# Patient Record
Sex: Male | Born: 1960 | Race: White | Hispanic: No | Smoking: Never smoker
Health system: Southern US, Community
[De-identification: ages and names within clinical notes are randomized; demographics above are authoritative.]

## PROBLEM LIST (undated history)

## (undated) DIAGNOSIS — R51 Headache: Secondary | ICD-10-CM

## (undated) DIAGNOSIS — K760 Fatty (change of) liver, not elsewhere classified: Secondary | ICD-10-CM

## (undated) DIAGNOSIS — H409 Unspecified glaucoma: Secondary | ICD-10-CM

## (undated) DIAGNOSIS — K219 Gastro-esophageal reflux disease without esophagitis: Secondary | ICD-10-CM

## (undated) DIAGNOSIS — R739 Hyperglycemia, unspecified: Secondary | ICD-10-CM

## (undated) DIAGNOSIS — M1990 Unspecified osteoarthritis, unspecified site: Secondary | ICD-10-CM

## (undated) DIAGNOSIS — F40240 Claustrophobia: Secondary | ICD-10-CM

## (undated) DIAGNOSIS — E785 Hyperlipidemia, unspecified: Secondary | ICD-10-CM

## (undated) DIAGNOSIS — H269 Unspecified cataract: Secondary | ICD-10-CM

## (undated) DIAGNOSIS — I10 Essential (primary) hypertension: Secondary | ICD-10-CM

## (undated) DIAGNOSIS — H543 Unqualified visual loss, both eyes: Secondary | ICD-10-CM

## (undated) DIAGNOSIS — R7303 Prediabetes: Secondary | ICD-10-CM

## (undated) DIAGNOSIS — C801 Malignant (primary) neoplasm, unspecified: Secondary | ICD-10-CM

## (undated) DIAGNOSIS — G4730 Sleep apnea, unspecified: Secondary | ICD-10-CM

## (undated) HISTORY — PX: FINGER SURGERY: SHX640

## (undated) HISTORY — DX: Sleep apnea, unspecified: G47.30

## (undated) HISTORY — DX: Hyperlipidemia, unspecified: E78.5

## (undated) HISTORY — DX: Fatty (change of) liver, not elsewhere classified: K76.0

## (undated) HISTORY — DX: Essential (primary) hypertension: I10

## (undated) HISTORY — DX: Unspecified cataract: H26.9

## (undated) HISTORY — DX: Hyperglycemia, unspecified: R73.9

## (undated) HISTORY — PX: EYE SURGERY: SHX253

---

## 2011-03-06 ENCOUNTER — Other Ambulatory Visit: Payer: Self-pay | Admitting: Ophthalmology

## 2011-03-06 ENCOUNTER — Encounter: Payer: Self-pay | Admitting: Ophthalmology

## 2011-03-08 ENCOUNTER — Other Ambulatory Visit: Payer: Self-pay

## 2011-03-08 ENCOUNTER — Encounter (HOSPITAL_COMMUNITY)
Admission: RE | Admit: 2011-03-08 | Discharge: 2011-03-08 | Disposition: A | Discharge status: Home / Self Care | Payer: BC Managed Care – PPO | Source: Ambulatory Visit | Attending: Ophthalmology | Admitting: Ophthalmology

## 2011-03-08 ENCOUNTER — Encounter (HOSPITAL_COMMUNITY): Payer: Self-pay | Admitting: Pharmacy Technician

## 2011-03-08 ENCOUNTER — Encounter (HOSPITAL_COMMUNITY): Payer: Self-pay

## 2011-03-08 HISTORY — DX: Unspecified glaucoma: H40.9

## 2011-03-08 HISTORY — DX: Headache: R51

## 2011-03-08 LAB — BASIC METABOLIC PANEL
Calcium: 9.5 mg/dL (ref 8.4–10.5)
Creatinine, Ser: 0.92 mg/dL (ref 0.50–1.35)
GFR calc Af Amer: >90 mL/min (ref >90)

## 2011-03-08 LAB — CBC
Hemoglobin: 14.9 g/dL (ref 13.0–17.0)
MCHC: 34.4 g/dL (ref 30.0–36.0)
RDW: 13.4 % (ref 11.5–15.5)
WBC: 8.6 10*3/uL (ref 4.0–10.5)

## 2011-03-08 LAB — SURGICAL PCR SCREEN
MRSA, PCR: NEGATIVE
Staphylococcus aureus: NEGATIVE

## 2011-03-08 MED ORDER — GATIFLOXACIN 0.5 % OP SOLN
1.0000 [drp] | OPHTHALMIC | Status: AC
  Administered 2011-03-09 (×3): 1 [drp] via OPHTHALMIC

## 2011-03-08 MED ORDER — TETRACAINE HCL 0.5 % OP SOLN
1.0000 [drp] | OPHTHALMIC | Status: AC
  Administered 2011-03-09 (×3): 1 [drp] via OPHTHALMIC
  Filled 2011-03-08: qty 2

## 2011-03-08 NOTE — Pre-Procedure Instructions (Signed)
[address]  03/08/2011   Your procedure is scheduled on:  03/09/11  Report to [facility] at 1;00 pm  Call this number if you have problems the morning of surgery: [phone]   Remember:   Do not eat food:After Midnight.  May have clear liquids: up to 4 Hours before arrival.  Clear liquids include soda, tea, black coffee, apple or grape juice, broth.  Take these medicines the morning of surgery with A SIP OF WATER: all eye drops   Do not wear jewelry, make-up or nail polish.  Do not wear lotions, powders, or perfumes. You may wear deodorant.  Do not shave 48 hours prior to surgery.  Do not bring valuables to the hospital.  Contacts, dentures or bridgework may not be worn into surgery.  Leave suitcase in the car. After surgery it may be brought to your room.  For patients admitted to the hospital, checkout time is 11:00 AM the day of discharge.   Patients discharged the day of surgery will not be allowed to drive home.  Name and phone number of your driver: family  Special Instructions: CHG Shower Use Special Wash: 1/2 bottle night before surgery and 1/2 bottle morning of surgery.   Please read over the following fact sheets that you were given: Pain Booklet, MRSA Information and Surgical Site Infection Prevention

## 2011-03-09 ENCOUNTER — Encounter (HOSPITAL_COMMUNITY): Payer: Self-pay | Admitting: *Deleted

## 2011-03-09 ENCOUNTER — Encounter (HOSPITAL_COMMUNITY): Payer: Self-pay | Admitting: Certified Registered"

## 2011-03-09 ENCOUNTER — Ambulatory Visit (HOSPITAL_COMMUNITY)
Admission: RE | Admit: 2011-03-09 | Discharge: 2011-03-09 | Disposition: A | Discharge status: Home / Self Care | Payer: BC Managed Care – PPO | Source: Ambulatory Visit | Attending: Ophthalmology | Admitting: Ophthalmology

## 2011-03-09 ENCOUNTER — Encounter (HOSPITAL_COMMUNITY)
Admission: RE | Disposition: A | Discharge status: Home / Self Care | Payer: Self-pay | Source: Ambulatory Visit | Attending: Ophthalmology

## 2011-03-09 ENCOUNTER — Ambulatory Visit (HOSPITAL_COMMUNITY): Payer: BC Managed Care – PPO | Admitting: Certified Registered"

## 2011-03-09 ENCOUNTER — Ambulatory Visit (HOSPITAL_COMMUNITY): Payer: BC Managed Care – PPO

## 2011-03-09 DIAGNOSIS — R51 Headache: Secondary | ICD-10-CM | POA: Insufficient documentation

## 2011-03-09 DIAGNOSIS — Z01810 Encounter for preprocedural cardiovascular examination: Secondary | ICD-10-CM | POA: Insufficient documentation

## 2011-03-09 DIAGNOSIS — I10 Essential (primary) hypertension: Secondary | ICD-10-CM | POA: Insufficient documentation

## 2011-03-09 DIAGNOSIS — H4010X0 Unspecified open-angle glaucoma, stage unspecified: Secondary | ICD-10-CM | POA: Insufficient documentation

## 2011-03-09 DIAGNOSIS — H4011X0 Primary open-angle glaucoma, stage unspecified: Secondary | ICD-10-CM

## 2011-03-09 DIAGNOSIS — Z01812 Encounter for preprocedural laboratory examination: Secondary | ICD-10-CM | POA: Insufficient documentation

## 2011-03-09 HISTORY — PX: MINI SHUNT INSERTION: SHX5337

## 2011-03-09 SURGERY — INSERTION OF MINI SHUNT
Anesthesia: Monitor Anesthesia Care | Site: Eye | Laterality: Left | Wound class: Clean

## 2011-03-09 SURGERY — INSERTION OF MINI SHUNT
Anesthesia: Monitor Anesthesia Care | Laterality: Left

## 2011-03-09 MED ORDER — HYALURONIDASE HUMAN 150 UNIT/ML IJ SOLN
INTRAMUSCULAR | Status: DC | PRN
  Administered 2011-03-09: 150 [IU] via SUBCUTANEOUS

## 2011-03-09 MED ORDER — FENTANYL CITRATE 0.05 MG/ML IJ SOLN
INTRAMUSCULAR | Status: DC | PRN
  Administered 2011-03-09 (×5): 25 ug via INTRAVENOUS
  Administered 2011-03-09: 100 ug via INTRAVENOUS
  Administered 2011-03-09: 25 ug via INTRAVENOUS

## 2011-03-09 MED ORDER — MITOMYCIN-C INJECTION USE IN OR ONLY (0.4 MG/ML)
INTRAVENOUS | Status: DC | PRN
  Administered 2011-03-09: .4 mg via OPHTHALMIC

## 2011-03-09 MED ORDER — PROVISC 10 MG/ML IO SOLN
INTRAOCULAR | Status: DC | PRN
  Administered 2011-03-09: 8.5 mg via INTRAOCULAR

## 2011-03-09 MED ORDER — PROPOFOL 10 MG/ML IV EMUL
INTRAVENOUS | Status: DC | PRN
  Administered 2011-03-09: 50 mg via INTRAVENOUS
  Administered 2011-03-09: 40 mg via INTRAVENOUS

## 2011-03-09 MED ORDER — SODIUM CHLORIDE 0.9 % IV SOLN
INTRAVENOUS | Status: DC | PRN
  Administered 2011-03-09 (×2): via INTRAVENOUS

## 2011-03-09 MED ORDER — BUPIVACAINE HCL 0.75 % IJ SOLN
INTRAMUSCULAR | Status: DC | PRN
  Administered 2011-03-09: 10 mL

## 2011-03-09 MED ORDER — BSS IO SOLN
INTRAOCULAR | Status: DC | PRN
  Administered 2011-03-09: 500 mL via INTRAOCULAR

## 2011-03-09 MED ORDER — MIDAZOLAM HCL 5 MG/5ML IJ SOLN
INTRAMUSCULAR | Status: DC | PRN
  Administered 2011-03-09: 2 mg via INTRAVENOUS

## 2011-03-09 MED ORDER — GATIFLOXACIN 0.5 % OP SOLN
OPHTHALMIC | Status: AC
  Administered 2011-03-09: 1 [drp] via OPHTHALMIC
  Filled 2011-03-09: qty 2.5

## 2011-03-09 MED ORDER — HEMOSTATIC AGENTS (NO CHARGE) OPTIME
TOPICAL | Status: DC | PRN
  Administered 2011-03-09: 1 via TOPICAL

## 2011-03-09 MED ORDER — ONDANSETRON HCL 4 MG/2ML IJ SOLN: 4.0000 mg | Freq: Once | INTRAMUSCULAR | Status: DC | PRN

## 2011-03-09 MED ORDER — TRIAMCINOLONE ACETONIDE 40 MG/ML IJ SUSP
INTRAMUSCULAR | Status: DC | PRN
  Administered 2011-03-09: .1 mL

## 2011-03-09 MED ORDER — SODIUM CHLORIDE 0.9 % IV SOLN
INTRAVENOUS | Status: DC
  Administered 2011-03-09: 15:00:00 via INTRAVENOUS

## 2011-03-09 MED ORDER — TETRACAINE HCL 0.5 % OP SOLN
OPHTHALMIC | Status: DC | PRN
  Administered 2011-03-09: 8 [drp] via OPHTHALMIC

## 2011-03-09 MED ORDER — FLUORESCEIN SODIUM 1 MG OP STRP
ORAL_STRIP | OPHTHALMIC | Status: DC | PRN
  Administered 2011-03-09: 1 via OPHTHALMIC

## 2011-03-09 MED ORDER — HYDROMORPHONE HCL PF 1 MG/ML IJ SOLN: 0.2500 mg | INTRAMUSCULAR | Status: DC | PRN

## 2011-03-09 MED ORDER — BSS IO SOLN
INTRAOCULAR | Status: DC | PRN
  Administered 2011-03-09: 15 mL via INTRAOCULAR

## 2011-03-09 MED ORDER — LIDOCAINE-EPINEPHRINE 2 %-1:100000 IJ SOLN
INTRAMUSCULAR | Status: DC | PRN
  Administered 2011-03-09: 10 mL

## 2011-03-09 MED ORDER — MITOMYCIN-C INJECTION USE IN OR ONLY (0.4 MG/ML): 0.5000 mL | Freq: Once | INTRAVENOUS | Status: DC

## 2011-03-09 SURGICAL SUPPLY — 51 items
APPLICATOR COTTON TIP 6IN STRL (MISCELLANEOUS) ×2 IMPLANT
APPLICATOR DR MATTHEWS STRL (MISCELLANEOUS) IMPLANT
BLADE EYE CATARACT 19 1.4 BEAV (BLADE) ×2 IMPLANT
BLADE STAB KNIFE 45DEG (BLADE) ×2 IMPLANT
BLADE SURG 15 STRL LF DISP TIS (BLADE) IMPLANT
BLADE SURG 15 STRL SS (BLADE)
CANISTER SUCTION 2500CC (MISCELLANEOUS) ×2 IMPLANT
CLOTH BEACON ORANGE TIMEOUT ST (SAFETY) ×2 IMPLANT
CORDS BIPOLAR (ELECTRODE) ×2 IMPLANT
DRAPE OPHTHALMIC 40X48 W POUCH (DRAPES) ×2 IMPLANT
DRAPE RETRACTOR (MISCELLANEOUS) ×2 IMPLANT
ERASER HMR WETFIELD 23G BP (MISCELLANEOUS) ×2 IMPLANT
EYE SHIELD UNIVERSAL CLEAR (GAUZE/BANDAGES/DRESSINGS) ×2 IMPLANT
GLOVE BIO SURGEON STRL SZ8 (GLOVE) ×2 IMPLANT
GLOVE BIO SURGEON STRL SZ8.5 (GLOVE) IMPLANT
GLOVE ECLIPSE 7.0 STRL STRAW (GLOVE) ×2 IMPLANT
GLOVE ECLIPSE 7.5 STRL STRAW (GLOVE) ×2 IMPLANT
GLOVE SURG SS PI 6.5 STRL IVOR (GLOVE) ×4 IMPLANT
GOWN PREVENTION PLUS XLARGE (GOWN DISPOSABLE) ×2 IMPLANT
GOWN STRL NON-REIN LRG LVL3 (GOWN DISPOSABLE) ×6 IMPLANT
KIT BASIN OR (CUSTOM PROCEDURE TRAY) ×2 IMPLANT
KIT ROOM TURNOVER OR (KITS) ×2 IMPLANT
KNIFE GRIESHABER SHARP 2.5MM (MISCELLANEOUS) ×2 IMPLANT
MARKER SKIN DUAL TIP RULER LAB (MISCELLANEOUS) IMPLANT
NEEDLE 22X1 1/2 (OR ONLY) (NEEDLE) ×2 IMPLANT
NEEDLE 25GX 5/8IN NON SAFETY (NEEDLE) IMPLANT
NEEDLE HYPO 23GX1 LL BLUE HUB (NEEDLE) IMPLANT
NEEDLE HYPO 30X.5 LL (NEEDLE) ×2 IMPLANT
NS IRRIG 1000ML POUR BTL (IV SOLUTION) ×2 IMPLANT
PACK CATARACT CUSTOM (CUSTOM PROCEDURE TRAY) ×2 IMPLANT
PAD ARMBOARD 7.5X6 YLW CONV (MISCELLANEOUS) ×2 IMPLANT
PAD EYE OVAL STERILE LF (GAUZE/BANDAGES/DRESSINGS) ×2 IMPLANT
SHUNT EXPRS GLAUCOMA MINI P200 (Intraocular Lens) ×2 IMPLANT
SPEAR EYE SURG WECK-CEL (MISCELLANEOUS) IMPLANT
SPECIMEN JAR SMALL (MISCELLANEOUS) IMPLANT
SPONGE SURGIFOAM ABS GEL 12-7 (HEMOSTASIS) ×4 IMPLANT
SUT ETHILON 10 0 CS140 6 (SUTURE) ×2 IMPLANT
SUT ETHILON 9 0 BV100 4 (SUTURE) ×2 IMPLANT
SUT ETHILON 9 0 TG140 8 (SUTURE) IMPLANT
SUT MERSILENE 6 0 S14 DA (SUTURE) IMPLANT
SUT SILK 6 0 G 6 (SUTURE) ×2 IMPLANT
SUT VICRYL 8 0 TG140 8 (SUTURE) ×2 IMPLANT
SUT VICRYL 9-0 (SUTURE) ×2 IMPLANT
SYR 50ML SLIP (SYRINGE) ×2 IMPLANT
SYR TB 1ML LUER SLIP (SYRINGE) IMPLANT
TAPE SURG TRANSPORE 1 IN (GAUZE/BANDAGES/DRESSINGS) ×1 IMPLANT
TAPE SURGICAL TRANSPORE 1 IN (GAUZE/BANDAGES/DRESSINGS) ×1
TOWEL OR 17X24 6PK STRL BLUE (TOWEL DISPOSABLE) ×4 IMPLANT
TUBE CONNECTING 12X1/4 (SUCTIONS) IMPLANT
WATER STERILE IRR 1000ML POUR (IV SOLUTION) ×2 IMPLANT
WIPE INSTRUMENT VISIWIPE 73X73 (MISCELLANEOUS) ×2 IMPLANT

## 2011-03-09 NOTE — Op Note (Signed)
Operative note Preoperative diagnosis: Uncontrolled open-angle glaucoma left eye severely elevated intraocular  pressure measured 54 mmHg yesterday Postoperative diagnosis: Same Procedure express mini -shunt with mitomycin-C left eye Complications: None Asst. Mindy Anesthesia: 2% Xylocaine in a 50-50 mixture of 0.75% Marcaine with Wydase Procedure: The patient was taken to the operating room where he was given a peribulbar block with the aforementioned local anesthetic agent. Following this the patient's face was prepped and draped in the usual sterile fashion. With the surgeons in superiorly and the operating microscope in position with 6-0 nylon suture was passed through clear cornea for about the eye. It was noted that the patient's conjunctiva was closed injected an incision was made at the superior nasal limbus using the Wescott scissors and Bishop Harmon forceps. The block sutures were used to dissect to form a fornix based conjunctival flap blade was used to assess the non-fibers. Bleeding was controlled with cautery a half-thickness scleral flap was fashioned with a 45 blade the bruit forceps were used to elevate the flap and a 700 blade was used to dissect to the limbus following this mitomycin-C 0.4 mg as per cc was placed on Gelfoam sponge allowed to stay on the conjunctiva for 2 minutes and then irrigated with 50 cc of balanced salt solution. Following thisa paracentesis was and at the 4:00 position and Provisc was injected into the anterior chamber. The scleral flap was elevated and a 26-gauge needle was passed into the anterior chamber at the by injecting progress. We expressed. Preloaded P.-200 was then passed into the anterior chamber and positioned the scleral flap was then sutured with 4 interrupted 10-0 nylon sutures. The conjunctiva was then closed with 1 9-0 nylon suture and a lateral Vicryl suture on a 100 needle to achieve watertight closure appeared BSS was then injected into the  anterior chamber the bleb elevated in all incision areas were Seidel negative. A subconjunctival injection of Kenalog 4 mg was given in the inferior subconjunctival. The 6-0 nylon suture was removed all instrumentation was removed from the eye. Topical TobraDex ointment was applied. A patch and Fox your were placed and the patient returned to recovery area in stable condition Complications none

## 2011-03-09 NOTE — H&P (Signed)
Pre-operative History and Physical for Ophthalmic Surgery  Patient Name: Jeff [name]  Date of H&P: 03/09/2011   Chief Complaint: Elevated IOP OS  Diagnosis: Primary open angle glaucoma uncontrolled left eye  No Known Allergies  Planned procedure:  Minitube express shunt with [facility]MC OS  ROS: Review of systems is negative except as noted Hypertension, arthritis,  migraine   Past Medical History  Diagnosis Date  . Hypertension     no meds  . Headache   . Glaucoma (increased eye pressure)      Past Surgical History  Procedure Date  . No past surgeries       History   Social History  . Marital Status: Married    Spouse Name: N/A    Number of Children: N/A  . Years of Education: N/A   Occupational History  . Not on file.   Social History Main Topics  . Smoking status: Never Smoker   . Smokeless tobacco: Not on file  . Alcohol Use: 3.0 oz/week    5 Cans of beer per week     daily  . Drug Use: No  . Sexually Active:    Other Topics Concern  . Not on file   Social History Narrative  . No narrative on file    PHYSICAL EXAM  The following examination is for anesthesia clearance for minimally invasive Ophthalmic surgery. It is primarily to document heart and lung findings and is not intended to elucidate unknown general medical conditions inclusive of abdominal masses, lung lesions, etc.   Filed Vitals:   03/09/11 1311  BP: 167/111  Pulse: 85  Temp: 97.8 F (36.6 C)  Resp: 22    General Constitution: Well-nourished white male, oriented to time place and person with normal mood   HEENT: -Right Eye Findings: va 20/25  Ta 26  Optic nerve 80% cupped SLE normal -Left Eye Findings: VA 20/70   Ta 54  + corneal edema , optic nerve 90% cupped, macula normal   Neck: Supple no masses   Chest/Lungs: Clear to auscultation and percussion   Cardiac: Regular rhythm rate with no murmur   Abdominal: Soft normal bowel sounds   Neuro: All cranial nerves intact    Other pertinent findings: None   IMPRESSION: Uncontrolled glaucoma left eye despite maxium tolerated medical and laser therapy  PLANNED PROCEDURE: Express min-tube Left Eye with [facility]    Harlon Flor, Honestii Marton, MD 03/09/2011 1:52 PM

## 2011-03-09 NOTE — Preoperative (Signed)
Beta Blockers   Reason not to administer Beta Blockers:Not Applicable 

## 2011-03-09 NOTE — Anesthesia Postprocedure Evaluation (Signed)
  Anesthesia Post-op Note  Patient: Jeff [name]  Procedure(s) Performed:  INSERTION OF MINI SHUNT  Patient Location: PACU  Anesthesia Type: MAC  Level of Consciousness: awake, alert , oriented and patient cooperative  Airway and Oxygen Therapy: Patient Spontanous Breathing and Patient connected to nasal cannula oxygen  Post-op Pain: none  Post-op Assessment: Post-op Vital signs reviewed, Patient's Cardiovascular Status Stable, Respiratory Function Stable, Patent Airway, No signs of Nausea or vomiting and Pain level controlled  Post-op Vital Signs: stable  Complications: No apparent anesthesia complications

## 2011-03-09 NOTE — Transfer of Care (Signed)
Immediate Anesthesia Transfer of Care Note  Patient: Jeff [name]  Procedure(s) Performed:  INSERTION OF MINI SHUNT  Patient Location: Short Stay  Anesthesia Type: MAC  Level of Consciousness: awake, alert , oriented and patient cooperative  Airway & Oxygen Therapy: Patient Spontanous Breathing  Post-op Assessment: Report given to PACU RN, Post -op Vital signs reviewed and stable and Patient moving all extremities  Post vital signs: Reviewed and stable Filed Vitals:   03/09/11 1330  BP: 159/105  Pulse:   Temp:   Resp:     Complications: No apparent anesthesia complications

## 2011-03-09 NOTE — Anesthesia Preprocedure Evaluation (Addendum)
Anesthesia Evaluation  Patient identified by MRN, date of birth, ID band Patient awake    Reviewed: Allergy & Precautions, H&P , NPO status , Patient's Chart, lab work & pertinent test results, reviewed documented beta blocker date and time   Airway Mallampati: I TM Distance: >3 FB Neck ROM: Full  Mouth opening: Limited Mouth Opening  Dental  (+) Teeth Intact   Pulmonary neg pulmonary ROS,          Cardiovascular hypertension, regular Normal No longer being treated for HTN   Neuro/Psych  Headaches, Negative Psych ROS   GI/Hepatic negative GI ROS, Neg liver ROS,   Endo/Other  Negative Endocrine ROS  Renal/GU negative Renal ROS  Genitourinary negative   Musculoskeletal   Abdominal   Peds  Hematology negative hematology ROS (+)   Anesthesia Other Findings   Reproductive/Obstetrics                          Anesthesia Physical Anesthesia Plan  ASA: II  Anesthesia Plan: MAC   Post-op Pain Management:    Induction: Intravenous  Airway Management Planned: Mask  Additional Equipment:   Intra-op Plan:   Post-operative Plan:   Informed Consent: I have reviewed the patients History and Physical, chart, labs and discussed the procedure including the risks, benefits and alternatives for the proposed anesthesia with the patient or authorized representative who has indicated his/her understanding and acceptance.     Plan Discussed with: Anesthesiologist, CRNA and Surgeon  Anesthesia Plan Comments:         Anesthesia Quick Evaluation

## 2011-03-09 NOTE — Progress Notes (Signed)
Spoke with Shonna Chock PA regarding EKG and hx of HTN-BP at preop and twice checked today. Order put in for CXR.  Patient states he had EKG done in 2011 at [facility]- will try to call and get this faxed.

## 2011-03-14 ENCOUNTER — Encounter (HOSPITAL_COMMUNITY): Payer: Self-pay | Admitting: Ophthalmology

## 2011-03-20 ENCOUNTER — Other Ambulatory Visit: Payer: Self-pay | Admitting: Ophthalmology

## 2011-03-22 ENCOUNTER — Ambulatory Visit (INDEPENDENT_AMBULATORY_CARE_PROVIDER_SITE_OTHER): Payer: BC Managed Care – PPO | Admitting: Family Medicine

## 2011-03-22 VITALS — BP 149/102 | HR 71 | Temp 98.2°F | Resp 16 | Ht 70.0 in | Wt 179.0 lb

## 2011-03-22 DIAGNOSIS — S01501A Unspecified open wound of lip, initial encounter: Secondary | ICD-10-CM

## 2011-03-22 DIAGNOSIS — H409 Unspecified glaucoma: Secondary | ICD-10-CM | POA: Insufficient documentation

## 2011-03-22 DIAGNOSIS — Z23 Encounter for immunization: Secondary | ICD-10-CM

## 2011-03-22 DIAGNOSIS — I10 Essential (primary) hypertension: Secondary | ICD-10-CM

## 2011-03-22 DIAGNOSIS — S01511A Laceration without foreign body of lip, initial encounter: Secondary | ICD-10-CM

## 2011-03-22 MED ORDER — DOXYCYCLINE HYCLATE 100 MG PO TABS: 100.0000 mg | ORAL_TABLET | Freq: Two times a day (BID) | ORAL | Status: AC

## 2011-03-22 MED ORDER — TRAMADOL HCL 50 MG PO TABS: 50.0000 mg | ORAL_TABLET | Freq: Three times a day (TID) | ORAL | Status: DC | PRN

## 2011-03-22 NOTE — Progress Notes (Signed)
  Subjective:    Patient ID: Jeff [name], male    DOB: 17-Jan-1961, 51 y.o.   MRN: [id number]  HPI   Dog bite to lower lip last night at 5:30 pm.  Overnight with swelling. Unknown Tdap status.    Review of Systems No drainage from wound    Objective:   Physical Exam  Constitutional: He appears well-developed.  HENT:  Mouth/Throat: Oropharynx is clear and moist.    Cardiovascular: Normal rate and regular rhythm.   Pulmonary/Chest: Effort normal and breath sounds normal.          Assessment & Plan:   1. Laceration of lip  doxycycline (VIBRA-TABS) 100 MG tablet  2. HTN (hypertension)     Wound care sheet provided. Follow up for suture removal 5 days.

## 2011-03-22 NOTE — Patient Instructions (Signed)
Open Wound, Lip  Return to clinic in 5 days for suture removal, sooner if worse.  An open wound is a break in the skin caused by an injury. An open wound to the lip can be a scrape, cut, or hole (puncture). Good wound care will help:   Lessen pain.   Prevent infection.   Lessen scarring.  HOME CARE  Wash off all dirt.   Clean your wounds daily with gentle soap and water.   Eat soft foods or liquids while your wound is healing.   Rinse the wound with salt water after each meal.   Apply medicated cream after the wound has been cleaned as told by your doctor.   Follow up with your doctor as told.  GET HELP RIGHT AWAY IF:   There is more redness or puffiness (swelling) in or around the wound.   There is increasing pain.   You have a temperature by mouth above 102 F (38.9 C), not controlled by medicine.   Your baby is older than 3 months with a rectal temperature of 102 F (38.9 C) or higher.   Your baby is 43 months old or younger with a rectal temperature of 100.4 F (38 C) or higher.   There is yellowish white fluid (pus) coming from the wound.   Very bad pain develops that is not controlled with medicine.   There is a red line on the skin above or below the wound.  MAKE SURE YOU:   Understand these instructions.   Will watch this condition.   Will get help right away if you are not doing well or get worse.  Document Released: 05/05/2008 Document Revised: 10/19/2010 Document Reviewed: 05/25/2009 [facility] Patient Information 2012 [place], [place].

## 2011-03-22 NOTE — Progress Notes (Signed)
   Patient ID: Jeff [name] MRN: [id number], DOB: 1960-04-28, 51 y.o. Date of Encounter: 03/22/2011, 4:46 PM  The following procedure performed and documented by: Eula Listen, PA-C.  PROCEDURE NOTE: Verbal consent obtained. Sterile technique employed. Numbing: Anesthesia obtained with 2% plain lidocaine 3 cc total. Cleansed with soap and water. Irrigated. Betadine prep per usual protocol.  Wound explored, no deep structures involved, no foreign bodies.   Wound repaired with # 7 SI 7-0 Ethilon. Hemostasis obtained. Wound cleansed. Wound care instructions including precautions covered with patient. Handout given.  Anticipate suture removal in 5 days.  Elinor Dodge, PA-C 03/22/2011

## 2011-03-27 ENCOUNTER — Encounter: Payer: Self-pay | Admitting: Family Medicine

## 2011-03-27 ENCOUNTER — Ambulatory Visit (INDEPENDENT_AMBULATORY_CARE_PROVIDER_SITE_OTHER): Payer: BC Managed Care – PPO | Admitting: Family Medicine

## 2011-03-27 VITALS — BP 154/96 | HR 80 | Temp 98.4°F | Resp 16

## 2011-03-27 DIAGNOSIS — S01501A Unspecified open wound of lip, initial encounter: Secondary | ICD-10-CM | POA: Insufficient documentation

## 2011-03-27 DIAGNOSIS — S0180XA Unspecified open wound of other part of head, initial encounter: Secondary | ICD-10-CM

## 2011-03-27 NOTE — Progress Notes (Signed)
  Subjective:    Patient ID: Jeff [name], male    DOB: October 05, 1960, 51 y.o.   MRN: [id number]  HPI It is here to have his sutures removed. He had been put in last Wednesday after his dog clawed his lower lip. It was not a malicious event.   Review of Systems It is hurts him no major complaint no associated problem    Objective:   Physical Exam    Right age of the lower lip has a eschar  on it. The patient says they're supposed to be 7 sutures in there. These were searched for. I was able to remove for sutures and may be a fifth. It is a little hard to tell because everything is so embedded in the residual scab. The wound on the buccal mucosa aspect is healing with a fairly wide gap, but has new tissue growing. The scab which is on the outer aspect is still tightly adherent. I cautioned him that to pull the scab loose would pull the stitches out..    Assessment & Plan:  Wound lower lip. He is to soak it and tried work the edges of the scab loose. He is then to return Thursday or Friday for one of [place] to try to remove the remaining sutures. He is to ask for either Eula Listen or myself.  Note that his blood pressure was on this visit and needs to be rechecked when he comes in

## 2011-03-31 ENCOUNTER — Ambulatory Visit (INDEPENDENT_AMBULATORY_CARE_PROVIDER_SITE_OTHER): Payer: BC Managed Care – PPO | Admitting: Physician Assistant

## 2011-03-31 VITALS — BP 156/104 | HR 77 | Temp 98.1°F | Resp 16

## 2011-03-31 DIAGNOSIS — Z4802 Encounter for removal of sutures: Secondary | ICD-10-CM

## 2011-03-31 NOTE — Progress Notes (Signed)
Patient ID: Jeff [name] MRN: [id number], DOB: 07-10-1960 51 y.o. Date of Encounter: 03/31/2011, 3:02 PM  Primary Physician: No primary provider on file.  Chief Complaint: Suture removal    See note from 1/30 and 2/4  HPI: 51 y.o. y/o male with injury to lower lip from his dog. Not malicious.  Here for suture removal s/p placement on 03/22/11 Had # 5 sutures removed by Dr. Alwyn Ren on 03/27/11. The remaining 2 sutures were underneath eschar. Since the visit on 2/4 the patient hs been removing the eschar at home.  Doing well No issues/complaints Afebrile/ No chills No erythema No pain Able to move without difficulty Normal sensation  Attempted to discuss BP issues today. Patient however states his wife is waiting for him in the waiting room and they have friends coming over. He is asymptomatic. Declines both repeat BP check today, and evaluation and treatment of his elevated BP.   Past Medical History  Diagnosis Date  . Hypertension     no meds  . Headache   . Glaucoma (increased eye pressure)      Home Meds: Prior to Admission medications   Medication Sig Start Date End Date Taking? Authorizing Provider  brimonidine-timolol (COMBIGAN) 0.2-0.5 % ophthalmic solution Place 1 drop into both eyes every 12 (twelve) hours.   Yes Historical Provider, MD  brinzolamide (AZOPT) 1 % ophthalmic suspension Place 1 drop into both eyes 3 (three) times daily.   Yes Historical Provider, MD  doxycycline (VIBRA-TABS) 100 MG tablet Take 1 tablet (100 mg total) by mouth 2 (two) times daily. 03/22/11 04/01/11 Yes Karen L. Hal Hope, MD  gatifloxacin (ZYMAXID) 0.5 % SOLN Apply 1 drop to eye 4 (four) times daily.   Yes Historical Provider, MD  ibuprofen (ADVIL,MOTRIN) 200 MG tablet Take 500 mg by mouth 2 (two) times daily as needed. For headache   Yes Historical Provider, MD  ibuprofen (ADVIL,MOTRIN) 600 MG tablet Take 600 mg by mouth 3 (three) times daily.   Yes Historical Provider, MD  Multiple  Vitamins-Minerals (MULTIVITAMINS THER. W/MINERALS) TABS Take 1 tablet by mouth daily.   Yes Historical Provider, MD  prednisoLONE acetate (PRED FORTE) 1 % ophthalmic suspension Place 1 drop into both eyes 4 (four) times daily.   Yes Historical Provider, MD  traMADol (ULTRAM) 50 MG tablet Take 1 tablet (50 mg total) by mouth every 8 (eight) hours as needed for pain. 03/22/11 04/01/11 Yes Karen L. Hal Hope, MD  HYDROcodone-acetaminophen (NORCO) 10-325 MG per tablet Take 1 tablet by mouth as needed. Pt taking 1 tablet QHS PRN    Historical Provider, MD    Allergies: No Known Allergies  Physical Exam: Blood pressure 156/104, pulse 77, temperature 98.1 F (36.7 C), resp. rate 16. General: Well developed, well nourished, in no acute distress. Head: Normocephalic, atraumatic, sclera non-icteric, no xanthomas, nares are without discharge.  Neck: Supple. Lungs: Breathing is unlabored. Heart: Normal rate. Msk:  Strength and tone appear normal for age. Wound: Lower lip wound well healed without erythema, swelling, or tenderness to palpation. No signs of secondary infection. Skin: See above, otherwise dry without rash or erythema. Extremities: No clubbing or cyanosis. No edema. Neuro: Alert and oriented X 3. Moves all extremities spontaneously.  Psych:  Responds to questions appropriately with a normal affect.   PROCEDURE: Verbal consent obtained. # 2 sutures removed without difficulty. Wound examined revealing no further sutures in place.  Assessment and Plan: 51 y.o. y/o male here for suture removal for wound described above and elevated  BP readings. -Sutures removed per above -Wound resolved -Advised patient of the risks of untreated HTN. He declines evaluation and treatment. Advised patient to have BP checks at local stores, if >140/90 needs to RTC for evaluation and treatment.   Elinor Dodge, PA-C 03/31/2011 3:10 PM

## 2011-10-15 ENCOUNTER — Ambulatory Visit: Payer: BC Managed Care – PPO

## 2011-10-15 ENCOUNTER — Ambulatory Visit (INDEPENDENT_AMBULATORY_CARE_PROVIDER_SITE_OTHER): Payer: BC Managed Care – PPO | Admitting: Family Medicine

## 2011-10-15 VITALS — BP 170/105 | HR 89 | Temp 98.0°F | Resp 16 | Ht 71.0 in | Wt 187.0 lb

## 2011-10-15 DIAGNOSIS — S0990XA Unspecified injury of head, initial encounter: Secondary | ICD-10-CM

## 2011-10-15 DIAGNOSIS — M25519 Pain in unspecified shoulder: Secondary | ICD-10-CM

## 2011-10-15 DIAGNOSIS — M25539 Pain in unspecified wrist: Secondary | ICD-10-CM

## 2011-10-15 DIAGNOSIS — S63259A Unspecified dislocation of unspecified finger, initial encounter: Secondary | ICD-10-CM

## 2011-10-15 DIAGNOSIS — M542 Cervicalgia: Secondary | ICD-10-CM

## 2011-10-15 DIAGNOSIS — M79646 Pain in unspecified finger(s): Secondary | ICD-10-CM

## 2011-10-15 DIAGNOSIS — I10 Essential (primary) hypertension: Secondary | ICD-10-CM

## 2011-10-15 DIAGNOSIS — S6990XA Unspecified injury of unspecified wrist, hand and finger(s), initial encounter: Secondary | ICD-10-CM

## 2011-10-15 DIAGNOSIS — M109 Gout, unspecified: Secondary | ICD-10-CM

## 2011-10-15 DIAGNOSIS — M79609 Pain in unspecified limb: Secondary | ICD-10-CM

## 2011-10-15 MED ORDER — LISINOPRIL-HYDROCHLOROTHIAZIDE 10-12.5 MG PO TABS: 1.0000 | ORAL_TABLET | Freq: Every day | ORAL | Status: DC

## 2011-10-15 MED ORDER — IBUPROFEN 600 MG PO TABS: 600.0000 mg | ORAL_TABLET | Freq: Three times a day (TID) | ORAL | Status: DC

## 2011-10-15 MED ORDER — HYDROCODONE-ACETAMINOPHEN 10-325 MG PO TABS: 1.0000 | ORAL_TABLET | Freq: Four times a day (QID) | ORAL | Status: DC | PRN

## 2011-10-15 NOTE — Progress Notes (Signed)
[facility]:  Office Visit  Chief Complaint:  Chief Complaint  Patient presents with  . Head Injury    patient fell down stairs lastnight around 1am.  . Wrist Injury    Right  . Hand Injury    Right    HPI: Jeff [name] is a 51 y.o. male who complains of diffuse body pain. Was at a birthday party at cousin. Stayed overnight. Went to restroom and opened wrong door. Tumbled down basement stairs. Fell down 15  stairs around 1 am this morning. Does not know how he landed. 1. Denies loss of consciousness. Did hit head. His whole head hurts. No confusion or disorientation. + HA.  2. Neck pain with ROM, especially with SB of neck to upper back right and left, sharp pain with SB. Flexion pain at base of skull 3. Bilateral shoulder pain, constant dull 4. Right wrist pain 6/10 sharp pain and right 3rd and 4th finger 8/10 pain. Buddy taped with some relief.    Past Medical History  Diagnosis Date  . Hypertension     no meds  . Headache   . Glaucoma (increased eye pressure)    Past Surgical History  Procedure Date  . No past surgeries   . Mini shunt insertion 03/09/2011    Procedure: INSERTION OF MINI SHUNT;  Surgeon: Chalmers Guest, MD;  Location: [facility] OR;  Service: Ophthalmology;  Laterality: Left;   History   Social History  . Marital Status: Married    Spouse Name: N/A    Number of Children: N/A  . Years of Education: N/A   Social History Main Topics  . Smoking status: Never Smoker   . Smokeless tobacco: None  . Alcohol Use: 3.0 oz/week    5 Cans of beer per week     daily  . Drug Use: No  . Sexually Active:    Other Topics Concern  . None   Social History Narrative  . None   No family history on file. No Known Allergies Prior to Admission medications   Medication Sig Start Date End Date Taking? Authorizing Provider  brimonidine-timolol (COMBIGAN) 0.2-0.5 % ophthalmic solution Place 1 drop into both eyes every 12 (twelve) hours.   Yes Historical  Provider, MD  brinzolamide (AZOPT) 1 % ophthalmic suspension Place 1 drop into both eyes 3 (three) times daily.   Yes Historical Provider, MD  gatifloxacin (ZYMAXID) 0.5 % SOLN Apply 1 drop to eye 4 (four) times daily.   Yes Historical Provider, MD  ibuprofen (ADVIL,MOTRIN) 600 MG tablet Take 600 mg by mouth 3 (three) times daily.   Yes Historical Provider, MD  Multiple Vitamins-Minerals (MULTIVITAMINS THER. W/MINERALS) TABS Take 1 tablet by mouth daily.   Yes Historical Provider, MD  prednisoLONE acetate (PRED FORTE) 1 % ophthalmic suspension Place 1 drop into both eyes 4 (four) times daily.   Yes Historical Provider, MD  HYDROcodone-acetaminophen (NORCO) 10-325 MG per tablet Take 1 tablet by mouth as needed. Pt taking 1 tablet QHS PRN    Historical Provider, MD  ibuprofen (ADVIL,MOTRIN) 200 MG tablet Take 500 mg by mouth 2 (two) times daily as needed. For headache    Historical Provider, MD     ROS: The patient denies fevers, chills, night sweats, unintentional weight loss, chest pain, palpitations, wheezing, dyspnea on exertion, nausea, vomiting, abdominal pain, dysuria, hematuria, melena, numbness, weakness, or tingling.  All other systems have been reviewed and were otherwise negative with the exception of those mentioned in  the HPI and as above.    PHYSICAL EXAM: Filed Vitals:   10/15/11 1513  BP: 170/105  Pulse: 89  Temp: 98 F (36.7 C)  Resp: 16   Filed Vitals:   10/15/11 1513  Height: 5\' 11"  (1.803 m)  Weight: 187 lb (84.823 kg)   Body mass index is 26.08 kg/(m^2).  General: Alert, no acute distress HEENT:  Normocephalic, atraumatic, oropharynx patent.  Cardiovascular:  Regular rate and rhythm, no rubs murmurs or gallops.  No Carotid bruits, radial pulse intact. No pedal edema.  Respiratory: Clear to auscultation bilaterally.  No wheezes, rales, or rhonchi.  No cyanosis, no use of accessory musculature GI: No organomegaly, abdomen is soft and non-tender, positive bowel  sounds.  No masses. Skin: No rashes. Neurologic: Facial musculature symmetric. Psychiatric: Patient is appropriate throughout our interaction. Lymphatic: No cervical lymphadenopathy Musculoskeletal: Gait intact. Neck-pain along trapezius and paraspinal cervical msk Shoulder-tender on palpation Neg spurling ROM intact but painful with full SB and lateral rotation Wrist -tender with flexion and extension, + radial pulse, ROM intact, senstationa d strength intact Right 4th finger-5/5 strength, sensation intact, decrease ROM due to sublux   LABS: Results for orders placed during the hospital encounter of 03/08/11  CBC      Component Value Range   WBC 8.6  4.0 - 10.5 K/uL   RBC 4.60  4.22 - 5.81 MIL/uL   Hemoglobin 14.9  13.0 - 17.0 g/dL   HCT 40.9  81.1 - 91.4 %   MCV 94.1  78.0 - 100.0 fL   MCH 32.4  26.0 - 34.0 pg   MCHC 34.4  30.0 - 36.0 g/dL   RDW 78.2  95.6 - 21.3 %   Platelets 211  150 - 400 K/uL  SURGICAL PCR SCREEN      Component Value Range   MRSA, PCR NEGATIVE  NEGATIVE   Staphylococcus aureus NEGATIVE  NEGATIVE  BASIC METABOLIC PANEL      Component Value Range   Sodium 138  135 - 145 mEq/L   Potassium 4.1  3.5 - 5.1 mEq/L   Chloride 109  96 - 112 mEq/L   CO2 22  19 - 32 mEq/L   Glucose, Bld 90  70 - 99 mg/dL   BUN 19  6 - 23 mg/dL   Creatinine, Ser 0.86  0.50 - 1.35 mg/dL   Calcium 9.5  8.4 - 57.8 mg/dL   GFR calc non Af Amer >90  >90 mL/min   GFR calc Af Amer >90  >90 mL/min     EKG/XRAY:   Primary read interpreted by Dr. Conley Rolls at [facility]. Skull-negative for fracture Neck-? C4-5 acute vs chronic changes, no fractures or dislocation Shoulder-no fx or dislocation Finger-right ring finger PIP dislocation Wrist-no fx or dislocation   ASSESSMENT/PLAN: Encounter Diagnoses  Name Primary?  . Head injury Yes  . Neck pain   . Shoulder pain   . Wrist pain   . Finger pain   . Gout   . HTN (hypertension)     1. Right ring finger dislocation at PIP-gave 2%  lidocaine without epi for local block, attempted close reduction but failed.  2. Refill HTN medication-Lisinopril-  HCTZ 10/12.5 mg 3. Follow-up on uric acid. Consider allopurinol for maintenace med since keeps getting gouty flare-up with high normal values  Needs referral to [employer], Jevante Hollibaugh PHUONG, DO 10/15/2011 4:33 PM

## 2011-10-16 ENCOUNTER — Telehealth: Payer: Self-pay

## 2011-10-16 LAB — BASIC METABOLIC PANEL WITH GFR
BUN: 14 mg/dL (ref 6–23)
CO2: 21 meq/L (ref 19–32)
Chloride: 106 meq/L (ref 96–112)
Potassium: 4.2 meq/L (ref 3.5–5.3)

## 2011-10-16 LAB — BASIC METABOLIC PANEL
Calcium: 9.4 mg/dL (ref 8.4–10.5)
Creat: 0.85 mg/dL (ref 0.50–1.35)
Glucose, Bld: 100 mg/dL — ABNORMAL HIGH (ref 70–99)
Sodium: 140 mEq/L (ref 135–145)

## 2011-10-16 LAB — URIC ACID: Uric Acid, Serum: 8.7 mg/dL — ABNORMAL HIGH (ref 4.0–7.8)

## 2011-10-16 NOTE — Telephone Encounter (Signed)
Have given the information to xray to do this for him

## 2011-10-16 NOTE — Telephone Encounter (Signed)
PT HAS AN APPOINTMENT WITH THE [facility] IN THE MORNING THAT WE REFERRED HIM TO.  HE NEEDS A COPY OF THE XRAYS ON HIS FINGER.  HIS WIFE WILL BE BY THIS EVENING TO PICK IT UP.  [phone]

## 2011-10-17 NOTE — Telephone Encounter (Signed)
Amy stated that [facility] copied xray for pt last night

## 2011-10-18 ENCOUNTER — Telehealth: Payer: Self-pay | Admitting: Family Medicine

## 2011-10-18 ENCOUNTER — Other Ambulatory Visit: Payer: Self-pay | Admitting: Family Medicine

## 2011-10-18 DIAGNOSIS — M109 Gout, unspecified: Secondary | ICD-10-CM

## 2011-10-18 MED ORDER — COLCHICINE 0.6 MG PO TABS: 0.6000 mg | ORAL_TABLET | Freq: Two times a day (BID) | ORAL | Status: DC

## 2011-10-18 NOTE — Telephone Encounter (Signed)
LM for patient regarding elevated uric acid. Will start him on colcrys and see how he does.  IF too expensive , once this attack is completed, then consider switching to allopurinol.

## 2011-11-24 ENCOUNTER — Other Ambulatory Visit: Payer: Self-pay | Admitting: Family Medicine

## 2011-12-07 ENCOUNTER — Other Ambulatory Visit: Payer: Self-pay | Admitting: Family Medicine

## 2012-05-22 ENCOUNTER — Other Ambulatory Visit: Payer: Self-pay | Admitting: Family Medicine

## 2012-07-23 ENCOUNTER — Ambulatory Visit: Payer: BC Managed Care – PPO | Admitting: Internal Medicine

## 2012-07-26 ENCOUNTER — Ambulatory Visit (INDEPENDENT_AMBULATORY_CARE_PROVIDER_SITE_OTHER): Payer: BC Managed Care – PPO | Admitting: Internal Medicine

## 2012-07-26 ENCOUNTER — Encounter: Payer: Self-pay | Admitting: Internal Medicine

## 2012-07-26 VITALS — BP 150/104 | HR 71 | Temp 98.1°F | Resp 16 | Ht 71.0 in | Wt 179.5 lb

## 2012-07-26 DIAGNOSIS — R339 Retention of urine, unspecified: Secondary | ICD-10-CM

## 2012-07-26 DIAGNOSIS — M130 Polyarthritis, unspecified: Secondary | ICD-10-CM

## 2012-07-26 DIAGNOSIS — R490 Dysphonia: Secondary | ICD-10-CM

## 2012-07-26 DIAGNOSIS — R3911 Hesitancy of micturition: Secondary | ICD-10-CM

## 2012-07-26 DIAGNOSIS — Z13220 Encounter for screening for lipoid disorders: Secondary | ICD-10-CM

## 2012-07-26 DIAGNOSIS — I10 Essential (primary) hypertension: Secondary | ICD-10-CM

## 2012-07-26 LAB — URINALYSIS, ROUTINE W REFLEX MICROSCOPIC
Bilirubin Urine: NEGATIVE
Hgb urine dipstick: NEGATIVE
Nitrite: NEGATIVE
Total Protein, Urine: NEGATIVE
Urine Glucose: NEGATIVE
pH: 6 (ref 5.0–8.0)

## 2012-07-26 LAB — MICROALBUMIN / CREATININE URINE RATIO
Creatinine,U: 147.1 mg/dL
Microalb Creat Ratio: 1 mg/g (ref 0.0–30.0)
Microalb, Ur: 1.4 mg/dL (ref 0.0–1.9)

## 2012-07-26 MED ORDER — AMLODIPINE BESYLATE 5 MG PO TABS: 5.0000 mg | ORAL_TABLET | Freq: Every day | ORAL | Status: DC
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
## 2012-07-26 NOTE — Patient Instructions (Addendum)
  UA today   If normal we can try a medication like flomax before or after your prostate exam   Return for fasting labs including PSA , testosterone level, uric acid levels , lipids,,  Screen for diabetes given your history of joint swelling  Annual physical TBD   Starting amlodipine 5 mg daily for bp  Referral for [facility] for EGD and colonoscopy

## 2012-07-28 ENCOUNTER — Encounter: Payer: Self-pay | Admitting: Internal Medicine

## 2012-07-28 DIAGNOSIS — R490 Dysphonia: Secondary | ICD-10-CM | POA: Insufficient documentation

## 2012-07-28 DIAGNOSIS — R3911 Hesitancy of micturition: Secondary | ICD-10-CM | POA: Insufficient documentation

## 2012-07-28 DIAGNOSIS — M130 Polyarthritis, unspecified: Secondary | ICD-10-CM | POA: Insufficient documentation

## 2012-07-28 NOTE — Assessment & Plan Note (Signed)
checking urine and PSA.  Will return for prostate exam.

## 2012-07-28 NOTE — Assessment & Plan Note (Signed)
Previously taking lisinopril which caused a cough.  Trial of amlodipine 5 mg
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
## 2012-07-28 NOTE — Addendum Note (Signed)
Addended by: Sherlene Shams on: 07/28/2012 08:57 PM   Modules accepted: Orders

## 2012-07-28 NOTE — Assessment & Plan Note (Signed)
Presumed secondary to goiut in the past. checking uric acid level.

## 2012-08-06 ENCOUNTER — Other Ambulatory Visit (INDEPENDENT_AMBULATORY_CARE_PROVIDER_SITE_OTHER): Payer: BC Managed Care – PPO

## 2012-08-06 DIAGNOSIS — Z13220 Encounter for screening for lipoid disorders: Secondary | ICD-10-CM

## 2012-08-06 DIAGNOSIS — I10 Essential (primary) hypertension: Secondary | ICD-10-CM

## 2012-08-06 DIAGNOSIS — R490 Dysphonia: Secondary | ICD-10-CM

## 2012-08-06 DIAGNOSIS — M130 Polyarthritis, unspecified: Secondary | ICD-10-CM

## 2012-08-06 LAB — CBC WITH DIFFERENTIAL/PLATELET
Basophils Relative: 1.3 % (ref 0.0–3.0)
Eosinophils Relative: 8.6 % — ABNORMAL HIGH (ref 0.0–5.0)
HCT: 45.3 % (ref 39.0–52.0)
Hemoglobin: 15.1 g/dL (ref 13.0–17.0)
MCV: 97.1 fl (ref 78.0–100.0)
Monocytes Absolute: 0.9 10*3/uL (ref 0.1–1.0)
Neutro Abs: 4.5 10*3/uL (ref 1.4–7.7)
Neutrophils Relative %: 59.5 % (ref 43.0–77.0)
RBC: 4.66 Mil/uL (ref 4.22–5.81)
WBC: 7.5 10*3/uL (ref 4.5–10.5)

## 2012-08-06 LAB — TSH: TSH: 2.08 u[IU]/mL (ref 0.35–5.50)

## 2012-08-06 LAB — COMPREHENSIVE METABOLIC PANEL
Albumin: 3.4 g/dL — ABNORMAL LOW (ref 3.5–5.2)
Alkaline Phosphatase: 63 U/L (ref 39–117)
BUN: 16 mg/dL (ref 6–23)
Calcium: 9.4 mg/dL (ref 8.4–10.5)
Creatinine, Ser: 1 mg/dL (ref 0.4–1.5)
Glucose, Bld: 113 mg/dL — ABNORMAL HIGH (ref 70–99)
Potassium: 4.6 mEq/L (ref 3.5–5.1)

## 2012-08-06 LAB — URIC ACID: Uric Acid, Serum: 8.5 mg/dL — ABNORMAL HIGH (ref 4.0–7.8)

## 2012-08-06 LAB — LIPID PANEL: HDL: 65.9 mg/dL (ref >39.00)

## 2012-08-08 ENCOUNTER — Encounter: Payer: Self-pay | Admitting: Internal Medicine

## 2012-08-16 ENCOUNTER — Ambulatory Visit: Payer: BC Managed Care – PPO | Admitting: Internal Medicine

## 2012-08-30 ENCOUNTER — Encounter: Payer: Self-pay | Admitting: Internal Medicine

## 2012-09-03 ENCOUNTER — Encounter: Payer: Self-pay | Admitting: Internal Medicine

## 2012-09-03 ENCOUNTER — Ambulatory Visit (INDEPENDENT_AMBULATORY_CARE_PROVIDER_SITE_OTHER): Payer: BC Managed Care – PPO | Admitting: Internal Medicine

## 2012-09-03 VITALS — BP 152/100 | HR 75 | Temp 98.1°F | Resp 14 | Ht 71.0 in | Wt 183.2 lb

## 2012-09-03 DIAGNOSIS — Z01810 Encounter for preprocedural cardiovascular examination: Secondary | ICD-10-CM | POA: Insufficient documentation

## 2012-09-03 DIAGNOSIS — H409 Unspecified glaucoma: Secondary | ICD-10-CM

## 2012-09-03 DIAGNOSIS — I10 Essential (primary) hypertension: Secondary | ICD-10-CM

## 2012-09-03 DIAGNOSIS — Z8739 Personal history of other diseases of the musculoskeletal system and connective tissue: Secondary | ICD-10-CM

## 2012-09-03 DIAGNOSIS — F528 Other sexual dysfunction not due to a substance or known physiological condition: Secondary | ICD-10-CM

## 2012-09-03 DIAGNOSIS — M109 Gout, unspecified: Secondary | ICD-10-CM | POA: Insufficient documentation

## 2012-09-03 DIAGNOSIS — Z125 Encounter for screening for malignant neoplasm of prostate: Secondary | ICD-10-CM | POA: Insufficient documentation

## 2012-09-03 DIAGNOSIS — Z113 Encounter for screening for infections with a predominantly sexual mode of transmission: Secondary | ICD-10-CM | POA: Insufficient documentation

## 2012-09-03 DIAGNOSIS — Z0000 Encounter for general adult medical examination without abnormal findings: Secondary | ICD-10-CM

## 2012-09-03 DIAGNOSIS — N529 Male erectile dysfunction, unspecified: Secondary | ICD-10-CM

## 2012-09-03 DIAGNOSIS — F5221 Male erectile disorder: Secondary | ICD-10-CM

## 2012-09-03 DIAGNOSIS — R490 Dysphonia: Secondary | ICD-10-CM

## 2012-09-03 LAB — PSA: PSA: 1.24 ng/mL (ref 0.10–4.00)

## 2012-09-03 MED ORDER — ALLOPURINOL 100 MG PO TABS: 100.0000 mg | ORAL_TABLET | Freq: Every day | ORAL | Status: DC

## 2012-09-03 NOTE — Patient Instructions (Addendum)
I have prescribed allopurinol to take daily to lower your uric acid.  You need to take it daily , and i advise takign the colchrys or an aleve intially,  Starting the day before ot prevent the gout from flaring while your uric acid level is decreasing.  Reduce beer consumption to two daily.  Try Beck's Light for a low carb alternative  Referral to urology in [place] pending.  Ok to postpone appt with Dr Rhea Belton    This is  One version of a  "Low GI"  Diet:  It will still lower your blood sugars    All of the foods can be found at grocery stores and in bulk at [employer].  The Atkins protein bars and shakes are available in more varieties at Target, WalMart and [employer].     7 AM Breakfast:  Choose from the following:  Low carbohydrate Protein  Shakes (I recommend the EAS AdvantEdge "Carb Control" shakes  Or the low carb shakes by Atkins.    2.5 carbs   Arnold's "Sandwhich Thin"toasted  w/ peanut butter (no jelly: about 20 net carbs  "Bagel Thin" with cream cheese and salmon: about 20 carbs   a scrambled egg/bacon/cheese burrito made with Mission's "carb balance" whole wheat tortilla  (about 10 net carbs )   Avoid cereal and bananas, oatmeal and cream of wheat and grits. They are loaded with carbohydrates!   10 AM: high protein snack  Protein bar by Atkins (the snack size, under 200 cal, usually < 6 net carbs).    A stick of cheese:  Around 1 carb,  100 cal     Dannon Light n Fit [place] Yogurt  (80 cal, 8 carbs)  Other so called "protein bars" and Greek yogurts tend to be loaded with carbohydrates.  Remember, in food advertising, the word "energy" is synonymous for " carbohydrate."  Lunch:   A Sandwich using the bread choices listed, Can use any  Eggs,  lunchmeat, grilled meat or canned tuna), avocado, regular mayo/mustard  and cheese.  A Salad using blue cheese, ranch,  Goddess or vinagrette,  No croutons or "confetti" and no "candied nuts" but regular nuts OK.   No pretzels or chips.   Pickles and miniature sweet peppers are a good low carb alternative that provide a "crunch"  The bread is the only source of carbohydrate in a sandwich and  can be decreased by trying some of these alternatives to traditional loaf bread  Joseph's makes a pita bread and a flat bread that are 50 cal and 4 net carbs available at BJs and WalMart.  This can be toasted to use with hummous as well  Toufayan makes a low carb flatbread that's 100 cal and 9 net carbs available at [employer] and [employer] makes 2 sizes of  Low carb whole wheat tortilla  (The large one is 210 cal and 6 net carbs) Avoid "Low fat dressings, as well as Reyne Dumas and [address] dressings They are loaded with sugar!   3 PM/ Mid day  Snack:  Consider  1 ounce of  almonds, walnuts, pistachios, pecans, peanuts,  Macadamia nuts or a nut medley.  Avoid "granola"; the dried cranberries and raisins are loaded with carbohydrates. Mixed nuts as long as there are no raisins,  cranberries or dried fruit.     6 PM  Dinner:     Meat/fowl/fish with a green salad, and either broccoli, cauliflower, green beans, spinach, brussel sprouts or  Lima beans. DO NOT BREAD THE PROTEIN!!      There is a low carb pasta by Dreamfield's that is acceptable and tastes great: only 5 digestible carbs/serving.( All grocery stores but BJs carry it )  Try Kai Levins Angelo's chicken piccata or chicken or eggplant parm over low carb pasta.(Lowes and BJs)   Clifton Custard Sanchez's "Carnitas" (pulled pork, no sauce,  0 carbs) or his beef pot roast to make a dinner burrito (at BJ's)  Pesto over low carb pasta (bj's sells a good quality pesto in the center refrigerated section of the deli   Whole wheat pasta is still full of digestible carbs and  Not as low in glycemic index as Dreamfield's.   Brown rice is still rice,  So skip the rice and noodles if you eat [place] or [place] (or at least limit to 1/2 cup)  9 PM snack :   Breyer's "low carb" fudgsicle or  ice cream bar  (Carb Smart line), or  Weight Watcher's ice cream bar , or another "no sugar added" ice cream;  a serving of fresh berries/cherries with whipped cream   Cheese or DANNON'S LlGHT N FIT [place] YOGURT  Avoid bananas, pineapple, grapes  and watermelon on a regular basis because they are high in sugar.  THINK OF THEM AS DESSERT  Remember that snack Substitutions should be less than 10 NET carbs per serving and meals < 20 carbs. Remember to subtract fiber grams to get the "net carbs."

## 2012-09-03 NOTE — Assessment & Plan Note (Signed)
s/p tube placement right eye two weeks ago.  Left eye done jan 2013;.

## 2012-09-03 NOTE — Progress Notes (Signed)
Patient ID: Jeff [name], male   DOB: 1960/09/10, 52 y.o.   MRN: [id number]  [name] here for his annual male physical examination and management of other chronic and acute problems.  He is status post 2 weeks from tube placement right eye,  (metal)   Left one done Jan 2013.   Some pain from the procedure   The risk factors are reflected in the social history.  The roster of all physicians providing medical care to patient - is listed in the Snapshot section of the chart.  Activities of daily living:  [name] 100% independent in all ADLs: dressing, toileting, feeding as well as independent mobility  Home safety : The patient has smoke detectors in the home. He wears seatbelts.  There are no firearms at home. There is no violence in the home.   There is no risks for hepatitis, STDs or HIV. There is no   history of blood transfusion. There is no travel history to infectious disease endemic areas of the world.  The patient has seen their dentist in the last six month and  their eye doctor in the last year.  They do not  have excessive sun exposure. They have seen a dermatoloigist in the last year. Discussed the need for sun protection: hats, long sleeves and use of sunscreen if there is significant sun exposure.   Diet: the importance of a healthy diet is discussed. They do have a healthy diet.  The benefits of regular aerobic exercise were discussed. He exercises a minimum of 30 minutes  5 days per week. Depression screen: there are no signs or vegative symptoms of depression- irritability, change in appetite, anhedonia, sadness/tearfullness.  The following portions of the patient's history were reviewed and updated as appropriate: allergies, current medications, past family history, past medical history,  past surgical history, past social history  and problem list.  Visual acuity was not assessed per patient preference since he has regular follow up with his ophthalmologist.  Hearing and body mass index were assessed and reviewed.   During the course of the visit the patient was educated and counseled about appropriate screening and preventive services including :  nutrition counseling, colorectal cancer screening, and recommended immunizations.    Objective:  BP 152/100  Pulse 75  Temp(Src) 98.1 F (36.7 C) (Oral)  Resp 14  Ht 5\' 11"  (1.803 m)  Wt 183 lb 4 oz (83.122 kg)  BMI 25.57 kg/m2  SpO2 97%  General Appearance:    Alert, cooperative, no distress, appears stated age  Head:    Normocephalic, without obvious abnormality, atraumatic  Eyes:    PERRL, conjunctiva/corneas clear, EOM's intact, fundi    benign, both eyes       Ears:    Normal TM's and external ear canals, both ears  Nose:   Nares normal, septum midline, mucosa normal, no drainage   or sinus tenderness  Throat:   Lips, mucosa, and tongue normal; teeth and gums normal  Neck:   Supple, symmetrical, trachea midline, no adenopathy;       thyroid:  No enlargement/tenderness/nodules; no carotid   bruit or JVD  Back:     Symmetric, no curvature, ROM normal, no CVA tenderness  Lungs:     Clear to auscultation bilaterally, respirations unlabored  Chest wall:    No tenderness or deformity  Heart:    Regular rate and rhythm, S1 and S2 normal, no murmur, rub   or gallop  Abdomen:  Soft, non-tender, bowel sounds active all four quadrants,    no masses, no organomegaly  Genitalia:    Normal male without lesion, discharge or tenderness  Rectal:    Normal tone, normal prostate, no masses or tenderness;   guaiac negative stool  Extremities:   Extremities normal, atraumatic, no cyanosis or edema  Pulses:   2+ and symmetric all extremities  Skin:   Skin color, texture, turgor normal, no rashes or lesions  Lymph nodes:   Cervical, supraclavicular, and axillary nodes normal  Neurologic:   CNII-XII intact. Normal strength, sensation and reflexes      throughout    Assessment and Plan:  Hx of  acute gouty arthritis Uric acid is elevated and he has a history of prior crystal analysis. We discussed starting allopurinol 100 mg daily .  Advised to start colchrys or aleve one day prior to prevent gout flare.   Glaucoma (increased eye pressure) s/p tube placement right eye two weeks ago.  Left eye done jan 2013;.   HTN (hypertension) Elevated today.  Will increase amlodipine to 10 mg daily   Screening for STD (sexually transmitted disease) Screening for Hepatitis  C and HIV per discussion with patient are negative  Screening for prostate cancer DRE was normal,  PSA normal.   Hoarseness of voice With history of recurrrent reflux which has resolved spontaneously,  He is awaiting GI evaluation his multiple risk factors for Barretts esophagus  Erectile dysfunction of nonorganic origin Testosterone level is normal. He is requesting trial of PDE but has a history of glaucoma .  Given his reports of penile deviation suggestive of Peyronie's disease, referral to Urology discussed.   Routine general medical examination at a health care facility Annual male exam was done including testicular and prostate exam. PSA is normal.  Colon ca screening is underway.  Urology referral is requested for peyronie's and  impotence    Updated Medication List Outpatient Encounter Prescriptions as of 09/03/2012  Medication Sig Dispense Refill  . amLODipine (NORVASC) 10 MG tablet Take 1 tablet (10 mg total) by mouth daily.  90 tablet  3  . COLCRYS 0.6 MG tablet TAKE 1 TABLET (0.6 MG TOTAL) BY MOUTH 2 (TWO) TIMES DAILY.  60 tablet  1  . HYDROcodone-acetaminophen (NORCO) 10-325 MG per tablet Take 1 tablet by mouth every 6 (six) hours as needed for pain. Pt taking 1 tablet QHS PRN  30 tablet  0  . ibuprofen (ADVIL,MOTRIN) 600 MG tablet TAKE 1 TABLET BY MOUTH 3 TIMES A DAY  60 tablet  1  . timolol (BETIMOL) 0.5 % ophthalmic solution 1 drop 2 (two) times daily.      . [DISCONTINUED] amLODipine (NORVASC) 5 MG  tablet Take 1 tablet (5 mg total) by mouth daily.  90 tablet  3  . allopurinol (ZYLOPRIM) 100 MG tablet Take 1 tablet (100 mg total) by mouth daily.  30 tablet  6  . [DISCONTINUED] bimatoprost (LUMIGAN) 0.03 % ophthalmic solution 1 drop at bedtime.      . [DISCONTINUED] brimonidine-timolol (COMBIGAN) 0.2-0.5 % ophthalmic solution Place 1 drop into both eyes every 12 (twelve) hours.      . [DISCONTINUED] brinzolamide (AZOPT) 1 % ophthalmic suspension Place 1 drop into both eyes 3 (three) times daily.      . [DISCONTINUED] gatifloxacin (ZYMAXID) 0.5 % SOLN Apply 1 drop to eye 4 (four) times daily.      . [DISCONTINUED] methazolamide (NEPTAZANE) 50 MG tablet Take 100 mg by mouth 2 (two)  times daily.      . [DISCONTINUED] Multiple Vitamins-Minerals (MULTIVITAMINS THER. W/MINERALS) TABS Take 1 tablet by mouth daily.      . [DISCONTINUED] prednisoLONE acetate (PRED FORTE) 1 % ophthalmic suspension Place 1 drop into both eyes 4 (four) times daily.       No facility-administered encounter medications on file as of 09/03/2012.
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
## 2012-09-04 ENCOUNTER — Encounter: Payer: Self-pay | Admitting: Internal Medicine

## 2012-09-04 DIAGNOSIS — F5221 Male erectile disorder: Secondary | ICD-10-CM | POA: Insufficient documentation

## 2012-09-04 LAB — TESTOSTERONE, FREE, TOTAL, SHBG
Testosterone, Free: 94.5 pg/mL (ref 47.0–244.0)
Testosterone-% Free: 2.2 % (ref 1.6–2.9)
Testosterone: 421 ng/dL (ref 300–890)

## 2012-09-04 LAB — HEPATITIS C ANTIBODY: HCV Ab: NEGATIVE

## 2012-09-04 MED ORDER — AMLODIPINE BESYLATE 10 MG PO TABS: 10.0000 mg | ORAL_TABLET | Freq: Every day | ORAL | Status: DC

## 2012-09-04 NOTE — Assessment & Plan Note (Signed)
Elevated today.  Will increase amlodipine to 10 mg daily

## 2012-09-04 NOTE — Assessment & Plan Note (Addendum)
With history of recurrrent reflux which has resolved spontaneously,  He is awaiting GI evaluation his multiple risk factors for Barretts esophagus

## 2012-09-04 NOTE — Assessment & Plan Note (Signed)
DRE was normal,  PSA normal.

## 2012-09-04 NOTE — Assessment & Plan Note (Addendum)
Screening for Hepatitis  C and HIV per discussion with patient are negative

## 2012-09-04 NOTE — Assessment & Plan Note (Signed)
Annual male exam was done including testicular and prostate exam. PSA is normal.  Colon ca screening is underway.  Urology referral is requested for peyronie's and  impotence

## 2012-09-04 NOTE — Assessment & Plan Note (Addendum)
Testosterone level is normal. He is requesting trial of PDE but has a history of glaucoma .  Given his reports of penile deviation suggestive of Peyronie's disease, referral to Urology discussed.

## 2012-09-06 ENCOUNTER — Ambulatory Visit: Payer: BC Managed Care – PPO | Admitting: Internal Medicine

## 2012-09-10 ENCOUNTER — Telehealth: Payer: Self-pay | Admitting: *Deleted

## 2012-09-10 NOTE — Telephone Encounter (Signed)
Methozolimide  Is it compatible with allopurinol?

## 2012-09-10 NOTE — Telephone Encounter (Signed)
There is no medication spelled that way and nothing on his med list that is even close. Clarify with patien t

## 2012-09-11 NOTE — Telephone Encounter (Signed)
Left message for pt to call back, ? If medication is methazolamide, for glaucoma.

## 2012-09-12 NOTE — Telephone Encounter (Signed)
Patient stated this is methazolamide for glaucoma sorry misspelled.

## 2012-09-12 NOTE — Telephone Encounter (Signed)
There is no interaction. Please update his  medication list and ask him if there are any other medications that he neglected to mention.  If his medication list is complete, the system will notify us if there are any interactions in the future.

## 2012-09-19 NOTE — Telephone Encounter (Signed)
Copy of labs sent to Dr.Eskridge at patient request.

## 2012-09-19 NOTE — Telephone Encounter (Signed)
Left message on patient voicemail as requested by patient. Updated med list as requested.

## 2012-11-11 ENCOUNTER — Other Ambulatory Visit: Payer: Self-pay | Admitting: *Deleted

## 2012-11-11 ENCOUNTER — Telehealth: Payer: Self-pay | Admitting: Internal Medicine

## 2012-11-11 MED ORDER — COLCHICINE 0.6 MG PO TABS: ORAL_TABLET | ORAL | Status: DC

## 2012-11-11 NOTE — Telephone Encounter (Signed)
Script sent,

## 2012-11-11 NOTE — Telephone Encounter (Signed)
Gout medication - Colcrys.  This was previously prescribed by another doctor but he is out and needs the refill.  He did speak with his pharmacy and they were to send the request as of yesterday, 9/21.  Allopurinol does not seem to help when he has a flare-up, so pt does need the Colcrys.  Also pt states his insurance changed in June and now needs 90 day supplies on medications to help with cost on new health plan.  Wants to talk about not sleeping well at night.  Asking if he can handle this by phone or if he has to come in for an appointment to discuss.  Advised appt may be necessary if this is a new issue.  May leave msg on pt cell/home.

## 2012-11-15 ENCOUNTER — Encounter: Payer: Self-pay | Admitting: Internal Medicine

## 2012-11-15 ENCOUNTER — Ambulatory Visit (INDEPENDENT_AMBULATORY_CARE_PROVIDER_SITE_OTHER): Payer: BC Managed Care – PPO | Admitting: Internal Medicine

## 2012-11-15 VITALS — BP 122/98 | HR 69 | Temp 98.8°F | Resp 16 | Wt 184.0 lb

## 2012-11-15 DIAGNOSIS — Z8739 Personal history of other diseases of the musculoskeletal system and connective tissue: Secondary | ICD-10-CM

## 2012-11-15 DIAGNOSIS — M109 Gout, unspecified: Secondary | ICD-10-CM

## 2012-11-15 DIAGNOSIS — G4700 Insomnia, unspecified: Secondary | ICD-10-CM

## 2012-11-15 DIAGNOSIS — I10 Essential (primary) hypertension: Secondary | ICD-10-CM

## 2012-11-15 DIAGNOSIS — J069 Acute upper respiratory infection, unspecified: Secondary | ICD-10-CM

## 2012-11-15 LAB — CBC WITH DIFFERENTIAL/PLATELET
Basophils Absolute: 0 10*3/uL (ref 0.0–0.1)
Basophils Relative: 0 % (ref 0–1)
Eosinophils Relative: 5 % (ref 0–5)
Hemoglobin: 14.8 g/dL (ref 13.0–17.0)
Lymphocytes Relative: 12 % (ref 12–46)
MCHC: 35 g/dL (ref 30.0–36.0)
MCV: 92.2 fL (ref 78.0–100.0)
Monocytes Relative: 14 % — ABNORMAL HIGH (ref 3–12)
Neutro Abs: 8.4 10*3/uL — ABNORMAL HIGH (ref 1.7–7.7)
Neutrophils Relative %: 69 % (ref 43–77)
RBC: 4.59 MIL/uL (ref 4.22–5.81)
RDW: 13.6 % (ref 11.5–15.5)
WBC: 12.3 10*3/uL — ABNORMAL HIGH (ref 4.0–10.5)

## 2012-11-15 MED ORDER — LEVOFLOXACIN 500 MG PO TABS: 500.0000 mg | ORAL_TABLET | Freq: Every day | ORAL | Status: DC

## 2012-11-15 MED ORDER — COLCHICINE 0.6 MG PO TABS: ORAL_TABLET | ORAL | Status: DC

## 2012-11-15 MED ORDER — HYDROCODONE-ACETAMINOPHEN 10-325 MG PO TABS: 1.0000 | ORAL_TABLET | Freq: Four times a day (QID) | ORAL | Status: DC | PRN

## 2012-11-15 MED ORDER — PREDNISONE (PAK) 10 MG PO TABS: ORAL_TABLET | ORAL | Status: DC

## 2012-11-15 MED ORDER — HYDROCOD POLST-CHLORPHEN POLST 10-8 MG/5ML PO LQCR: 10.0000 mL | Freq: Every evening | ORAL | Status: DC | PRN

## 2012-11-15 NOTE — Assessment & Plan Note (Addendum)
Last gout attack occurred last weekend.  Left knee and right ankle  .,  Feeling some what better but not back to normal.  Uric acid was elevated and he was prescribed allopurinol .  Uric acid level is still > 6.0 on 100 mg allopurinol daily,  Will confirm that he is taking it daily before increasing to to 200 mg daily

## 2012-11-15 NOTE — Patient Instructions (Addendum)
.  You have a viral  Syndrome .  The post nasal drip is causing your sore throat.  Lavage your sinuses twice daily with Simply saline nasal spray.  Use benadryl 25 mg every 8 hours and Sudafed PE 10 to 30 mg Usevery 8 hours to manage the drainage and congestion.  Gargle with salt water often for the sore throat.  Use the Tussionex  Cough syrup for severe cough.  It is sedating .  You can use Delsym for less severe cough    If the throat is no better  In  1 to 2 days OR  if you develop T > 100.4,  Green nasal discharge,  Or severe facial pain,  Start the levaquin

## 2012-11-15 NOTE — Progress Notes (Signed)
Patient ID: Jeff [name], male   DOB: 11-Jan-1961, 52 y.o.   MRN: [id number]   Patient Active Problem List   Diagnosis Date Noted  . Acute upper respiratory infections of unspecified site 11/17/2012  . Erectile dysfunction of nonorganic origin 09/04/2012  . Hx of acute gouty arthritis 09/03/2012  . Routine general medical examination at a health care facility 09/03/2012  . Screening for STD (sexually transmitted disease) 09/03/2012  . Screening for prostate cancer 09/03/2012  . Urinary hesitancy 07/28/2012  . Hoarseness of voice 07/28/2012  . Polyarthritis 07/28/2012  . Glaucoma (increased eye pressure) 03/22/2011  . HTN (hypertension) 03/22/2011    Subjective:  CC:   Chief Complaint  Patient presents with  . Acute Visit    chills ,cough, no apetitie. congestion    HPI:   Jeff [name] a 52 y.o. male who presents for an acute visit but has multiple other issues he wants to discuss.   he has 5 day history of worsening URi symptoms.  Cough, scratchy throat,  Chest tightness.  Raging sinus headache. Eyes are red,  Getting more productive  Nasal drainage has turned turned from clear to yellow . Has not taken any over the counter medications in 5 days. No recent travel or sick contacts.    2) chronic trouble sleeping,  Has been a problem for years. No prior treatmen, describes difficulty alling and staying asleep.    Snores per wife but no apneic spells reported    3) recurrent gout flares.  He has been using colchrys and vicodin for gout flares and for knee pain/      Past Medical History  Diagnosis Date  . Hypertension     no meds  . Headache(784.0)   . Glaucoma (increased eye pressure)     Past Surgical History  Procedure Laterality Date  . No past surgeries    . Mini shunt insertion  03/09/2011    Procedure: INSERTION OF MINI SHUNT;  Surgeon: Chalmers Guest, MD;  Location: [facility] OR;  Service: Ophthalmology;  Laterality: Left;  . Finger surgery         The following  portions of the patient's history were reviewed and updated as appropriate: Allergies, current medications, and problem list.    Review of Systems:   12 Pt  review of systems was negative except those addressed in the HPI,     History   Social History  . Marital Status: Married    Spouse Name: N/A    Number of Children: N/A  . Years of Education: N/A   Occupational History  . Not on file.   Social History Main Topics  . Smoking status: Never Smoker   . Smokeless tobacco: Never Used  . Alcohol Use: 3.0 oz/week    5 Cans of beer per week     Comment: daily  . Drug Use: No  . Sexual Activity: Not on file   Other Topics Concern  . Not on file   Social History Narrative  . No narrative on file    Objective:  Filed Vitals:   11/15/12 1634  BP: 122/98  Pulse: 69  Temp: 98.8 F (37.1 C)  Resp: 16     General appearance: alert, cooperative and appears stated age Ears: normal TM's and external ear canals both ears Throat: lips, mucosa, and tongue normal; teeth and gums normal Neck: no adenopathy, no carotid bruit, supple, symmetrical, trachea midline and thyroid not enlarged, symmetric, no tenderness/mass/nodules Back: symmetric, no curvature.  ROM normal. No CVA tenderness. Lungs: clear to auscultation bilaterally Heart: regular rate and rhythm, S1, S2 normal, no murmur, click, rub or gallop Abdomen: soft, non-tender; bowel sounds normal; no masses,  no organomegaly Pulses: 2+ and symmetric Skin: Skin color, texture, turgor normal. No rashes or lesions Lymph nodes: Cervical, supraclavicular, and axillary nodes normal.  Assessment and Plan:  Hx of acute gouty arthritis Last gout attack occurred last weekend.  Left knee and right ankle  .,  Feeling some what better but not back to normal.  Uric acid was elevated and he was prescribed allopurinol .  Uric acid level is still > 6.0 on 100 mg allopurinol daily,  Will confirm that he is taking it daily before  increasing to to 200 mg daily   Acute upper respiratory infections of unspecified site Patient's URI is most likely viral given the mild HEENT symptoms.  I have explained that in viral URIS, an antibiotic will not help the symptoms and will increase the risk of developing C difficile enteritis.   Advised to use benadryl and sudafed PE  Ibuprofen 400 mg and tylenol 650 mq 8 hrs for aches and pains,  And will add tussiones  Every 12 hours  prn cough.  Instructed to start the rx for levaquin  if symptoms worsen to include fevers, facial pain, ear pain or purulent sputum/drainage.   HTN (hypertension) Improved on current regimen but not at goal. [facility] he is ill today,  Will have him return when he is feeling better and adjust meds if needed.   Insomnia Trial of trazodone  Start with 50 mg daily 1 hour befre bed.  May increase to 100 mg after one week if no change.    Updated Medication List Outpatient Encounter Prescriptions as of 11/15/2012  Medication Sig Dispense Refill  . allopurinol (ZYLOPRIM) 100 MG tablet Take 1 tablet (100 mg total) by mouth daily.  30 tablet  6  . amLODipine (NORVASC) 10 MG tablet Take 1 tablet (10 mg total) by mouth daily.  90 tablet  3  . colchicine (COLCRYS) 0.6 MG tablet TAKE 1 TABLET (0.6 MG TOTAL) BY MOUTH 2 (TWO) TIMES DAILY.  60 tablet  2  . ibuprofen (ADVIL,MOTRIN) 600 MG tablet TAKE 1 TABLET BY MOUTH 3 TIMES A DAY  60 tablet  1  . methazolamide (NEPTAZANE) 25 MG tablet Take 25 mg by mouth 2 (two) times daily.      . timolol (BETIMOL) 0.5 % ophthalmic solution 1 drop 2 (two) times daily.      . [DISCONTINUED] colchicine (COLCRYS) 0.6 MG tablet TAKE 1 TABLET (0.6 MG TOTAL) BY MOUTH 2 (TWO) TIMES DAILY.  60 tablet  2  . chlorpheniramine-HYDROcodone (TUSSIONEX) 10-8 MG/5ML LQCR Take 10 mLs by mouth at bedtime as needed.  180 mL  0  . HYDROcodone-acetaminophen (NORCO) 10-325 MG per tablet Take 1 tablet by mouth every 6 (six) hours as needed for pain. Pt taking 1  tablet QHS PRN  30 tablet  0  . levofloxacin (LEVAQUIN) 500 MG tablet Take 1 tablet (500 mg total) by mouth daily.  7 tablet  0  . predniSONE (STERAPRED UNI-PAK) 10 MG tablet 6 tablets on Day 1 , then reduce by 1 tablet daily until gone  21 tablet  0  . traZODone (DESYREL) 50 MG tablet Take 1 tablet (50 mg total) by mouth at bedtime.  90 tablet  3  . [DISCONTINUED] HYDROcodone-acetaminophen (NORCO) 10-325 MG per tablet Take 1 tablet by mouth every  6 (six) hours as needed for pain. Pt taking 1 tablet QHS PRN  30 tablet  0   No facility-administered encounter medications on file as of 11/15/2012.

## 2012-11-16 LAB — URIC ACID: Uric Acid, Serum: 7.3 mg/dL (ref 4.0–7.8)

## 2012-11-17 DIAGNOSIS — G4700 Insomnia, unspecified: Secondary | ICD-10-CM | POA: Insufficient documentation

## 2012-11-17 DIAGNOSIS — J069 Acute upper respiratory infection, unspecified: Secondary | ICD-10-CM | POA: Insufficient documentation

## 2012-11-17 MED ORDER — TRAZODONE HCL 50 MG PO TABS: 50.0000 mg | ORAL_TABLET | Freq: Every day | ORAL | Status: DC

## 2012-11-17 NOTE — Assessment & Plan Note (Addendum)
Patient's URI is most likely viral given the mild HEENT symptoms.  I have explained that in viral URIS, an antibiotic will not help the symptoms and will increase the risk of developing C difficile enteritis.   Advised to use benadryl and sudafed PE  Ibuprofen 400 mg and tylenol 650 mq 8 hrs for aches and pains,  And will add tussiones  Every 12 hours  prn cough.  Instructed to start the rx for levaquin  if symptoms worsen to include fevers, facial pain, ear pain or purulent sputum/drainage.

## 2012-11-17 NOTE — Assessment & Plan Note (Signed)
Improved on current regimen but not at goal. [facility] he is ill today,  Will have him return when he is feeling better and adjust meds if needed.

## 2012-11-17 NOTE — Assessment & Plan Note (Signed)
Trial of trazodone  Start with 50 mg daily 1 hour befre bed.  May increase to 100 mg after one week if no change.

## 2012-11-26 ENCOUNTER — Other Ambulatory Visit: Payer: Self-pay | Admitting: *Deleted

## 2012-11-26 NOTE — Telephone Encounter (Signed)
Ok to refill 

## 2012-11-27 ENCOUNTER — Other Ambulatory Visit: Payer: Self-pay | Admitting: Internal Medicine

## 2012-11-27 MED ORDER — IBUPROFEN 600 MG PO TABS: ORAL_TABLET | ORAL | Status: DC

## 2012-11-27 MED ORDER — ALLOPURINOL 150 MG HALF TABLET: 150.0000 mg | ORAL_TABLET | Freq: Every day | ORAL | Status: DC

## 2012-11-28 ENCOUNTER — Other Ambulatory Visit: Payer: Self-pay | Admitting: *Deleted

## 2012-11-28 NOTE — Telephone Encounter (Signed)
Pharmacy needs clarification on allopurinol script reads Allopurinol150 mg tablet / Take 0.5 tablets mouth daily ( 150 mg total ) by mouth daily does patient 75 or 150 and pharmacy stated only comes 100 and 300 mg tablets please advise.

## 2012-12-26 ENCOUNTER — Other Ambulatory Visit: Payer: Self-pay

## 2013-02-19 ENCOUNTER — Other Ambulatory Visit: Payer: Self-pay | Admitting: *Deleted

## 2013-02-19 MED ORDER — ALLOPURINOL 150 MG HALF TABLET: 150.0000 mg | ORAL_TABLET | Freq: Every day | ORAL | Status: DC

## 2013-02-19 MED ORDER — ALLOPURINOL 300 MG PO TABS: 150.0000 mg | ORAL_TABLET | Freq: Every day | ORAL | Status: DC

## 2013-02-19 NOTE — Telephone Encounter (Signed)
Pharmacy only has 100 mg and 300 mg of allopurinol. Refill sent for 300 mg instructed to still take half tablet (150 mg).

## 2013-02-19 NOTE — Telephone Encounter (Signed)
Pharmacy states pts insurance only covers 90 day supply for the allopurinol refill sent per protocol. DJR

## 2013-03-11 ENCOUNTER — Other Ambulatory Visit: Payer: Self-pay | Admitting: *Deleted

## 2013-03-11 NOTE — Telephone Encounter (Signed)
Ok refill? 

## 2013-03-17 ENCOUNTER — Other Ambulatory Visit: Payer: Self-pay | Admitting: *Deleted

## 2013-03-17 MED ORDER — IBUPROFEN 600 MG PO TABS: ORAL_TABLET | ORAL | Status: DC

## 2013-03-24 MED ORDER — IBUPROFEN 600 MG PO TABS: ORAL_TABLET | ORAL | Status: DC

## 2013-07-10 ENCOUNTER — Other Ambulatory Visit: Payer: Self-pay | Admitting: Ophthalmology

## 2013-07-10 ENCOUNTER — Encounter (HOSPITAL_COMMUNITY): Payer: Self-pay | Admitting: Pharmacy Technician

## 2013-07-10 MED ORDER — MITOMYCIN 0.2 MG OP KIT: 0.2000 mg | PACK | Freq: Once | OPHTHALMIC | Status: DC

## 2013-07-10 MED ORDER — TETRACAINE HCL 0.5 % OP SOLN: 1.0000 [drp] | OPHTHALMIC | Status: DC

## 2013-07-10 NOTE — Pre-Procedure Instructions (Addendum)
Jeff BarreWilliam K [name]  07/10/2013   Your procedure is scheduled on: Wednesday, May 27.  [facility].  Call this number if you have problems the morning of surgery: [phone]   Remember:   Do not eat food or drink liquids after midnight Tuesday.   Take these medicines the morning of surgery with A SIP OF WATER: amLODipine (NORVASC), allopurinol (ZYLOPRIM), methazolamide (NEPTAZANE).                 Do not wear jewelry, make-up or nail polish.  Do not wear lotions, powders, or perfumes.   Men may shave face and neck.  Do not bring valuables to the hospital.               [facility] is not responsible for any belongings or valuables.               Contacts, dentures or bridgework may not be worn into surgery.  Leave suitcase in the car. After surgery it may be brought to your room.  For patients admitted to the hospital, discharge time is determined by your  treatment team.               Patients discharged the day of surgery will not be allowed to drive home.  Name and phone number of your driver: -   Special Instructions: Review  [place] - Preparing For Surgery.   Please read over the following fact sheets that you were given: Pain Booklet, Coughing and Deep Breathing and Surgical Site Infection Prevention

## 2013-07-10 NOTE — H&P (Signed)
History & Physical:   DATE:   06-20-13  NAME:  Jeff Jeff [name], Jeff Jeff [name]     [id number]       HISTORY OF PRESENT ILLNESS: Chief Eye Complaints blurry vision  INTERFERING WITH WORK    Glaucoma  patient  :    .  HPI: EYES: Reports symptoms of vision disturbances.        LOCATION:   LEFT EYE        QUALITY/COURSE:   Reports condition is worsening.        INTENSITY/SEVERITY:    Reports measurement ( or degree) as severe .      DURATION:   Reports the general length of symptoms to be months.      ONSET/TIMING:   Reports occurrence as continuous.      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   ICD#377.14  Onset: 09/05/2012 11:27  Initial Date:    Posterior subcapsular polar senile cataract   ICD#366.14  Onset: 12/20/2012 13:35  Initial Date:   Left eye Stable post OP glaucoma device with [facility] OD  IOP up & down  OS  SURGERIES: Pick List - Surgeries   [facility]  SLT ou 360 x's 3 OD  Glaucoma Surgery Jan 2013 OS Glaucoma Surgery revision bleb Feb 2013   MEDICATIONS: Neptazane: 50 mg tablet SIG-  2 tabs q am 1 tab qhs   Timolol (Timoptic) Ophthalmic Solution:   0.5% solution  SIG-  1 drop(s)   once a day   Xalatan (Latanoprost) Ophthalmic Solution:    0.005% solution    SIG-   1 drop(s)  drop into each eye     once a day in the PM                Comment-  REVIEW OF SYSTEMS: ROS:   GEN- Constitutional: Negative general-constitutional systems review.      HENT: GEN - Endocrine: Reports symptoms of LUNGS/Respiratory:  HEART/Cardiovascular: Reports symptoms of ABD/Gastrointestinal:   Musculoskeletal (BJE): NEURO/Neurological: PSYCH/Psychiatric:    Is the pt oriented to time, place, person? YES Mood   agitated __  TOBACCO: No exposure  to tobacco.      Never smoker   ICD#V13.89  Tobacco use:     Tobacco cessation:  SOCIAL HISTORY: Married.   UPS  FAMILY HISTORY: Family History - 1st Degree Relatives:  Mother dead.  Father is dead.    ALLERGIES: Alphagan P:  combigan  PHYSICAL EXAMINATION: VS: BMI: 24.7.  BP: 128/82.  H: 72.00 in.  P: 88 /min.  RR: 20 /min.  W: 182lbs 0oz.    Va OD [place] 20/40 PH 20NI+ cc 20/30-2 PH 20/NI OS [name] 20/200 PH 20/NI cc 20/400 PH 20/NI  MR OD:-1.50+1.75 x 060 20/40+ OS -1.00 +0.75 x 050 20/200+1  w/head movement  Jeff [name]'s05/02/2013 12:33  OD:42.75 43.75 OS:43.25 44.00  VF:   OD   Full to confrontation testing  OS   Full to confrontation testing     Motility FULL  PUPILS: round OU  EYELIDS & OCULAR ADNEXA normal    SLE: Conjunctiva 180 defuse bleb  -seidel ,localized w/ no leek  bleb Injection OS   Cornea:normal    anterior chamber :Deep and quiet deep w/ tube in good position OU  Iris  normal    Lens  +1  nuclear  sclerosis  OU, central PSC OS   Ta   in mmHg    OD12     OS  36  after DP 27 no Neptazane today Time 06/20/2013 13:04   Gonio OS tube  lumen open   Dilation    Fundus: optic nerve   OD    pale color 85% cup rim intact                                               OS pale 95% cup.  Thin rim   Macula       OD     clear                                                OS  clear  Vessels Normal  Periphery Normal  Exam: GENERAL: Appearance: HEAD, EARS, NOSE AND THROAT: Ears-Nose (external) Inspection: Externally, nose and ears are normal in appearance and without scars, lesions, or nodules.      Hearing assessment shows no problems with normal conversation.      LUNGS and RESPIRATORY: Lung auscultation elicits no wheezing, rhonci, rales or rubs and with equal breath sounds.    Respiratory effort described as breathing is unlabored and chest movement is symmetrical.    HEART (Cardiovascular): Heart  auscultation discovers regular rate and rhythm; no murmur, gallop or rub. Normal heart sounds.    ABDOMEN (Gastrointestinal): Mass/Tenderness Exam: Neither are present.     MUSCULOSKELETAL (BJE): Inspection-Palpation: No major bone, joint, tendon, or muscle changes.      NEUROLOGICAL: Alert and oriented. No major deficits of coordination or sensation.      PSYCHIATRIC: Insight and judgment appear  both to be intact and appropriate.    Mood and affect are described as normal mood and full affect.    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   Use prolensa & ocuflox pre op .  add Xalatan (Latanoprost) Ophthalmic Solution:    0.005% solution    SIG-      OS QHS once a day in the PM     in each eye  Chalmers Guest, Montez Hageman.

## 2013-07-11 ENCOUNTER — Encounter (HOSPITAL_COMMUNITY): Payer: Self-pay

## 2013-07-11 ENCOUNTER — Ambulatory Visit (HOSPITAL_COMMUNITY)
Admission: RE | Admit: 2013-07-11 | Discharge: 2013-07-11 | Disposition: A | Discharge status: Home / Self Care | Payer: BC Managed Care – PPO | Source: Ambulatory Visit | Attending: Anesthesiology | Admitting: Anesthesiology

## 2013-07-11 ENCOUNTER — Encounter (HOSPITAL_COMMUNITY)
Admission: RE | Admit: 2013-07-11 | Discharge: 2013-07-11 | Disposition: A | Discharge status: Home / Self Care | Payer: BC Managed Care – PPO | Source: Ambulatory Visit | Attending: Ophthalmology | Admitting: Ophthalmology

## 2013-07-11 DIAGNOSIS — Z01812 Encounter for preprocedural laboratory examination: Secondary | ICD-10-CM | POA: Insufficient documentation

## 2013-07-11 DIAGNOSIS — Z01810 Encounter for preprocedural cardiovascular examination: Secondary | ICD-10-CM | POA: Insufficient documentation

## 2013-07-11 DIAGNOSIS — I10 Essential (primary) hypertension: Secondary | ICD-10-CM | POA: Insufficient documentation

## 2013-07-11 DIAGNOSIS — Z01818 Encounter for other preprocedural examination: Secondary | ICD-10-CM | POA: Insufficient documentation

## 2013-07-11 HISTORY — DX: Gastro-esophageal reflux disease without esophagitis: K21.9

## 2013-07-11 HISTORY — DX: Unspecified osteoarthritis, unspecified site: M19.90

## 2013-07-11 LAB — BASIC METABOLIC PANEL
BUN: 14 mg/dL (ref 6–23)
CHLORIDE: 103 meq/L (ref 96–112)
CO2: 22 mEq/L (ref 19–32)
Calcium: 8.9 mg/dL (ref 8.4–10.5)
Creatinine, Ser: 0.79 mg/dL (ref 0.50–1.35)
GFR calc non Af Amer: >90 mL/min (ref >90)
Glucose, Bld: 112 mg/dL — ABNORMAL HIGH (ref 70–99)
Potassium: 4.2 mEq/L (ref 3.7–5.3)
SODIUM: 138 meq/L (ref 137–147)

## 2013-07-11 LAB — CBC
HEMATOCRIT: 46.7 % (ref 39.0–52.0)
HEMOGLOBIN: 15.4 g/dL (ref 13.0–17.0)
MCH: 31.7 pg (ref 26.0–34.0)
MCHC: 33 g/dL (ref 30.0–36.0)
MCV: 96.1 fL (ref 78.0–100.0)
Platelets: 201 10*3/uL (ref 150–400)
RBC: 4.86 MIL/uL (ref 4.22–5.81)
RDW: 14.1 % (ref 11.5–15.5)
WBC: 6.4 10*3/uL (ref 4.0–10.5)

## 2013-07-11 NOTE — Progress Notes (Signed)
07/11/13 1035  OBSTRUCTIVE SLEEP APNEA  Have you ever been diagnosed with sleep apnea through a sleep study? No  Do you snore loudly (loud enough to be heard through closed doors)?  0  Do you often feel tired, fatigued, or sleepy during the daytime? 1  Has anyone observed you stop breathing during your sleep? 0  Do you have, or are you being treated for high blood pressure? 1  BMI more than 35 kg/m2? 0  Age over 51 years old? 1  Neck circumference greater than 40 cm/16 inches? 0 (16)  Gender: 1  Obstructive Sleep Apnea Score 4  Score 4 or greater  Results sent to PCP

## 2013-07-15 MED ORDER — GATIFLOXACIN 0.5 % OP SOLN: 1.0000 [drp] | OPHTHALMIC | Status: DC | PRN

## 2013-07-15 MED ORDER — TROPICAMIDE 1 % OP SOLN
1.0000 [drp] | OPHTHALMIC | Status: DC
  Filled 2013-07-15: qty 3

## 2013-07-15 MED ORDER — CYCLOPENTOLATE HCL 1 % OP SOLN
1.0000 [drp] | OPHTHALMIC | Status: DC
  Filled 2013-07-15: qty 2

## 2013-07-15 MED ORDER — KETOROLAC TROMETHAMINE 0.5 % OP SOLN
1.0000 [drp] | OPHTHALMIC | Status: DC
  Filled 2013-07-15: qty 5

## 2013-07-15 MED ORDER — PHENYLEPHRINE HCL 2.5 % OP SOLN
1.0000 [drp] | OPHTHALMIC | Status: DC
  Filled 2013-07-15: qty 15

## 2013-07-16 ENCOUNTER — Ambulatory Visit (HOSPITAL_COMMUNITY): Payer: BC Managed Care – PPO | Admitting: Certified Registered Nurse Anesthetist

## 2013-07-16 ENCOUNTER — Ambulatory Visit (HOSPITAL_COMMUNITY)
Admission: RE | Admit: 2013-07-16 | Discharge: 2013-07-16 | Disposition: A | Discharge status: Home / Self Care | Payer: BC Managed Care – PPO | Source: Ambulatory Visit | Attending: Ophthalmology | Admitting: Ophthalmology

## 2013-07-16 ENCOUNTER — Encounter (HOSPITAL_COMMUNITY)
Admission: RE | Disposition: A | Discharge status: Home / Self Care | Payer: Self-pay | Source: Ambulatory Visit | Attending: Ophthalmology

## 2013-07-16 ENCOUNTER — Encounter (HOSPITAL_COMMUNITY): Payer: BC Managed Care – PPO | Admitting: Certified Registered Nurse Anesthetist

## 2013-07-16 ENCOUNTER — Encounter (HOSPITAL_COMMUNITY): Payer: Self-pay | Admitting: Certified Registered Nurse Anesthetist

## 2013-07-16 DIAGNOSIS — H25049 Posterior subcapsular polar age-related cataract, unspecified eye: Secondary | ICD-10-CM | POA: Insufficient documentation

## 2013-07-16 DIAGNOSIS — M129 Arthropathy, unspecified: Secondary | ICD-10-CM | POA: Insufficient documentation

## 2013-07-16 DIAGNOSIS — H409 Unspecified glaucoma: Secondary | ICD-10-CM | POA: Insufficient documentation

## 2013-07-16 DIAGNOSIS — K219 Gastro-esophageal reflux disease without esophagitis: Secondary | ICD-10-CM | POA: Insufficient documentation

## 2013-07-16 DIAGNOSIS — I10 Essential (primary) hypertension: Secondary | ICD-10-CM | POA: Insufficient documentation

## 2013-07-16 DIAGNOSIS — Z79899 Other long term (current) drug therapy: Secondary | ICD-10-CM | POA: Insufficient documentation

## 2013-07-16 HISTORY — PX: CATARACT EXTRACTION W/PHACO: SHX586

## 2013-07-16 SURGERY — PHACOEMULSIFICATION, CATARACT, WITH IOL INSERTION
Anesthesia: General | Site: Eye | Laterality: Left

## 2013-07-16 MED ORDER — SUCCINYLCHOLINE CHLORIDE 20 MG/ML IJ SOLN
INTRAMUSCULAR | Status: AC
  Filled 2013-07-16: qty 1

## 2013-07-16 MED ORDER — TETRACAINE HCL 0.5 % OP SOLN
OPHTHALMIC | Status: AC
  Filled 2013-07-16: qty 2

## 2013-07-16 MED ORDER — EPHEDRINE SULFATE 50 MG/ML IJ SOLN
INTRAMUSCULAR | Status: AC
  Filled 2013-07-16: qty 1

## 2013-07-16 MED ORDER — GENTAMICIN SULFATE 40 MG/ML IJ SOLN
INTRAMUSCULAR | Status: AC
  Filled 2013-07-16: qty 2

## 2013-07-16 MED ORDER — EPINEPHRINE HCL 1 MG/ML IJ SOLN
INTRAMUSCULAR | Status: AC
  Filled 2013-07-16: qty 1

## 2013-07-16 MED ORDER — LACTATED RINGERS IV SOLN
INTRAVENOUS | Status: DC | PRN
  Administered 2013-07-16: 08:00:00 via INTRAVENOUS

## 2013-07-16 MED ORDER — ROCURONIUM BROMIDE 50 MG/5ML IV SOLN
INTRAVENOUS | Status: AC
  Filled 2013-07-16: qty 1

## 2013-07-16 MED ORDER — TROPICAMIDE 1 % OP SOLN
1.0000 [drp] | OPHTHALMIC | Status: AC
  Administered 2013-07-16 (×3): 1 [drp] via OPHTHALMIC

## 2013-07-16 MED ORDER — LIDOCAINE HCL (CARDIAC) 20 MG/ML IV SOLN
INTRAVENOUS | Status: AC
  Filled 2013-07-16: qty 10

## 2013-07-16 MED ORDER — ACETYLCHOLINE CHLORIDE 1:100 IO SOLR
INTRAOCULAR | Status: DC | PRN
  Administered 2013-07-16: 5 mg via INTRAOCULAR

## 2013-07-16 MED ORDER — LIDOCAINE-EPINEPHRINE 2 %-1:100000 IJ SOLN
INTRAMUSCULAR | Status: AC
  Filled 2013-07-16: qty 1

## 2013-07-16 MED ORDER — PILOCARPINE HCL 4 % OP SOLN
OPHTHALMIC | Status: AC
  Filled 2013-07-16: qty 15

## 2013-07-16 MED ORDER — STERILE WATER FOR INJECTION IJ SOLN
INTRAMUSCULAR | Status: AC
  Filled 2013-07-16: qty 10

## 2013-07-16 MED ORDER — NA CHONDROIT SULF-NA HYALURON 40-30 MG/ML IO SOLN
INTRAOCULAR | Status: AC
  Filled 2013-07-16: qty 0.5

## 2013-07-16 MED ORDER — GATIFLOXACIN 0.5 % OP SOLN
1.0000 [drp] | OPHTHALMIC | Status: AC
  Administered 2013-07-16 (×3): 1 [drp] via OPHTHALMIC

## 2013-07-16 MED ORDER — FLUORESCEIN SODIUM 1 MG OP STRP
ORAL_STRIP | OPHTHALMIC | Status: DC | PRN
  Administered 2013-07-16: 1 via OPHTHALMIC

## 2013-07-16 MED ORDER — MIDAZOLAM HCL 2 MG/2ML IJ SOLN
INTRAMUSCULAR | Status: AC
  Filled 2013-07-16: qty 2

## 2013-07-16 MED ORDER — DEXAMETHASONE SODIUM PHOSPHATE 10 MG/ML IJ SOLN
INTRAMUSCULAR | Status: AC
  Filled 2013-07-16: qty 1

## 2013-07-16 MED ORDER — ONDANSETRON HCL 4 MG/2ML IJ SOLN
INTRAMUSCULAR | Status: AC
  Filled 2013-07-16: qty 2

## 2013-07-16 MED ORDER — LIDOCAINE-EPINEPHRINE 2 %-1:100000 IJ SOLN
INTRAMUSCULAR | Status: DC | PRN
  Administered 2013-07-16: 08:00:00 via RETROBULBAR

## 2013-07-16 MED ORDER — LACTATED RINGERS IV SOLN: INTRAVENOUS | Status: DC

## 2013-07-16 MED ORDER — LIDOCAINE HCL 2 % IJ SOLN
INTRAMUSCULAR | Status: AC
  Filled 2013-07-16: qty 20

## 2013-07-16 MED ORDER — NA CHONDROIT SULF-NA HYALURON 40-30 MG/ML IO SOLN
INTRAOCULAR | Status: DC | PRN
  Administered 2013-07-16: 0.5 mL via INTRAOCULAR

## 2013-07-16 MED ORDER — MIDAZOLAM HCL 5 MG/5ML IJ SOLN
INTRAMUSCULAR | Status: DC | PRN
  Administered 2013-07-16: 2 mg via INTRAVENOUS

## 2013-07-16 MED ORDER — MITOMYCIN 0.2 MG OP KIT
PACK | OPHTHALMIC | Status: DC | PRN
  Administered 2013-07-16: .4 mg via OPHTHALMIC

## 2013-07-16 MED ORDER — CYCLOPENTOLATE HCL 1 % OP SOLN
1.0000 [drp] | OPHTHALMIC | Status: AC
  Administered 2013-07-16 (×3): 1 [drp] via OPHTHALMIC

## 2013-07-16 MED ORDER — BSS IO SOLN
INTRAOCULAR | Status: AC
  Filled 2013-07-16: qty 500

## 2013-07-16 MED ORDER — KETOROLAC TROMETHAMINE 0.5 % OP SOLN
1.0000 [drp] | OPHTHALMIC | Status: AC
  Administered 2013-07-16 (×3): 1 [drp] via OPHTHALMIC

## 2013-07-16 MED ORDER — PHENYLEPHRINE HCL 2.5 % OP SOLN
1.0000 [drp] | OPHTHALMIC | Status: AC
  Administered 2013-07-16 (×3): 1 [drp] via OPHTHALMIC

## 2013-07-16 MED ORDER — SODIUM CHLORIDE 0.9 % IV SOLN
INTRAVENOUS | Status: DC | PRN
  Administered 2013-07-16: 10:00:00 via INTRAVENOUS

## 2013-07-16 MED ORDER — FENTANYL CITRATE 0.05 MG/ML IJ SOLN: 25.0000 ug | INTRAMUSCULAR | Status: DC | PRN

## 2013-07-16 MED ORDER — PROMETHAZINE HCL 25 MG/ML IJ SOLN: 6.2500 mg | INTRAMUSCULAR | Status: DC | PRN

## 2013-07-16 MED ORDER — MEPERIDINE HCL 25 MG/ML IJ SOLN: 6.2500 mg | INTRAMUSCULAR | Status: DC | PRN

## 2013-07-16 MED ORDER — SODIUM HYALURONATE 10 MG/ML IO SOLN
INTRAOCULAR | Status: AC
  Filled 2013-07-16: qty 0.85

## 2013-07-16 MED ORDER — SODIUM HYALURONATE 10 MG/ML IO SOLN
INTRAOCULAR | Status: DC | PRN
  Administered 2013-07-16: 0.85 mL via INTRAOCULAR

## 2013-07-16 MED ORDER — BSS IO SOLN
INTRAOCULAR | Status: DC | PRN
  Administered 2013-07-16: 08:00:00

## 2013-07-16 MED ORDER — FLUORESCEIN SODIUM 1 MG OP STRP
ORAL_STRIP | OPHTHALMIC | Status: AC
  Filled 2013-07-16: qty 1

## 2013-07-16 MED ORDER — GATIFLOXACIN 0.5 % OP SOLN
OPHTHALMIC | Status: AC
  Filled 2013-07-16: qty 2.5

## 2013-07-16 MED ORDER — BSS IO SOLN
INTRAOCULAR | Status: DC | PRN
  Administered 2013-07-16: 15 mL via INTRAOCULAR

## 2013-07-16 MED ORDER — MITOMYCIN 0.2 MG OP KIT
0.2000 mg | PACK | OPHTHALMIC | Status: DC
  Filled 2013-07-16: qty 1

## 2013-07-16 MED ORDER — FENTANYL CITRATE 0.05 MG/ML IJ SOLN
INTRAMUSCULAR | Status: AC
  Filled 2013-07-16: qty 5

## 2013-07-16 MED ORDER — ONDANSETRON HCL 4 MG/2ML IJ SOLN
INTRAMUSCULAR | Status: DC | PRN
  Administered 2013-07-16: 4 mg via INTRAVENOUS

## 2013-07-16 MED ORDER — ACETYLCHOLINE CHLORIDE 1:100 IO SOLR
INTRAOCULAR | Status: AC
  Filled 2013-07-16: qty 1

## 2013-07-16 MED ORDER — BSS IO SOLN
INTRAOCULAR | Status: AC
  Filled 2013-07-16: qty 15

## 2013-07-16 MED ORDER — HYALURONIDASE HUMAN 150 UNIT/ML IJ SOLN
INTRAMUSCULAR | Status: AC
  Filled 2013-07-16: qty 1

## 2013-07-16 MED ORDER — PROPOFOL 10 MG/ML IV BOLUS
INTRAVENOUS | Status: DC | PRN
  Administered 2013-07-16: 40 mg via INTRAVENOUS
  Administered 2013-07-16: 50 mg via INTRAVENOUS

## 2013-07-16 MED ORDER — TOBRAMYCIN 0.3 % OP OINT
TOPICAL_OINTMENT | OPHTHALMIC | Status: DC | PRN
  Administered 2013-07-16: 1 via OPHTHALMIC

## 2013-07-16 MED ORDER — ACETAMINOPHEN 325 MG PO TABS
650.0000 mg | ORAL_TABLET | ORAL | Status: DC | PRN
  Filled 2013-07-16: qty 2

## 2013-07-16 MED ORDER — TOBRAMYCIN-DEXAMETHASONE 0.3-0.1 % OP OINT
TOPICAL_OINTMENT | OPHTHALMIC | Status: AC
  Filled 2013-07-16: qty 3.5

## 2013-07-16 MED ORDER — 0.9 % SODIUM CHLORIDE (POUR BTL) OPTIME
TOPICAL | Status: DC | PRN
  Administered 2013-07-16: 1000 mL

## 2013-07-16 MED ORDER — PROPOFOL 10 MG/ML IV BOLUS
INTRAVENOUS | Status: AC
  Filled 2013-07-16: qty 20

## 2013-07-16 MED ORDER — FENTANYL CITRATE 0.05 MG/ML IJ SOLN
INTRAMUSCULAR | Status: DC | PRN
  Administered 2013-07-16 (×10): 25 ug via INTRAVENOUS

## 2013-07-16 MED ORDER — BUPIVACAINE HCL (PF) 0.75 % IJ SOLN
INTRAMUSCULAR | Status: AC
  Filled 2013-07-16: qty 10

## 2013-07-16 SURGICAL SUPPLY — 55 items
APPLICATOR COTTON TIP 6IN STRL (MISCELLANEOUS) ×3 IMPLANT
APPLICATOR DR MATTHEWS STRL (MISCELLANEOUS) ×3 IMPLANT
BLADE 10 SAFETY STRL DISP (BLADE) ×3 IMPLANT
BLADE KERATOME 2.75 (BLADE) ×2 IMPLANT
BLADE KERATOME 2.75MM (BLADE) ×1
BLADE MINI RND TIP GREEN BEAV (BLADE) IMPLANT
BLADE STAB KNIFE 45DEG (BLADE) IMPLANT
CANNULA ANTERIOR CHAMBER 27GA (MISCELLANEOUS) ×3 IMPLANT
CLOSURE WOUND 1/2 X4 (GAUZE/BANDAGES/DRESSINGS) ×1
CORDS BIPOLAR (ELECTRODE) ×3 IMPLANT
COVER MAYO STAND STRL (DRAPES) ×3 IMPLANT
DRAPE OPHTHALMIC 40X48 W POUCH (DRAPES) ×3 IMPLANT
DRAPE RETRACTOR (MISCELLANEOUS) ×3 IMPLANT
FILTER BLUE MILLIPORE (MISCELLANEOUS) IMPLANT
GLOVE BIO SURGEON STRL SZ8 (GLOVE) ×3 IMPLANT
GLOVE SURG SS PI 6.0 STRL IVOR (GLOVE) ×6 IMPLANT
GLOVE SURG SS PI 7.0 STRL IVOR (GLOVE) ×6 IMPLANT
GLOVE SURG SS PI 7.5 STRL IVOR (GLOVE) ×3 IMPLANT
GOWN STRL REIN XL XLG (GOWN DISPOSABLE) ×9 IMPLANT
GOWN STRL REUS W/ TWL LRG LVL3 (GOWN DISPOSABLE) ×2 IMPLANT
GOWN STRL REUS W/TWL LRG LVL3 (GOWN DISPOSABLE) ×4
KIT BASIN OR (CUSTOM PROCEDURE TRAY) ×3 IMPLANT
KIT ROOM TURNOVER OR (KITS) ×3 IMPLANT
KNIFE CRESCENT 2.5 55 ANG (BLADE) IMPLANT
LENS IOL ACRSF IQ PC 20.0 (Intraocular Lens) ×1 IMPLANT
LENS IOL ACRYSOF IQ POST 20.0 (Intraocular Lens) ×3 IMPLANT
MASK EYE SHIELD (GAUZE/BANDAGES/DRESSINGS) ×3 IMPLANT
NEEDLE 18GX1X1/2 (RX/OR ONLY) (NEEDLE) ×3 IMPLANT
NEEDLE 25GX 5/8IN NON SAFETY (NEEDLE) ×3 IMPLANT
NEEDLE FILTER BLUNT 18X 1/2SAF (NEEDLE) ×2
NEEDLE FILTER BLUNT 18X1 1/2 (NEEDLE) ×1 IMPLANT
NEEDLE HYPO 30X.5 LL (NEEDLE) ×3 IMPLANT
NS IRRIG 1000ML POUR BTL (IV SOLUTION) ×3 IMPLANT
PACK CATARACT CUSTOM (CUSTOM PROCEDURE TRAY) ×3 IMPLANT
PAD ARMBOARD 7.5X6 YLW CONV (MISCELLANEOUS) ×3 IMPLANT
PAK PIK CVS CATARACT (OPHTHALMIC) ×3 IMPLANT
PENCIL BIPOLAR 25GA STR DISP (OPHTHALMIC RELATED) ×3 IMPLANT
PROBE ANTERIOR VITRECTOR (OPHTHALMIC) IMPLANT
SPEAR EYE SURG WECK-CEL (MISCELLANEOUS) IMPLANT
STRIP CLOSURE SKIN 1/2X4 (GAUZE/BANDAGES/DRESSINGS) ×2 IMPLANT
SUT ETHILON 10 0 CS140 6 (SUTURE) ×3 IMPLANT
SUT SILK 4 0 C 3 735G (SUTURE) IMPLANT
SUT SILK 6 0 G 6 (SUTURE) ×3 IMPLANT
SUT VICRYL 8 0 TG140 8 (SUTURE) IMPLANT
SUT VICRYL 9-0 (SUTURE) ×3 IMPLANT
SYR 3ML LL SCALE MARK (SYRINGE) IMPLANT
SYR TB 1ML LUER SLIP (SYRINGE) ×3 IMPLANT
TAPE SURG TRANSPORE 1 IN (GAUZE/BANDAGES/DRESSINGS) ×1 IMPLANT
TAPE SURGICAL TRANSPORE 1 IN (GAUZE/BANDAGES/DRESSINGS) ×2
TIP ABS 45DEG FLARED 0.9MM (TIP) ×3 IMPLANT
TOWEL OR 17X24 6PK STRL BLUE (TOWEL DISPOSABLE) ×6 IMPLANT
TUBE CONNECTING 12'X1/4 (SUCTIONS) ×1
TUBE CONNECTING 12X1/4 (SUCTIONS) ×2 IMPLANT
WATER STERILE IRR 1000ML POUR (IV SOLUTION) ×3 IMPLANT
WIPE INSTRUMENT VISIWIPE 73X73 (MISCELLANEOUS) ×3 IMPLANT

## 2013-07-16 NOTE — H&P (View-Only) (Signed)
History & Physical:   DATE:   06-20-13  NAME:  Jeff [name], Jeff [name]     [id number]       HISTORY OF PRESENT ILLNESS: Chief Eye Complaints blurry vision  INTERFERING WITH WORK    Glaucoma  patient  :    .  HPI: EYES: Reports symptoms of vision disturbances.        LOCATION:   LEFT EYE        QUALITY/COURSE:   Reports condition is worsening.        INTENSITY/SEVERITY:    Reports measurement ( or degree) as severe .      DURATION:   Reports the general length of symptoms to be months.      ONSET/TIMING:   Reports occurrence as continuous.      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   ICD#377.14  Onset: 09/05/2012 11:27  Initial Date:    Posterior subcapsular polar senile cataract   ICD#366.14  Onset: 12/20/2012 13:35  Initial Date:   Left eye Stable post OP glaucoma device with [facility] OD  IOP up & down  OS  SURGERIES: Pick List - Surgeries   [facility]  SLT ou 360 x's 3 OD  Glaucoma Surgery Jan 2013 OS Glaucoma Surgery revision bleb Feb 2013   MEDICATIONS: Neptazane: 50 mg tablet SIG-  2 tabs q am 1 tab qhs   Timolol (Timoptic) Ophthalmic Solution:   0.5% solution  SIG-  1 drop(s)   once a day   Xalatan (Latanoprost) Ophthalmic Solution:    0.005% solution    SIG-   1 drop(s)  drop into each eye     once a day in the PM                Comment-  REVIEW OF SYSTEMS: ROS:   GEN- Constitutional: Negative general-constitutional systems review.      HENT: GEN - Endocrine: Reports symptoms of LUNGS/Respiratory:  HEART/Cardiovascular: Reports symptoms of ABD/Gastrointestinal:   Musculoskeletal (BJE): NEURO/Neurological: PSYCH/Psychiatric:    Is the pt oriented to time, place, person? YES Mood   agitated __  TOBACCO: No exposure  to tobacco.      Never smoker   ICD#V13.89  Tobacco use:     Tobacco cessation:  SOCIAL HISTORY: Married.   UPS  FAMILY HISTORY: Family History - 1st Degree Relatives:  Mother dead.  Father is dead.    ALLERGIES: Alphagan P:  combigan  PHYSICAL EXAMINATION: VS: BMI: 24.7.  BP: 128/82.  H: 72.00 in.  P: 88 /min.  RR: 20 /min.  W: 182lbs 0oz.    Va OD [place] 20/40 PH 20NI+ cc 20/30-2 PH 20/NI OS [place] 20/200 PH 20/NI cc 20/400 PH 20/NI  MR OD:-1.50+1.75 x 060 20/40+ OS -1.00 +0.75 x 050 20/200+1  w/head movement  [name]'s05/02/2013 12:33  OD:42.75 43.75 OS:43.25 44.00  VF:   OD   Full to confrontation testing  OS   Full to confrontation testing     Motility FULL  PUPILS: round OU  EYELIDS & OCULAR ADNEXA normal    SLE: Conjunctiva 180 defuse bleb  -seidel ,localized w/ no leek  bleb Injection OS   Cornea:normal    anterior chamber :Deep and quiet deep w/ tube in good position OU  Iris  normal    Lens  +1  nuclear  sclerosis  OU, central PSC OS   Ta   in mmHg    OD12     OS  36  after DP 27 no Neptazane today Time 06/20/2013 13:04   Gonio OS tube  lumen open   Dilation    Fundus: optic nerve   OD    pale color 85% cup rim intact                                               OS pale 95% cup.  Thin rim   Macula       OD     clear                                                OS  clear  Vessels Normal  Periphery Normal  Exam: GENERAL: Appearance: HEAD, EARS, NOSE AND THROAT: Ears-Nose (external) Inspection: Externally, nose and ears are normal in appearance and without scars, lesions, or nodules.      Hearing assessment shows no problems with normal conversation.      LUNGS and RESPIRATORY: Lung auscultation elicits no wheezing, rhonci, rales or rubs and with equal breath sounds.    Respiratory effort described as breathing is unlabored and chest movement is symmetrical.    HEART (Cardiovascular): Heart  auscultation discovers regular rate and rhythm; no murmur, gallop or rub. Normal heart sounds.    ABDOMEN (Gastrointestinal): Mass/Tenderness Exam: Neither are present.     MUSCULOSKELETAL (BJE): Inspection-Palpation: No major bone, joint, tendon, or muscle changes.      NEUROLOGICAL: Alert and oriented. No major deficits of coordination or sensation.      PSYCHIATRIC: Insight and judgment appear  both to be intact and appropriate.    Mood and affect are described as normal mood and full affect.    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   Use prolensa & ocuflox pre op .  add Xalatan (Latanoprost) Ophthalmic Solution:    0.005% solution    SIG-      OS QHS once a day in the PM     in each eye  Chalmers Guest, Montez Hageman.

## 2013-07-16 NOTE — Interval H&P Note (Signed)
History and Physical Interval Note:  07/16/2013 8:22 AM  Jeff [name]  has presented today for surgery, with the diagnosis of Posterior subcapsular polar senile cataract left eye, and  GLAUCOMA  The various methods of treatment have been discussed with the patient and family. After consideration of risks, benefits and other options for treatment, the patient has consented to  Procedure(s): CATARACT EXTRACTION PHACO AND INTRAOCULAR LENS PLACEMENT (IOC) LEFT EYE (Left) and revision of operative wound Left eye as a surgical intervention .  The patient's history has been reviewed, patient examined, no change in status, stable for surgery.  I have reviewed the patient's chart and labs.  Questions were answered to the patient's satisfaction.     Chalmers Guest

## 2013-07-16 NOTE — Discharge Instructions (Signed)
The eye patch at the left on the eye at all times. Sleep on back or right side did not apply any pressure to the left eye. Avoid heavy lifting bending or straining.

## 2013-07-16 NOTE — Transfer of Care (Signed)
Immediate Anesthesia Transfer of Care Note  Patient: Jeff [name]  Procedure(s) Performed: Procedure(s): CATARACT EXTRACTION PHACO AND INTRAOCULAR LENS PLACEMENT (IOC) LEFT EYE (Left)  Patient Location: PACU  Anesthesia Type:MAC  Level of Consciousness: awake, alert  and oriented  Airway & Oxygen Therapy: Patient Spontanous Breathing and Patient connected to nasal cannula oxygen  Post-op Assessment: Report given to PACU RN, Post -op Vital signs reviewed and stable and Patient moving all extremities X 4  Post vital signs: Reviewed and stable  Complications: No apparent anesthesia complications

## 2013-07-16 NOTE — Anesthesia Preprocedure Evaluation (Addendum)
Anesthesia Evaluation  Patient identified by MRN, date of birth, ID band Patient awake    Reviewed: Allergy & Precautions, H&P , NPO status , Patient's Chart, lab work & pertinent test results  Airway Mallampati: I TM Distance: >3 FB Neck ROM: Full    Dental  (+) Dental Advisory Given, Teeth Intact   Pulmonary neg pulmonary ROS,          Cardiovascular hypertension, Pt. on medications     Neuro/Psych  Headaches, negative psych ROS   GI/Hepatic Neg liver ROS, GERD-  Medicated,  Endo/Other  negative endocrine ROS  Renal/GU negative Renal ROS  negative genitourinary   Musculoskeletal  (+) Arthritis -,   Abdominal   Peds negative pediatric ROS (+)  Hematology negative hematology ROS (+)   Anesthesia Other Findings   Reproductive/Obstetrics negative OB ROS                        Anesthesia Physical Anesthesia Plan  ASA: II  Anesthesia Plan: General   Post-op Pain Management:    Induction: Intravenous  Airway Management Planned: LMA  Additional Equipment:   Intra-op Plan:   Post-operative Plan: Extubation in OR  Informed Consent: I have reviewed the patients History and Physical, chart, labs and discussed the procedure including the risks, benefits and alternatives for the proposed anesthesia with the patient or authorized representative who has indicated his/her understanding and acceptance.   Dental advisory given  Plan Discussed with: CRNA, Anesthesiologist and Surgeon  Anesthesia Plan Comments:       Anesthesia Quick Evaluation                                  Anesthesia Evaluation  Patient identified by MRN, date of birth, ID band Patient awake    Reviewed: Allergy & Precautions, H&P , NPO status , Patient's Chart, lab work & pertinent test results, reviewed documented beta blocker date and time   Airway Mallampati: I TM Distance: >3 FB Neck ROM: Full  Mouth  opening: Limited Mouth Opening  Dental  (+) Teeth Intact   Pulmonary neg pulmonary ROS,          Cardiovascular hypertension, regular Normal No longer being treated for HTN   Neuro/Psych  Headaches, Negative Psych ROS   GI/Hepatic negative GI ROS, Neg liver ROS,   Endo/Other  Negative Endocrine ROS  Renal/GU negative Renal ROS  Genitourinary negative   Musculoskeletal   Abdominal   Peds  Hematology negative hematology ROS (+)   Anesthesia Other Findings   Reproductive/Obstetrics                          Anesthesia Physical Anesthesia Plan  ASA: II  Anesthesia Plan: MAC   Post-op Pain Management:    Induction: Intravenous  Airway Management Planned: Mask  Additional Equipment:   Intra-op Plan:   Post-operative Plan:   Informed Consent: I have reviewed the patients History and Physical, chart, labs and discussed the procedure including the risks, benefits and alternatives for the proposed anesthesia with the patient or authorized representative who has indicated his/her understanding and acceptance.     Plan Discussed with: Anesthesiologist, CRNA and Surgeon  Anesthesia Plan Comments:         Anesthesia Quick Evaluation

## 2013-07-16 NOTE — Anesthesia Postprocedure Evaluation (Signed)
  Anesthesia Post-op Note  Patient: Jeff [name]  Procedure(s) Performed: Procedure(s) (LRB): CATARACT EXTRACTION PHACO AND INTRAOCULAR LENS PLACEMENT (IOC) LEFT EYE (Left)  Patient Location: PACU  Anesthesia Type: MAC  Level of Consciousness: awake and alert   Airway and Oxygen Therapy: Patient Spontanous Breathing  Post-op Pain: mild  Post-op Assessment: Post-op Vital signs reviewed, Patient's Cardiovascular Status Stable, Respiratory Function Stable, Patent Airway and No signs of Nausea or vomiting  Last Vitals:  Filed Vitals:   07/16/13 1058  BP: 149/95  Pulse: 75  Temp:   Resp:     Post-op Vital Signs: stable   Complications: No apparent anesthesia complications

## 2013-07-16 NOTE — Op Note (Signed)
Preoperative diagnosis: Uncontrolled glaucoma following previous glaucoma surgery and visually significant cataract left eye Postoperative diagnosis: Same Procedure: Revision of previous glaucoma surgery with Mitrazol and phacoemulsification with intraocular lens implant left eye Anesthesia: Tubes of Xylocaine 50-50 mixture 0.75% Marcaine with ample Wydase Complications: None Procedure: The patient was transported to the operating room where he was given a peribulbar block with the aforementioned local anesthetic agent following this the patient's face was prepped and draped in the usual sterile fashion with a surgeon sitting temporally and the operating microscope in position a Weck-Cel sponges used to fixate the globe and a 15 blade was used to enter through inferior clear cornea. Viscoat was injected into the anterior chamber it was noted that there was a band localize superior nasal bleb which was aborted doing the cataract portion of the surgery. Following this a Weck-Cel sponge was used to fixate the globe and a 2.75 mm keratome blade was used in a stepwise fashion through temporal cornea to into the anterior chamber additional Viscoat was injected a bent 25-gauge needle was used to incise anterior capsule and a continuous tear curvilinear capsulorrhexis was formed. Following this BSS was used to hydrodissect and hydrodelineate the nucleus the phacoemulsification unit using a Kelman tip was then used to sculpt the nucleus centrally a central bowl old was sculpted the nucleus was rotated and the epinucleus was then totally removed following this the I/A was used to remove the cortical fibers and remaining epinucleus from the eye the posterior capsule remained intact. Following this Provisc was injected in the eye and the intraocular lens implant was examined and noted to have no defects the lens was an Alcon AcrySof SN 60 WF 20.0 diopter lens SN #[id number] the lens is placed in the lens injector and  injected. It was positioned with a Kuglen hook following this the irrigation aspiration device was then used to remove viscoelastic and any remaining cortical fibers from the eye BSS was injected Miochol was injected and the pupil was noted to come down round a single 10-0 nylon suture was placed to achieve watertight closure through the cornea. Following this the operating microscope was then rotated to the 12:00 position a 6-0 nylon suture was passed through clear cornea to infraducted the eye following this an incision was made in the fornix through the conjunctiva excised and through T9 forming a limbus-based conjunctival flap careful dissection was carried beneath the pre-existing bleb it was noted that the scleral flap was completely scarred down using blunt dissection to expose the previous scleral flap a 26-gauge needle was then passed under the scleral flap to elevate the scleral flap the express tube was identified and a Weck-Cel was placed to insure that fluid was again coming through the tube the scleral flap was again position back over the tube the conjunctiva was then closed with a 9-0 Vicryl on a BV 100 needle the anterior chamber remained formed throughout this portion of the procedure BSS was injected in the anterior chamber fluorescein strip was used to stain the incision and the bleb area and all areas were Seidel negative therefore topical TobraDex ointment was applied to the eye the fixation suture was removed a patch and Fox U. were placed and the patient returned to recovery area in stable condition [employer] M.D.

## 2013-07-16 NOTE — Anesthesia Procedure Notes (Signed)
Procedure Name: MAC Date/Time: 07/16/2013 8:45 AM Performed by: Vita Barley E Pre-anesthesia Checklist: Patient identified, Emergency Drugs available, Suction available and Patient being monitored Patient Re-evaluated:Patient Re-evaluated prior to inductionOxygen Delivery Method: Nasal cannula Preoxygenation: Pre-oxygenation with 100% oxygen Intubation Type: IV induction Placement Confirmation: positive ETCO2 and breath sounds checked- equal and bilateral Dental Injury: Teeth and Oropharynx as per pre-operative assessment

## 2013-07-17 ENCOUNTER — Encounter (HOSPITAL_COMMUNITY): Payer: Self-pay | Admitting: Ophthalmology

## 2013-07-31 ENCOUNTER — Other Ambulatory Visit: Payer: Self-pay | Admitting: Internal Medicine

## 2013-07-31 NOTE — Telephone Encounter (Signed)
Refill

## 2013-09-01 ENCOUNTER — Telehealth: Payer: Self-pay | Admitting: Internal Medicine

## 2013-09-01 NOTE — Telephone Encounter (Signed)
Have you seen and MRI report from opthalmology on this patient?

## 2013-09-01 NOTE — Telephone Encounter (Signed)
No I have not

## 2013-09-02 NOTE — Telephone Encounter (Signed)
Patient stated to Maia Breslowitcha he was calling his opthalmology to have records sent.

## 2013-09-04 ENCOUNTER — Telehealth: Payer: Self-pay | Admitting: Internal Medicine

## 2013-09-04 NOTE — Telephone Encounter (Signed)
Patient has called and left voice message, returned call to patient he is concerned about readings of an MRI that the report stated for age brain mass is much smaller than for normal age. Patient is going to retain a copy of  report for MD, Opthamologist  is telling patient he is not sure what the next step should be for  Patient should patient have a neurology referral?

## 2013-09-05 NOTE — Telephone Encounter (Signed)
Possibily,  And I will be happy to make a referral,  But I would like to see the MRi report first and know why his ophthalmologisg ordered an MRI in the first place before I make a referral.

## 2013-09-08 NOTE — Telephone Encounter (Signed)
Patient to have MRI faxed over.

## 2013-09-12 ENCOUNTER — Ambulatory Visit (INDEPENDENT_AMBULATORY_CARE_PROVIDER_SITE_OTHER): Payer: BC Managed Care – PPO | Admitting: Neurology

## 2013-09-12 ENCOUNTER — Encounter: Payer: Self-pay | Admitting: Neurology

## 2013-09-12 VITALS — BP 158/98 | HR 73 | Ht 71.0 in | Wt 190.0 lb

## 2013-09-12 DIAGNOSIS — R51 Headache: Secondary | ICD-10-CM

## 2013-09-12 DIAGNOSIS — R519 Headache, unspecified: Secondary | ICD-10-CM | POA: Insufficient documentation

## 2013-09-12 MED ORDER — NORTRIPTYLINE HCL 10 MG PO CAPS: ORAL_CAPSULE | ORAL | Status: DC

## 2013-09-12 NOTE — Progress Notes (Signed)
Reason for visit: Headache  Keturah BarreWilliam K Schmiesing is a 53 y.o. male  History of present illness:  Mr. Lesli AlbeeKime is a 53 year old right-handed white male with a history of glaucoma requiring surgical therapy bilaterally. The patient indicates that he has had some headaches since he was a child. The headaches when he was younger was associated with a throbbing sensation, and a more recent headaches are not similar to this. He has headaches 2 or 3 times a week, that are mainly in the frontal and temporal areas. He describes the headache as being a pressure sensation, without throbbing. He has no nausea or vomiting, but he does report some problems with altered vision associated with glaucoma, and he believes that the headaches often times come on when he is trying to strain to read something. The patient indicates that he can take Advil and resolve the headache rapidly. He denies any numbness or weakness of the face, arms, or legs. He does note some slight imbalance issues, and he has some problems with urinary frequency and urgency, and he will be evaluated by a urologist in the near future. He does report some neck discomfort and some slight occipital discomfort. 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 He has had headaches from TMJ problems in the past. The patient has undergone a MRI evaluation of the brain that is brought for my review. This does show some global atrophy, without significant white matter changes. No significant sinus disease is noted. [name] sent to this office for an evaluation.  Past Medical History  Diagnosis Date  . Hypertension     no meds  . Glaucoma (increased eye pressure)   . Headache(784.0)     07/11/13- none in a long time  . GERD (gastroesophageal reflux disease)     with certain foods  . Arthritis     Past Surgical History  Procedure Laterality Date  . No past surgeries    . Mini shunt insertion   03/09/2011    Procedure: INSERTION OF MINI SHUNT;  Surgeon: Chalmers Guestoy Whitaker, MD;  [facility];  Service: Ophthalmology;  Laterality: Left;  . Finger surgery Right   . Eye surgery      2 on left eye  . Cataract extraction w/phaco Left 07/16/2013    Procedure: CATARACT EXTRACTION PHACO AND INTRAOCULAR LENS PLACEMENT (IOC) LEFT EYE;  Surgeon: Chalmers Guestoy Whitaker, MD;  [facility];  Service: Ophthalmology;  Laterality: Left;    Family History  Problem Relation Age of Onset  . Asthma Mother   . Hyperlipidemia Mother   . Heart disease Mother   . Hypertension Mother   . Asthma Father   . Hyperlipidemia Father   . Heart disease Father   . Stroke Father   . Hypertension Father   . Aneurysm Father   . Asthma Paternal Aunt   . Heart disease Sister   . Early death Sister   . Aneurysm Brother     non smoker   . Early death Brother   . Migraines Neg Hx     Social history:  reports that he has never smoked. He has never used smokeless tobacco. He reports that he drinks about 3.6 - 4.8 ounces of alcohol per week. He reports that he does not use illicit drugs.  Medications:  Current Outpatient Prescriptions on File Prior to Visit  Medication Sig Dispense Refill  . allopurinol (ZYLOPRIM)  300 MG tablet Take 150 mg by mouth daily.      Marland Kitchen amLODipine (NORVASC) 10 MG tablet Take 10 mg by mouth daily.      . colchicine 0.6 MG tablet Take 0.6 mg by mouth daily as needed (Only for a flare up).      Marland Kitchen ibuprofen (ADVIL,MOTRIN) 600 MG tablet Take 600 mg by mouth every 6 (six) hours as needed for mild pain.      Marland Kitchen ibuprofen (ADVIL,MOTRIN) 600 MG tablet TAKE 1 TABLET BY MOUTH 3 TIMES A DAY AS NEEDED FOR GOUT FLARE  60 tablet  1  . loperamide (IMODIUM A-D) 2 MG tablet Take 2 mg by mouth daily as needed for diarrhea or loose stools.      . traZODone (DESYREL) 50 MG tablet Take 50 mg by mouth daily as needed for sleep.       No current facility-administered medications on file prior to visit.      Allergies   Allergen Reactions  . Keflex [Cephalexin] Rash    ROS:  Out of a complete 14 system review of symptoms, the patient complains only of the following symptoms, and all other reviewed systems are negative.  Swelling in the legs Blurred vision, double vision, loss of vision, eye pain Cough Urination problems Memory loss, headache Not enough sleep Insomnia, restless legs  Blood pressure 158/98, pulse 73, height 5\' 11"  (1.803 m), weight 190 lb (86.183 kg).  Physical Exam  General: [name] alert and cooperative at the time of the examination.  Eyes: Pupils are post-surgical, and reactive to light. Discs are flat bilaterally.  Neck: The neck is supple, no carotid bruits are noted.  Respiratory: The respiratory examination is clear.  Cardiovascular: The cardiovascular examination reveals a regular rate and rhythm, no obvious murmurs or rubs are noted.  Neuromuscular: The patient has good range of movement of the cervical spine. There is some crepitus bilaterally in the temporomandibular joints.  Skin: Extremities are without significant edema.  Neurologic Exam  Mental status: [name] alert and oriented x 3 at the time of the examination. The patient has apparent normal recent and remote memory, with an apparently normal attention span and concentration ability.  Cranial nerves: Facial symmetry is present. There is good sensation of the face to pinprick and soft touch bilaterally. The strength of the facial muscles and the muscles to head turning and shoulder shrug are normal bilaterally. Speech is well enunciated, no aphasia or dysarthria is noted. Extraocular movements are full. Visual fields are full. The tongue is midline, and the patient has symmetric elevation of the soft palate. No obvious hearing deficits are noted.  Motor: The motor testing reveals 5 over 5 strength of all 4 extremities. Good symmetric motor tone is noted throughout.  Sensory: Sensory testing  is intact to pinprick, soft touch, vibration sensation, and position sense on all 4 extremities. No evidence of extinction is noted.  Coordination: Cerebellar testing reveals good finger-nose-finger and heel-to-shin bilaterally.  Gait and station: Gait is normal. Tandem gait is normal. Romberg is negative. No drift is seen.  Reflexes: Deep tendon reflexes are symmetric and normal bilaterally. Toes are downgoing bilaterally.   Assessment/Plan:  1. History of glaucoma  2. History of migraine headache  3. Probable muscle tension headache  The patient likely is suffering from a form of muscle tension headache. The patient relates the headaches to eye strain frequently. The patient will be started on low-dose nortriptyline, and he will take ibuprofen  if needed for the headache. He will followup in 3 or 4 months. He is to contact our office if he is not tolerating the medication.  Marlan Palau MD 09/13/2013 1:04 PM  [facility] [address] [place], [place] [address]  Phone [phone] Fax [phone]

## 2013-09-12 NOTE — Patient Instructions (Signed)

## 2013-09-22 ENCOUNTER — Other Ambulatory Visit: Payer: Self-pay | Admitting: Internal Medicine

## 2013-10-21 DIAGNOSIS — H401190 Primary open-angle glaucoma, unspecified eye, stage unspecified: Secondary | ICD-10-CM | POA: Insufficient documentation

## 2013-10-28 ENCOUNTER — Encounter (HOSPITAL_COMMUNITY): Payer: Self-pay | Admitting: *Deleted

## 2013-10-28 ENCOUNTER — Other Ambulatory Visit: Payer: Self-pay | Admitting: Ophthalmology

## 2013-10-28 ENCOUNTER — Encounter (HOSPITAL_COMMUNITY): Payer: Self-pay | Admitting: Pharmacy Technician

## 2013-10-28 MED ORDER — CYCLOPENTOLATE HCL 1 % OP SOLN
1.0000 [drp] | OPHTHALMIC | Status: AC
Start: 2013-10-29 — End: 2013-10-29
  Administered 2013-10-29 (×3): 1 [drp] via OPHTHALMIC
  Filled 2013-10-28: qty 2

## 2013-10-28 MED ORDER — TROPICAMIDE 1 % OP SOLN
1.0000 [drp] | OPHTHALMIC | Status: AC
  Administered 2013-10-29 (×3): 1 [drp] via OPHTHALMIC
  Filled 2013-10-28: qty 3

## 2013-10-28 MED ORDER — TETRACAINE HCL 0.5 % OP SOLN: 1.0000 [drp] | OPHTHALMIC | Status: DC

## 2013-10-28 MED ORDER — PHENYLEPHRINE HCL 2.5 % OP SOLN
1.0000 [drp] | OPHTHALMIC | Status: AC
  Administered 2013-10-29 (×3): 1 [drp] via OPHTHALMIC
  Filled 2013-10-28: qty 2

## 2013-10-28 MED ORDER — KETOROLAC TROMETHAMINE 0.5 % OP SOLN
1.0000 [drp] | OPHTHALMIC | Status: AC
  Administered 2013-10-29 (×3): 1 [drp] via OPHTHALMIC
  Filled 2013-10-28: qty 5

## 2013-10-28 MED ORDER — GATIFLOXACIN 0.5 % OP SOLN
1.0000 [drp] | OPHTHALMIC | Status: AC | PRN
  Administered 2013-10-29 (×3): 1 [drp] via OPHTHALMIC
  Filled 2013-10-28: qty 2.5

## 2013-10-28 NOTE — Progress Notes (Signed)
10/28/13 1104  OBSTRUCTIVE SLEEP APNEA  Have you ever been diagnosed with sleep apnea through a sleep study? No  Do you snore loudly (loud enough to be heard through closed doors)?  0  Do you often feel tired, fatigued, or sleepy during the daytime? 1  Has anyone observed you stop breathing during your sleep? 0  Do you have, or are you being treated for high blood pressure? 1  BMI more than 35 kg/m2? 0  Age over 51 years old? 1  Gender: 1  Obstructive Sleep Apnea Score 4  Score 4 or greater  Results sent to PCP

## 2013-10-28 NOTE — H&P (Signed)
History & Physical:   DATE:   10-24-2013  NAME:  Jeff [name], Jeff [name]     [id number]       HISTORY OF PRESENT ILLNESS: Chief Eye Complaints blurry vision . OD interferes with driving can't see to worrk. Patient states I did a series of test at Dr. Lesle Reek office. Patient c/o having the usual discomfort, OS feels irritated in the am. Patient c/o have twinge every now and then. Va is terrible per pt. Patient would like to discuss about cataract sx OS.    HPI: EYES: Reports symptoms of vision disturbances.        LOCATION: BOTH EYES        QUALITY/COURSE:   Reports condition is worsening.        INTENSITY/SEVERITY:    Reports measurement ( or degree) as severe .      DURATION:   Reports the general length of symptoms to be months.      ONSET/TIMING:   Reports occurrence as continuous.      CONTEXT/WHEN:   Reports usually associated with   MODIFIERS/TREATMENTS:  Improved by              ACTIVE PROBLEMS: Posterior subcapsular polar senile cataract   ICD10:   ICD9: 366.14  OD The vision has recently gotten worse OD .  patient  was seen at [facility] for a 2nd opinion and glaucoma consultant agreed that  patient 's visual loss is from cataract OD and suggest cataract surgery OD.  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   ICD10:   ICD9: 377.14  Onset: 09/05/2012 11:27  Initial Date:    Stable following phacoemulsification and revision of glaucoma surgery Left eye Pseudophakia   ICD10: Z96.1 ICD9: 446.0 Onset: 10/24/2013 13:02 Initial Date:    Headache (includes chronic daily) nonspecific or mixed ICD9: 784.0  Onset: 08/11/2013 13:35  Initial Date:   ICD10: R51  Peripheral visual field defect   ICD9: 368.44  Onset: 08/11/2013 13:35  Initial Date:   ICD10:    OD Glaucoma, severe stage   ICD9: 365.73  OS IOP too high  Onset: 09/05/2012 11:27  Initial Date:   ICD10:  SURGERIES:   phaco emulsion cataract extraction  & bleb 07-16-13 OS GLAUCOMA DEVICE WITH [facility] 08-21-12   SLT ou 360 x's 3 OD  Glaucoma Surgery Jan 2013 Glaucoma Surgery revision bleb Feb 2013 Finger Surgery Aug 2013  MEDICATIONS: Trazodone:   100 mg tablet  SIG-  1 each   once a day (at bedtime)   Amlodipine (Norvasc):   10 mg tablet  SIG-  1 each   once a day   Ciloxan (Ciprofloxacin) Solution: 0.3% solution SIG-  2 drop(s)  every 4 hours   OD  Prolensa: Strength-  SIG-   OD  Durezol: 0.05% emulsion SIG-  1 drop in affected eye 6 times a day  6 times a day  OS  Vicodin HP: 300 mg-10 mg tablet SIG-  1 tab(s) orally every 6 hours  REVIEW OF SYSTEMS: ROS:   GEN- Constitutional: Negative general-constitutional systems review.      HENT: GEN - Endocrine: Reports symptoms of LUNGS/Respiratory:  HEART/Cardiovascular: Reports symptoms of ABD/Gastrointestinal:   Musculoskeletal (BJE): NEURO/Neurological: PSYCH/Psychiatric:    Is the pt oriented to time, place, person? YES Mood   agitated __  TOBACCO: No exposure to tobacco.  Never smoker   ICD9: V13.89  Tobacco use:     Tobacco cessation:  SOCIAL HISTORY: Married.   UPS  FAMILY HISTORY: Family History - 1st Degree Relatives:  Mother dead.  Father is dead.    ALLERGIES: Alphagan P:  combigan  PHYSICAL EXAMINATION: VS: BMI: 24.7.  BP: 128/82.  H: 72.00 in.  P: 88 /min.  RR: 20 /min.  W: 182lbs 0oz.    Va OD [place] 20/60 fuzzy PH 20/NI OS  20/200 blurry PH 20/NI   MR OD:-0.50  +1.00 X 046  NI OS:NI  K's05/02/2013 12:33  OD:42.75 43.75 OS:43.25 44.00  VF:   OD   Full to confrontation testing                                                 OS   Decreased vision superior and inferior temporally    Motility FULL  PUPILS: round OU  EYELIDS & OCULAR ADNEXA normal    SLE: Conjunctiva  -seidel, elevated  bleb OD, 180 defuse bleb  w/ no leak    bleb elevated, - seidel,  +1  Injection loose suture noted OS   Cornea:normal OD  Decreased Tear Break-Up Time OD   anterior chamber :Deep and quiet OU tubes in  good position  Iris  normal    Lens  +1  nuclear  sclerosis& +2-3 psc OD,  posterior chamber intraocular lens implant in good position OS   Ta   in mmHg    OD ,14    OS  10  Time09/05/2013 13:44   Gonio OS tube  lumen open   Dilation   Fundus: \optic nerve   OD    pale color 85% cup rim intact, temporal pallor                                          OS pale 95% cup.  Thin rim   Macula       OD     clear                                                OS  clear  Vessels Normal  Periphery Normal   Visual Fields 08/05/2013 12:57  OD:  paracentral has one spot that's worst, nasal depression   Exam: GENERAL: Appearance: HEAD, EARS, NOSE AND THROAT: Ears-Nose (external) Inspection: Externally, nose and ears are normal in appearance and without scars, lesions, or nodules.      Hearing assessment shows no problems with normal conversation.      LUNGS and RESPIRATORY: Lung auscultation elicits no wheezing, rhonci, rales or rubs and with equal breath sounds.    Respiratory effort described as breathing is unlabored and chest movement is symmetrical.    HEART (Cardiovascular): Heart auscultation discovers regular rate and rhythm; no murmur, gallop or rub. Normal heart sounds.    ABDOMEN (Gastrointestinal): Mass/Tenderness Exam: Neither are present.     MUSCULOSKELETAL (BJE): Inspection-Palpation: No major bone, joint, tendon, or muscle changes.      NEUROLOGICAL: Alert and oriented. No major deficits of coordination or sensation.  PSYCHIATRIC: Insight and judgment appear  both to be intact and appropriate.    Mood and affect are described as normal mood and full affect.    SKIN: Skin Inspection: No rashes or lesions  ADMITTING DIAGNOSIS: Posterior subcapsular polar senile cataract   ICD10:   ICD9: 366.14  OD The vision has recently gotten worse OD .  patient  was seen at [facility] for a 2nd opinion and glaucoma consultant agreed that  patient 's visual loss is from  cataract OD and suggest cataract surgery OD.  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   ICD10:   ICD9: 377.14  Onset: 09/05/2012 11:27  Initial Date:    Stable following phacoemulsification and revision of glaucoma surgery Left eye  Pseudophakia   ICD10: Z96.1 ICD9: 446.0 Onset: 10/24/2013 13:02   Headache (includes chronic daily) nonspecific or mixed ICD9: 784.0  Onset: 08/11/2013 13:35   Peripheral visual field defect   ICD9: 368.44  Onset: 08/11/2013 13:35    OD Glaucoma, severe stage   ICD9: 365.73  OS IOP too high  Onset: 09/05/2012 11:27  SURGICAL TREATMENT PLAN: phaco emulsion cataract extraction  w  intraocular lens implant  OD   Risk and benefits of surgery have been reviewed with the patient and the patient agrees to proceed with the surgical procedure.    ___________________________ Chalmers Guest, Tawni Millers - Inactive Problems:

## 2013-10-29 ENCOUNTER — Encounter (HOSPITAL_COMMUNITY): Payer: BC Managed Care – PPO | Admitting: Anesthesiology

## 2013-10-29 ENCOUNTER — Encounter (HOSPITAL_COMMUNITY): Payer: Self-pay | Admitting: Surgery

## 2013-10-29 ENCOUNTER — Ambulatory Visit (HOSPITAL_COMMUNITY)
Admission: RE | Admit: 2013-10-29 | Discharge: 2013-10-29 | Disposition: A | Discharge status: Home / Self Care | Payer: BC Managed Care – PPO | Source: Ambulatory Visit | Attending: Ophthalmology | Admitting: Ophthalmology

## 2013-10-29 ENCOUNTER — Encounter (HOSPITAL_COMMUNITY)
Admission: RE | Disposition: A | Discharge status: Home / Self Care | Payer: Self-pay | Source: Ambulatory Visit | Attending: Ophthalmology

## 2013-10-29 ENCOUNTER — Ambulatory Visit (HOSPITAL_COMMUNITY): Payer: BC Managed Care – PPO | Admitting: Anesthesiology

## 2013-10-29 DIAGNOSIS — H409 Unspecified glaucoma: Secondary | ICD-10-CM | POA: Diagnosis not present

## 2013-10-29 DIAGNOSIS — H47239 Glaucomatous optic atrophy, unspecified eye: Secondary | ICD-10-CM | POA: Insufficient documentation

## 2013-10-29 DIAGNOSIS — H25049 Posterior subcapsular polar age-related cataract, unspecified eye: Secondary | ICD-10-CM | POA: Diagnosis present

## 2013-10-29 DIAGNOSIS — H53459 Other localized visual field defect, unspecified eye: Secondary | ICD-10-CM | POA: Insufficient documentation

## 2013-10-29 HISTORY — DX: Claustrophobia: F40.240

## 2013-10-29 HISTORY — PX: CATARACT EXTRACTION W/PHACO: SHX586

## 2013-10-29 LAB — BASIC METABOLIC PANEL
Anion gap: 14 (ref 5–15)
BUN: 12 mg/dL (ref 6–23)
CO2: 21 mEq/L (ref 19–32)
Calcium: 8.8 mg/dL (ref 8.4–10.5)
Chloride: 104 mEq/L (ref 96–112)
Creatinine, Ser: 0.83 mg/dL (ref 0.50–1.35)
GFR calc Af Amer: >90 mL/min (ref >90)
GFR calc non Af Amer: >90 mL/min (ref >90)
GLUCOSE: 110 mg/dL — AB (ref 70–99)
POTASSIUM: 4.5 meq/L (ref 3.7–5.3)
Sodium: 139 mEq/L (ref 137–147)

## 2013-10-29 LAB — CBC
HCT: 44.6 % (ref 39.0–52.0)
Hemoglobin: 14.9 g/dL (ref 13.0–17.0)
MCH: 31.1 pg (ref 26.0–34.0)
MCHC: 33.4 g/dL (ref 30.0–36.0)
MCV: 93.1 fL (ref 78.0–100.0)
PLATELETS: 207 10*3/uL (ref 150–400)
RBC: 4.79 MIL/uL (ref 4.22–5.81)
RDW: 13.6 % (ref 11.5–15.5)
WBC: 5.2 10*3/uL (ref 4.0–10.5)

## 2013-10-29 SURGERY — PHACOEMULSIFICATION, CATARACT, WITH IOL INSERTION
Anesthesia: Monitor Anesthesia Care | Site: Eye | Laterality: Right

## 2013-10-29 MED ORDER — FENTANYL CITRATE 0.05 MG/ML IJ SOLN
INTRAMUSCULAR | Status: DC | PRN
  Administered 2013-10-29: 100 ug via INTRAVENOUS
  Administered 2013-10-29 (×3): 50 ug via INTRAVENOUS

## 2013-10-29 MED ORDER — LIDOCAINE HCL 2 % IJ SOLN
INTRAMUSCULAR | Status: AC
  Filled 2013-10-29: qty 20

## 2013-10-29 MED ORDER — PROPOFOL 10 MG/ML IV BOLUS
INTRAVENOUS | Status: DC | PRN
  Administered 2013-10-29: 40 mg via INTRAVENOUS
  Administered 2013-10-29: 20 mg via INTRAVENOUS

## 2013-10-29 MED ORDER — PROPOFOL 10 MG/ML IV BOLUS
INTRAVENOUS | Status: AC
  Filled 2013-10-29: qty 20

## 2013-10-29 MED ORDER — LIDOCAINE HCL (CARDIAC) 20 MG/ML IV SOLN
INTRAVENOUS | Status: AC
  Filled 2013-10-29: qty 5

## 2013-10-29 MED ORDER — BSS IO SOLN
INTRAOCULAR | Status: DC | PRN
Start: 2013-10-29 — End: 2013-10-29
  Administered 2013-10-29: 15 mL

## 2013-10-29 MED ORDER — TETRACAINE HCL 0.5 % OP SOLN
OPHTHALMIC | Status: AC
  Filled 2013-10-29: qty 2

## 2013-10-29 MED ORDER — ONDANSETRON HCL 4 MG/2ML IJ SOLN
INTRAMUSCULAR | Status: DC | PRN
  Administered 2013-10-29: 4 mg via INTRAVENOUS

## 2013-10-29 MED ORDER — BSS IO SOLN
INTRAOCULAR | Status: AC
  Filled 2013-10-29: qty 500

## 2013-10-29 MED ORDER — HYALURONIDASE HUMAN 150 UNIT/ML IJ SOLN
INTRAMUSCULAR | Status: AC
  Filled 2013-10-29: qty 1

## 2013-10-29 MED ORDER — DEXAMETHASONE SODIUM PHOSPHATE 10 MG/ML IJ SOLN
INTRAMUSCULAR | Status: AC
  Filled 2013-10-29: qty 1

## 2013-10-29 MED ORDER — ONDANSETRON HCL 4 MG/2ML IJ SOLN
INTRAMUSCULAR | Status: AC
  Filled 2013-10-29: qty 2

## 2013-10-29 MED ORDER — TOBRAMYCIN 0.3 % OP OINT
TOPICAL_OINTMENT | OPHTHALMIC | Status: DC | PRN
  Administered 2013-10-29: 1 via OPHTHALMIC

## 2013-10-29 MED ORDER — ACETYLCHOLINE CHLORIDE 1:100 IO SOLR
INTRAOCULAR | Status: DC | PRN
  Administered 2013-10-29: 20 mg via INTRAOCULAR

## 2013-10-29 MED ORDER — SODIUM HYALURONATE 10 MG/ML IO SOLN
INTRAOCULAR | Status: AC
  Filled 2013-10-29: qty 0.85

## 2013-10-29 MED ORDER — TETRACAINE HCL 0.5 % OP SOLN
OPHTHALMIC | Status: DC | PRN
  Administered 2013-10-29: 1 [drp] via OPHTHALMIC

## 2013-10-29 MED ORDER — EPINEPHRINE HCL 1 MG/ML IJ SOLN
INTRAMUSCULAR | Status: AC
  Filled 2013-10-29: qty 1

## 2013-10-29 MED ORDER — NA CHONDROIT SULF-NA HYALURON 40-30 MG/ML IO SOLN
INTRAOCULAR | Status: AC
  Filled 2013-10-29: qty 0.5

## 2013-10-29 MED ORDER — MIDAZOLAM HCL 2 MG/2ML IJ SOLN
INTRAMUSCULAR | Status: AC
  Filled 2013-10-29: qty 2

## 2013-10-29 MED ORDER — BUPIVACAINE HCL (PF) 0.75 % IJ SOLN
INTRAMUSCULAR | Status: AC
  Filled 2013-10-29: qty 10

## 2013-10-29 MED ORDER — FENTANYL CITRATE 0.05 MG/ML IJ SOLN
INTRAMUSCULAR | Status: AC
  Filled 2013-10-29: qty 5

## 2013-10-29 MED ORDER — EPINEPHRINE HCL 1 MG/ML IJ SOLN
INTRAMUSCULAR | Status: DC | PRN
  Administered 2013-10-29: 12:00:00

## 2013-10-29 MED ORDER — LIDOCAINE-EPINEPHRINE 2 %-1:100000 IJ SOLN
INTRAMUSCULAR | Status: DC | PRN
  Administered 2013-10-29: 12:00:00 via RETROBULBAR

## 2013-10-29 MED ORDER — SODIUM HYALURONATE 10 MG/ML IO SOLN
INTRAOCULAR | Status: DC | PRN
  Administered 2013-10-29: 0.85 mL via INTRAOCULAR

## 2013-10-29 MED ORDER — ACETYLCHOLINE CHLORIDE 1:100 IO SOLR
INTRAOCULAR | Status: AC
  Filled 2013-10-29: qty 1

## 2013-10-29 MED ORDER — TOBRAMYCIN-DEXAMETHASONE 0.3-0.1 % OP OINT
TOPICAL_OINTMENT | OPHTHALMIC | Status: AC
  Filled 2013-10-29: qty 3.5

## 2013-10-29 MED ORDER — PILOCARPINE HCL 4 % OP SOLN
OPHTHALMIC | Status: AC
  Filled 2013-10-29: qty 15

## 2013-10-29 MED ORDER — 0.9 % SODIUM CHLORIDE (POUR BTL) OPTIME
TOPICAL | Status: DC | PRN
Start: 2013-10-29 — End: 2013-10-29
  Administered 2013-10-29: 100 mL

## 2013-10-29 MED ORDER — NA CHONDROIT SULF-NA HYALURON 40-30 MG/ML IO SOLN
INTRAOCULAR | Status: DC | PRN
  Administered 2013-10-29: 0.5 mL via INTRAOCULAR

## 2013-10-29 MED ORDER — BSS IO SOLN
INTRAOCULAR | Status: AC
  Filled 2013-10-29: qty 15

## 2013-10-29 MED ORDER — SODIUM CHLORIDE 0.9 % IV SOLN
INTRAVENOUS | Status: DC
  Administered 2013-10-29: 10:00:00 via INTRAVENOUS

## 2013-10-29 MED ORDER — MIDAZOLAM HCL 5 MG/5ML IJ SOLN
INTRAMUSCULAR | Status: DC | PRN
  Administered 2013-10-29: 2 mg via INTRAVENOUS

## 2013-10-29 MED ORDER — SODIUM CHLORIDE 0.9 % IV SOLN
INTRAVENOUS | Status: DC | PRN
  Administered 2013-10-29: 12:00:00 via INTRAVENOUS

## 2013-10-29 MED ORDER — GENTAMICIN SULFATE 40 MG/ML IJ SOLN
INTRAMUSCULAR | Status: AC
  Filled 2013-10-29: qty 2

## 2013-10-29 SURGICAL SUPPLY — 32 items
APPLICATOR COTTON TIP 6IN STRL (MISCELLANEOUS) ×3 IMPLANT
APPLICATOR DR MATTHEWS STRL (MISCELLANEOUS) ×3 IMPLANT
BLADE KERATOME 2.75 (BLADE) ×2 IMPLANT
BLADE KERATOME 2.75MM (BLADE) ×1
CANNULA ANTERIOR CHAMBER 27GA (MISCELLANEOUS) ×3 IMPLANT
CORDS BIPOLAR (ELECTRODE) IMPLANT
COVER MAYO STAND STRL (DRAPES) ×3 IMPLANT
DRAPE OPHTHALMIC 40X48 W POUCH (DRAPES) ×3 IMPLANT
DRAPE RETRACTOR (MISCELLANEOUS) ×3 IMPLANT
GLOVE BIO SURGEON STRL SZ8 (GLOVE) ×3 IMPLANT
GOWN STRL REUS W/ TWL LRG LVL3 (GOWN DISPOSABLE) ×3 IMPLANT
GOWN STRL REUS W/TWL LRG LVL3 (GOWN DISPOSABLE) ×6
KIT BASIN OR (CUSTOM PROCEDURE TRAY) ×3 IMPLANT
KIT ROOM TURNOVER OR (KITS) ×3 IMPLANT
LENS IOL ACRSF IQ PC 20.5 (Intraocular Lens) ×1 IMPLANT
LENS IOL ACRYSOF IQ POST 20.5 (Intraocular Lens) ×3 IMPLANT
NEEDLE 18GX1X1/2 (RX/OR ONLY) (NEEDLE) ×3 IMPLANT
NEEDLE 25GX 5/8IN NON SAFETY (NEEDLE) ×3 IMPLANT
NEEDLE FILTER BLUNT 18X 1/2SAF (NEEDLE) ×2
NEEDLE FILTER BLUNT 18X1 1/2 (NEEDLE) ×1 IMPLANT
NS IRRIG 1000ML POUR BTL (IV SOLUTION) ×3 IMPLANT
PACK CATARACT CUSTOM (CUSTOM PROCEDURE TRAY) ×3 IMPLANT
PAD ARMBOARD 7.5X6 YLW CONV (MISCELLANEOUS) ×3 IMPLANT
PAK PIK CVS CATARACT (OPHTHALMIC) ×3 IMPLANT
SUT ETHILON 10 0 CS140 6 (SUTURE) ×3 IMPLANT
SUT SILK 6 0 G 6 (SUTURE) IMPLANT
SYR TB 1ML LUER SLIP (SYRINGE) ×3 IMPLANT
TAPE PAPER MEDFIX 1IN X 10YD (GAUZE/BANDAGES/DRESSINGS) ×3 IMPLANT
TIP ABS 45DEG FLARED 0.9MM (TIP) ×3 IMPLANT
TOWEL OR 17X26 10 PK STRL BLUE (TOWEL DISPOSABLE) ×3 IMPLANT
WATER STERILE IRR 1000ML POUR (IV SOLUTION) ×3 IMPLANT
WIPE INSTRUMENT VISIWIPE 73X73 (MISCELLANEOUS) ×3 IMPLANT

## 2013-10-29 NOTE — Discharge Instructions (Addendum)
What to eat:  For your first meals, you should eat lightly; only small meals initially.  If you do not have nausea, you may eat larger meals.  Avoid spicy, greasy and heavy food.    General Anesthesia, Adult, Care After  Refer to this sheet in the next few weeks. These instructions provide you with information on caring for yourself after your procedure. Your health care provider may also give you more specific instructions. Your treatment has been planned according to current medical practices, but problems sometimes occur. Call your health care provider if you have any problems or questions after your procedure.  WHAT TO EXPECT AFTER THE PROCEDURE  After the procedure, it is typical to experience:  Sleepiness.  Nausea and vomiting. HOME CARE INSTRUCTIONS  For the first 24 hours after general anesthesia:  Have a responsible person with you.  Do not drive a car. If you are alone, do not take public transportation.  Do not drink alcohol.  Do not take medicine that has not been prescribed by your health care provider.  Do not sign important papers or make important decisions.  You may resume a normal diet and activities as directed by your health care provider.  Change bandages (dressings) as directed.  If you have questions or problems that seem related to general anesthesia, call the hospital and ask for the anesthetist or anesthesiologist on call. SEEK MEDICAL CARE IF:  You have nausea and vomiting that continue the day after anesthesia.  You develop a rash. SEEK IMMEDIATE MEDICAL CARE IF:  You have difficulty breathing.  You have chest pain.  You have any allergic problems. Document Released: 05/15/2000 Document Revised: 10/09/2012 Document Reviewed: 08/22/2012  [facility] Patient Information 2014 [place], [place].   The patient may remove the eye patch at 4:00 today. He should instill 2 eye drops that were given to him at the doctor's office. The patient can wear eyeglasses sunglasses  or eye shield over the eye at all times. He should sleep on his back or left side. Avoid rubbing the right eye. Take Tylenol for pain if this does not relieve the pain the patient should call doctor's office.

## 2013-10-29 NOTE — Anesthesia Postprocedure Evaluation (Signed)
  Anesthesia Post-op Note  Patient: Jeff [name]  Procedure(s) Performed: Procedure(s): CATARACT EXTRACTION PHACO AND INTRAOCULAR LENS PLACEMENT (IOC) (Right)  Patient Location: PACU  Anesthesia Type:MAC  Level of Consciousness: awake, alert  and oriented  Airway and Oxygen Therapy: Patient Spontanous Breathing  Post-op Pain: none  Post-op Assessment: Post-op Vital signs reviewed, Patient's Cardiovascular Status Stable, Respiratory Function Stable, Patent Airway, No signs of Nausea or vomiting and Pain level controlled  Post-op Vital Signs: stable  Last Vitals:  Filed Vitals:   10/29/13 1253  BP: 145/95  Pulse: 75  Temp:   Resp: 15    Complications: No apparent anesthesia complications

## 2013-10-29 NOTE — H&P (View-Only) (Signed)
                  History & Physical:   DATE:   10-24-2013  NAME:  Jeff [name]     [id number]       HISTORY OF PRESENT ILLNESS: Chief Eye Complaints blurry vision . OD interferes with driving can't see to worrk. Patient states I did a series of test at Dr. Herdon's office. Patient c/o having the usual discomfort, OS feels irritated in the am. Patient c/o have twinge every now and then. Va is terrible per pt. Patient would like to discuss about cataract sx OS.    HPI: EYES: Reports symptoms of vision disturbances.        LOCATION: BOTH EYES        QUALITY/COURSE:   Reports condition is worsening.        INTENSITY/SEVERITY:    Reports measurement ( or degree) as severe .      DURATION:   Reports the general length of symptoms to be months.      ONSET/TIMING:   Reports occurrence as continuous.      CONTEXT/WHEN:   Reports usually associated with   MODIFIERS/TREATMENTS:  Improved by              ACTIVE PROBLEMS: Posterior subcapsular polar senile cataract   ICD10:   ICD9: 366.14  OD The vision has recently gotten worse OD .  patient  was seen at [facility] for a 2nd opinion and glaucoma consultant agreed that  patient 's visual loss is from cataract OD and suggest cataract surgery OD.  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   ICD10:   ICD9: 377.14  Onset: 09/05/2012 11:27  Initial Date:    Stable following phacoemulsification and revision of glaucoma surgery Left eye Pseudophakia   ICD10: Z96.1 ICD9: 446.0 Onset: 10/24/2013 13:02 Initial Date:    Headache (includes chronic daily) nonspecific or mixed ICD9: 784.0  Onset: 08/11/2013 13:35  Initial Date:   ICD10: R51  Peripheral visual field defect   ICD9: 368.44  Onset: 08/11/2013 13:35  Initial Date:   ICD10:    OD Glaucoma, severe stage   ICD9: 365.73  OS IOP too high  Onset: 09/05/2012 11:27  Initial Date:   ICD10:  SURGERIES:   phaco emulsion cataract extraction  & bleb 07-16-13 OS GLAUCOMA DEVICE WITH [facility] 08-21-12   SLT ou 360 x's 3 OD  Glaucoma Surgery Jan 2013 Glaucoma Surgery revision bleb Feb 2013 Finger Surgery Aug 2013  MEDICATIONS: Trazodone:   100 mg tablet  SIG-  1 each   once a day (at bedtime)   Amlodipine (Norvasc):   10 mg tablet  SIG-  1 each   once a day   Ciloxan (Ciprofloxacin) Solution: 0.3% solution SIG-  2 drop(s)  every 4 hours   OD  Prolensa: Strength-  SIG-   OD  Durezol: 0.05% emulsion SIG-  1 drop in affected eye 6 times a day  6 times a day  OS  Vicodin HP: 300 mg-10 mg tablet SIG-  1 tab(s) orally every 6 hours  REVIEW OF SYSTEMS: ROS:   GEN- Constitutional: Negative general-constitutional systems review.      HENT: GEN - Endocrine: Reports symptoms of LUNGS/Respiratory:  HEART/Cardiovascular: Reports symptoms of ABD/Gastrointestinal:   Musculoskeletal (BJE): NEURO/Neurological: PSYCH/Psychiatric:    Is the pt oriented to time, place, person? YES Mood   agitated __  TOBACCO: No exposure to tobacco.        Never smoker   ICD9: V13.89  Tobacco use:     Tobacco cessation:  SOCIAL HISTORY: Married.   UPS  FAMILY HISTORY: Family History - 1st Degree Relatives:  Mother dead.  Father is dead.    ALLERGIES: Alphagan P:  combigan  PHYSICAL EXAMINATION: VS: BMI: 24.7.  BP: 128/82.  H: 72.00 in.  P: 88 /min.  RR: 20 /min.  W: 182lbs 0oz.    Va OD [place] 20/60 fuzzy PH 20/NI OS [place] 20/200 blurry PH 20/NI   MR OD:-0.50  +1.00 X 046  NI OS:NI  [name]'s05/02/2013 12:33  OD:42.75 43.75 OS:43.25 44.00  VF:   OD   Full to confrontation testing                                                 OS   Decreased vision superior and inferior temporally    Motility FULL  PUPILS: round OU  EYELIDS & OCULAR ADNEXA normal    SLE: Conjunctiva  -seidel, elevated  bleb OD, 180 defuse bleb  w/ no leak    bleb elevated, - seidel,  +1  Injection loose suture noted OS   Cornea:normal OD  Decreased Tear Break-Up Time OD   anterior chamber :Deep and quiet OU tubes in  good position  Iris  normal    Lens  +1  nuclear  sclerosis& +2-3 psc OD,  posterior chamber intraocular lens implant in good position OS   Ta   in mmHg    OD ,14    OS  10  Time09/05/2013 13:44   Gonio OS tube  lumen open   Dilation   Fundus: \optic nerve   OD    pale color 85% cup rim intact, temporal pallor                                          OS pale 95% cup.  Thin rim   Macula       OD     clear                                                OS  clear  Vessels Normal  Periphery Normal   Visual Fields 08/05/2013 12:57  OD:  paracentral has one spot that's worst, nasal depression   Exam: GENERAL: Appearance: HEAD, EARS, NOSE AND THROAT: Ears-Nose (external) Inspection: Externally, nose and ears are normal in appearance and without scars, lesions, or nodules.      Hearing assessment shows no problems with normal conversation.      LUNGS and RESPIRATORY: Lung auscultation elicits no wheezing, rhonci, rales or rubs and with equal breath sounds.    Respiratory effort described as breathing is unlabored and chest movement is symmetrical.    HEART (Cardiovascular): Heart auscultation discovers regular rate and rhythm; no murmur, gallop or rub. Normal heart sounds.    ABDOMEN (Gastrointestinal): Mass/Tenderness Exam: Neither are present.     MUSCULOSKELETAL (BJE): Inspection-Palpation: No major bone, joint, tendon, or muscle changes.      NEUROLOGICAL: Alert and oriented. No major deficits of coordination or sensation.        PSYCHIATRIC: Insight and judgment appear  both to be intact and appropriate.    Mood and affect are described as normal mood and full affect.    SKIN: Skin Inspection: No rashes or lesions  ADMITTING DIAGNOSIS: Posterior subcapsular polar senile cataract   ICD10:   ICD9: 366.14  OD The vision has recently gotten worse OD .  patient  was seen at [facility] for a 2nd opinion and glaucoma consultant agreed that  patient 's visual loss is from  cataract OD and suggest cataract surgery OD.  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   ICD10:   ICD9: 377.14  Onset: 09/05/2012 11:27  Initial Date:    Stable following phacoemulsification and revision of glaucoma surgery Left eye  Pseudophakia   ICD10: Z96.1 ICD9: 446.0 Onset: 10/24/2013 13:02   Headache (includes chronic daily) nonspecific or mixed ICD9: 784.0  Onset: 08/11/2013 13:35   Peripheral visual field defect   ICD9: 368.44  Onset: 08/11/2013 13:35    OD Glaucoma, severe stage   ICD9: 365.73  OS IOP too high  Onset: 09/05/2012 11:27  SURGICAL TREATMENT PLAN: phaco emulsion cataract extraction  w  intraocular lens implant  OD   Risk and benefits of surgery have been reviewed with the patient and the patient agrees to proceed with the surgical procedure.    ___________________________ Jeff [name], Jr. Starter - Inactive Problems:  

## 2013-10-29 NOTE — Anesthesia Preprocedure Evaluation (Signed)
Anesthesia Evaluation  Patient identified by MRN, date of birth, ID band Patient awake    Reviewed: Allergy & Precautions, H&P , NPO status , Patient's Chart, lab work & pertinent test results  Airway Mallampati: II TM Distance: >3 FB Neck ROM: Full    Dental  (+) Teeth Intact   Pulmonary  breath sounds clear to auscultation        Cardiovascular hypertension, Rhythm:Regular Rate:Normal     Neuro/Psych    GI/Hepatic   Endo/Other    Renal/GU      Musculoskeletal   Abdominal   Peds  Hematology   Anesthesia Other Findings   Reproductive/Obstetrics                           Anesthesia Physical Anesthesia Plan  ASA: II  Anesthesia Plan: MAC   Post-op Pain Management:    Induction: Intravenous  Airway Management Planned: Natural Airway and Simple Face Mask  Additional Equipment:   Intra-op Plan:   Post-operative Plan:   Informed Consent: I have reviewed the patients History and Physical, chart, labs and discussed the procedure including the risks, benefits and alternatives for the proposed anesthesia with the patient or authorized representative who has indicated his/her understanding and acceptance.   Dental advisory given  Plan Discussed with: CRNA and Anesthesiologist  Anesthesia Plan Comments: (Hypertension BP 180/110 on Norvasc, BP normally well controlled Cataract OD  Plan MAC)        Anesthesia Quick Evaluation

## 2013-10-29 NOTE — Interval H&P Note (Signed)
History and Physical Interval Note:  10/29/2013 11:37 AM  Jeff [name]  has presented today for surgery, with the diagnosis of Cataract  The various methods of treatment have been discussed with the patient and family. After consideration of risks, benefits and other options for treatment, the patient has consented to  Procedure(s): CATARACT EXTRACTION PHACO AND INTRAOCULAR LENS PLACEMENT (IOC) (Right) as a surgical intervention .  The patient's history has been reviewed, patient examined, no change in status, stable for surgery.  I have reviewed the patient's chart and labs.  Questions were answered to the patient's satisfaction.     Henning Ehle

## 2013-10-29 NOTE — Transfer of Care (Signed)
Immediate Anesthesia Transfer of Care Note  Patient: Jeff [name]  Procedure(s) Performed: Procedure(s): CATARACT EXTRACTION PHACO AND INTRAOCULAR LENS PLACEMENT (IOC) (Right)  Patient Location: PACU  Anesthesia Type:MAC  Level of Consciousness: awake, alert , oriented and patient cooperative  Airway & Oxygen Therapy: Patient Spontanous Breathing  Post-op Assessment: Report given to PACU RN, Post -op Vital signs reviewed and stable and Patient moving all extremities  Post vital signs: Reviewed and stable  Complications: No apparent anesthesia complications

## 2013-10-29 NOTE — Op Note (Signed)
Preoperative diagnosis: Visually significant cataract right eye and glaucoma right eye Postoperative diagnosis: Same Procedure: Phacoemulsification with intraocular lens implant Complications: None Anesthesia: 2% Xylocaine with epinephrine in a 50-50 mixture of 0.75% Marcaine with ample [place] Assistant: MingLee Procedure: The patient was transported to the operating room where he was given a peribulbar block with the aforementioned local anesthetic agent. Following this the patient's face was prepped and draped in the usual sterile fashion. With the surgeon sitting temporally the operating microscope in position a Weck-Cel sponge was used to fixate the globe it was noted that there was a nice elevated superior nasal bleb with 180 of filtration and there was a express glaucoma device 2 in good position. With a Weck-Cel sponge a 15 blade was used to enter through superior temporal clear cornea Viscoat was injected into the anterior chamber. Using an additional Weck-Cel sponge a 2.75 mm keratome blade was used in a stepwise fashion to into the anterior chamber. A bent 25-gauge needle was used to incise anterior capsule and a continuous tear curvilinear capsulorrhexis was formed. The Utrata forceps were then used to remove the anterior capsule. BSS was used to hydrodissect and hydrodelineate the nucleus the nucleus was noted to elevate out of the capsular bag. The phacoemulsification unit was then used to sculpt the nucleus centrally the nucleus was rotated and divided in half the nucleus was then aspirated using the phacotip on to all nuclear particles were removed. Following this the I/A was then used to remove the epinucleus and strip cortical fibrous and the posterior capsule after all cortical fibers had been removed the posterior capsule was polished centrally. Provisc was injected in the eye and the intraocular lens implant was examined and noted to have no defects. The lens was an Alcon AcrySof IQ lens  SN 60 WF 20.5 diopter lens SN #[id number] the lens is placed in the lens injector was injected in the capsular bag rotated into position with a Kuglen hook the irrigation aspiration device was then used to remove viscoelastic from the eye Miochol was injected and the pupil was noted to come down symmetrically round. The eye was pressurized and a single 10-0 nylon suture was placed to close the incision the eye was again pressurized and there was no leakage. All instruments removed from the eye and topical TobraDex ointment was applied to the eye a patch and Fox shield were placed and the patient returned to recovery area in stable condition. Chalmers Guest Junior M.D.

## 2013-10-30 ENCOUNTER — Encounter (HOSPITAL_COMMUNITY): Payer: Self-pay | Admitting: Ophthalmology

## 2013-10-31 ENCOUNTER — Encounter: Payer: BC Managed Care – PPO | Admitting: Internal Medicine

## 2013-12-15 ENCOUNTER — Other Ambulatory Visit: Payer: Self-pay | Admitting: Internal Medicine

## 2013-12-16 NOTE — Telephone Encounter (Signed)
Last OV 9.26.14, last refill 8.9.15.  please advise refill

## 2014-01-09 ENCOUNTER — Ambulatory Visit: Payer: BC Managed Care – PPO | Admitting: Adult Health

## 2014-01-24 ENCOUNTER — Other Ambulatory Visit: Payer: Self-pay | Admitting: Internal Medicine

## 2014-01-29 ENCOUNTER — Other Ambulatory Visit: Payer: Self-pay | Admitting: *Deleted

## 2014-01-29 MED ORDER — ALLOPURINOL 300 MG PO TABS: 150.0000 mg | ORAL_TABLET | Freq: Every day | ORAL | Status: DC

## 2014-03-08 ENCOUNTER — Other Ambulatory Visit: Payer: Self-pay | Admitting: Internal Medicine

## 2014-04-01 ENCOUNTER — Other Ambulatory Visit: Payer: Self-pay | Admitting: *Deleted

## 2014-04-01 MED ORDER — ALLOPURINOL 300 MG PO TABS
150.0000 mg | ORAL_TABLET | Freq: Every day | ORAL | Status: DC
Start: 2014-04-01 — End: 2015-05-17

## 2014-04-17 ENCOUNTER — Ambulatory Visit: Payer: BC Managed Care – PPO | Admitting: Adult Health

## 2014-04-24 ENCOUNTER — Other Ambulatory Visit (INDEPENDENT_AMBULATORY_CARE_PROVIDER_SITE_OTHER): Payer: BLUE CROSS/BLUE SHIELD

## 2014-04-24 ENCOUNTER — Telehealth: Payer: Self-pay | Admitting: *Deleted

## 2014-04-24 ENCOUNTER — Other Ambulatory Visit: Payer: Self-pay | Admitting: *Deleted

## 2014-04-24 DIAGNOSIS — E785 Hyperlipidemia, unspecified: Secondary | ICD-10-CM

## 2014-04-24 DIAGNOSIS — M109 Gout, unspecified: Secondary | ICD-10-CM

## 2014-04-24 DIAGNOSIS — Z125 Encounter for screening for malignant neoplasm of prostate: Secondary | ICD-10-CM

## 2014-04-24 DIAGNOSIS — I10 Essential (primary) hypertension: Secondary | ICD-10-CM

## 2014-04-24 LAB — COMPREHENSIVE METABOLIC PANEL
ALT: 66 U/L — AB (ref 0–53)
AST: 54 U/L — ABNORMAL HIGH (ref 0–37)
Albumin: 4.1 g/dL (ref 3.5–5.2)
Alkaline Phosphatase: 69 U/L (ref 39–117)
BILIRUBIN TOTAL: 0.3 mg/dL (ref 0.2–1.2)
BUN: 20 mg/dL (ref 6–23)
CO2: 30 mEq/L (ref 19–32)
CREATININE: 0.97 mg/dL (ref 0.40–1.50)
Calcium: 9.1 mg/dL (ref 8.4–10.5)
Chloride: 103 mEq/L (ref 96–112)
GFR: 86.36 mL/min (ref >60.00)
Glucose, Bld: 114 mg/dL — ABNORMAL HIGH (ref 70–99)
Potassium: 4.9 mEq/L (ref 3.5–5.1)
SODIUM: 135 meq/L (ref 135–145)
Total Protein: 6.9 g/dL (ref 6.0–8.3)

## 2014-04-24 LAB — LIPID PANEL
CHOLESTEROL: 275 mg/dL — AB (ref 0–200)
HDL: 77.6 mg/dL (ref >39.00)
LDL Cholesterol: 161 mg/dL — ABNORMAL HIGH (ref 0–99)
NonHDL: 197.4
TRIGLYCERIDES: 180 mg/dL — AB (ref 0.0–149.0)
Total CHOL/HDL Ratio: 4
VLDL: 36 mg/dL (ref 0.0–40.0)

## 2014-04-24 LAB — URIC ACID: URIC ACID, SERUM: 6.2 mg/dL (ref 4.0–7.8)

## 2014-04-24 LAB — PSA: PSA: 1.66 ng/mL (ref 0.10–4.00)

## 2014-04-24 NOTE — Telephone Encounter (Signed)
What labs and dx?  

## 2014-04-26 ENCOUNTER — Encounter: Payer: Self-pay | Admitting: Internal Medicine

## 2014-05-08 ENCOUNTER — Other Ambulatory Visit: Payer: Self-pay | Admitting: Internal Medicine

## 2014-05-08 NOTE — Telephone Encounter (Signed)
Last OV 9.11.15.  Please advise refill 

## 2014-05-09 NOTE — Telephone Encounter (Signed)
90 day supply authorized and sent   

## 2014-05-14 ENCOUNTER — Telehealth: Payer: Self-pay | Admitting: Internal Medicine

## 2014-05-14 ENCOUNTER — Ambulatory Visit (INDEPENDENT_AMBULATORY_CARE_PROVIDER_SITE_OTHER): Payer: BLUE CROSS/BLUE SHIELD | Admitting: Internal Medicine

## 2014-05-14 ENCOUNTER — Encounter: Payer: Self-pay | Admitting: Internal Medicine

## 2014-05-14 VITALS — BP 138/78 | HR 74 | Temp 98.3°F | Resp 16 | Ht 70.5 in | Wt 194.5 lb

## 2014-05-14 DIAGNOSIS — R739 Hyperglycemia, unspecified: Secondary | ICD-10-CM | POA: Diagnosis not present

## 2014-05-14 DIAGNOSIS — I10 Essential (primary) hypertension: Secondary | ICD-10-CM | POA: Diagnosis not present

## 2014-05-14 DIAGNOSIS — R748 Abnormal levels of other serum enzymes: Secondary | ICD-10-CM

## 2014-05-14 LAB — HEPATIC FUNCTION PANEL
ALK PHOS: 64 U/L (ref 39–117)
ALT: 63 U/L — ABNORMAL HIGH (ref 0–53)
AST: 54 U/L — ABNORMAL HIGH (ref 0–37)
Albumin: 3.9 g/dL (ref 3.5–5.2)
BILIRUBIN DIRECT: 0.1 mg/dL (ref 0.0–0.3)
BILIRUBIN TOTAL: 0.6 mg/dL (ref 0.2–1.2)
Total Protein: 6.9 g/dL (ref 6.0–8.3)

## 2014-05-14 LAB — IBC PANEL
IRON: 114 ug/dL (ref 42–165)
SATURATION RATIOS: 23.6 % (ref 20.0–50.0)
TRANSFERRIN: 345 mg/dL (ref 212.0–360.0)

## 2014-05-14 LAB — MICROALBUMIN / CREATININE URINE RATIO
CREATININE, U: 131.4 mg/dL
MICROALB UR: 1.3 mg/dL (ref 0.0–1.9)
Microalb Creat Ratio: 1 mg/g (ref 0.0–30.0)

## 2014-05-14 LAB — HEMOGLOBIN A1C: Hgb A1c MFr Bld: 5.6 % (ref 4.6–6.5)

## 2014-05-14 MED ORDER — AMLODIPINE BESYLATE 10 MG PO TABS: 10.0000 mg | ORAL_TABLET | Freq: Every day | ORAL | Status: DC

## 2014-05-14 NOTE — Telephone Encounter (Signed)
emmi emailed °

## 2014-05-14 NOTE — Progress Notes (Signed)
Pre-visit discussion using our clinic review tool. No additional management support is needed unless otherwise documented below in the visit note.  

## 2014-05-14 NOTE — Progress Notes (Signed)
Patient ID: Jeff [name], male   DOB: 12/21/1960, 54 y.o.   MRN: [id number]  Patient Active Problem List   Diagnosis Date Noted  . Elevated liver enzymes 05/17/2014  . Hyperglycemia 05/17/2014  . Chronic glaucoma 10/21/2013  . Headache(784.0) 09/12/2013  . Acute upper respiratory infections of unspecified site 11/17/2012  . Insomnia 11/17/2012  . Erectile dysfunction of nonorganic origin 09/04/2012  . Hx of acute gouty arthritis 09/03/2012  . Routine general medical examination at a health care facility 09/03/2012  . Screening for STD (sexually transmitted disease) 09/03/2012  . Screening for prostate cancer 09/03/2012  . Urinary hesitancy 07/28/2012  . Hoarseness of voice 07/28/2012  . Polyarthritis 07/28/2012  . Glaucoma (increased eye pressure) 03/22/2011  . HTN (hypertension) 03/22/2011    Subjective:  CC:   Chief Complaint  Patient presents with  . Follow-up    lab work    HPI:   Jeff [name] is a 54 y.o. male who presents for  Follow up on abnormal labs: patient was noted to have elevated liver enzymes and elevated fasting glucose,  Diet and alcohol use reviewed.  His diet is not low glycemic an dh has 3 to 4 alcoholic beverages per night.  He is exercising semi regularly.  Denies symptoms of diabetes.  Weight has increaesed  Wt Readings from Last 3 Encounters:  10/29/13 188 lb 5 oz (85.418 kg)  09/12/13 190 lb (86.183 kg)  07/16/13 181 lb (82.101 kg)       Past Medical History  Diagnosis Date  . Glaucoma (increased eye pressure)   . Headache(784.0)     07/11/13- none in a long time  . GERD (gastroesophageal reflux disease)     with certain foods  . Arthritis   . Hypertension     no meds  . Claustrophobia     Past Surgical History  Procedure Laterality Date  . No past surgeries    . Mini shunt insertion  03/09/2011    Procedure: INSERTION OF MINI SHUNT;  Surgeon: Chalmers Guest, MD;  Location: [facility] OR;  Service: Ophthalmology;  Laterality: Left;  .  Finger surgery Right   . Eye surgery      2 on left eye  . Cataract extraction w/phaco Left 07/16/2013    Procedure: CATARACT EXTRACTION PHACO AND INTRAOCULAR LENS PLACEMENT (IOC) LEFT EYE;  Surgeon: Chalmers Guest, MD;  Location: [facility] OR;  Service: Ophthalmology;  Laterality: Left;  . Cataract extraction w/phaco Right 10/29/2013    Procedure: CATARACT EXTRACTION PHACO AND INTRAOCULAR LENS PLACEMENT (IOC);  Surgeon: Chalmers Guest, MD;  Location: [facility] OR;  Service: Ophthalmology;  Laterality: Right;       The following portions of the patient's history were reviewed and updated as appropriate: Allergies, current medications, and problem list.    Review of Systems:   Patient denies headache, fevers, malaise, unintentional weight loss, skin rash, eye pain, sinus congestion and sinus pain, sore throat, dysphagia,  hemoptysis , cough, dyspnea, wheezing, chest pain, palpitations, orthopnea, edema, abdominal pain, nausea, melena, diarrhea, constipation, flank pain, dysuria, hematuria, urinary  Frequency, nocturia, numbness, tingling, seizures,  Focal weakness, Loss of consciousness,  Tremor, insomnia, depression, anxiety, and suicidal ideation.     History   Social History  . Marital Status: Married    Spouse Name: N/A  . Number of Children: 0  . Years of Education: college3   Occupational History  . [employer]   Social History Main Topics  . Smoking status: Never Smoker   .  Smokeless tobacco: Never Used  . Alcohol Use: 3.6 - 4.8 oz/week    6-8 Cans of beer per week     Comment: daily  . Drug Use: No  . Sexual Activity: Not on file   Other Topics Concern  . Not on file   Social History Narrative    Objective:  Filed Vitals:   05/14/14 1002  BP: 138/78  Pulse: 74  Temp: 98.3 F (36.8 C)  Resp: 16     General appearance: alert, cooperative and appears stated age Ears: normal TM's and external ear canals both ears Throat: lips, mucosa, and tongue normal; teeth and gums  normal Neck: no adenopathy, no carotid bruit, supple, symmetrical, trachea midline and thyroid not enlarged, symmetric, no tenderness/mass/nodules Back: symmetric, no curvature. ROM normal. No CVA tenderness. Lungs: clear to auscultation bilaterally Heart: regular rate and rhythm, S1, S2 normal, no murmur, click, rub or gallop Abdomen: soft, non-tender; bowel sounds normal; no masses,  no organomegaly Pulses: 2+ and symmetric Skin: Skin color, texture, turgor normal. No rashes or lesions Lymph nodes: Cervical, supraclavicular, and axillary nodes normal.  Assessment and Plan:  Elevated liver enzymes discusssed potential  etologies.  Advised to reduce his alcohol consumption,follow a low glycemic index diet and have additional labs to rule out autoimmune causes.    Hyperglycemia I have addressed  His fasting sugars and recommended a low glycemic index diet utilizing smaller more frequent meals to increase metabolism.  I have also recommended that patient start exercising with a goal of 30 minutes of aerobic exercise a minimum of 5 days per week. Screening for lipid disorders, and diabetes to be done today.  Lab Results  Component Value Date   HGBA1C 5.6 05/14/2014   Lab Results  Component Value Date   CHOL 275* 04/24/2014   HDL 77.60 04/24/2014   LDLCALC 161* 04/24/2014   LDLDIRECT 144.0 08/06/2012   TRIG 180.0* 04/24/2014   CHOLHDL 4 04/24/2014        A total of 25 minutes of face to face time was spent with patient more than half of which was spent in counselling on the above mentioned issues.  Updated Medication List Outpatient Encounter Prescriptions as of 05/14/2014  Medication Sig  . allopurinol (ZYLOPRIM) 300 MG tablet Take 0.5 tablets (150 mg total) by mouth daily.  Marland Kitchen amLODipine (NORVASC) 10 MG tablet Take 1 tablet (10 mg total) by mouth daily.  . colchicine 0.6 MG tablet Take 0.6 mg by mouth daily.  . COSOPT PF 22.3-6.8 MG/ML SOLN Apply 2 drops topically at bedtime.  To the right Eye only  . ibuprofen (ADVIL,MOTRIN) 600 MG tablet TAKE 1 TABLET BY MOUTH 3 TIMES A DAY AS NEEDED FOR GOUT FLARE  . loperamide (IMODIUM A-D) 2 MG tablet Take 2 mg by mouth daily as needed for diarrhea or loose stools.  . [DISCONTINUED] amLODipine (NORVASC) 10 MG tablet TAKE 1 TABLET BY MOUTH EVERY DAY  . bismuth subsalicylate (PEPTO BISMOL) 262 MG/15ML suspension Take 15 mLs by mouth every 6 (six) hours as needed for indigestion or diarrhea or loose stools.  . DUREZOL 0.05 % EMUL Place 4-6 drops into the left eye daily.   . nortriptyline (PAMELOR) 10 MG capsule Take 10 mg by mouth at bedtime as needed for sleep.  . traZODone (DESYREL) 50 MG tablet Take 50 mg by mouth daily as needed for sleep.     Orders Placed This Encounter  Procedures  . Hepatic function panel  . Hepatitis, Acute  .  IBC panel  . Hemoglobin A1c  . ANA  . Anti-Smith antibody  . Microalbumin / creatinine urine ratio    No Follow-up on file.

## 2014-05-14 NOTE — Patient Instructions (Signed)
Your last fasting glucose indicates you are at risk for developing diabetes, so I am checking an A1c today   I want you to lose 25 lbs over the next six months with a low glycemic index diet and regular exercise (30 minutes of cardio 5 days per week is your goal)  This is  my version of a  "Low GI"  Diet:  It will still lower your blood sugars and allow you to lose 4 to 8  lbs  per month if you follow it carefully.  Your goal with exercise is a minimum of 30 minutes of aerobic exercise 5 days per week (Walking does not count once it becomes easy!)     All of the foods can be found at grocery stores and in bulk at [employer].  The Atkins protein bars and shakes are available in more varieties at Target, WalMart and [employer]'s [employer].     7 AM Breakfast:  Choose from the following:  Low carbohydrate Protein  Shakes (I recommend the EAS AdvantEdge "Carb Control" shakes  Or the low carb shakes by Atkins.    2.5 carbs   Arnold's "Sandwhich Thin"toasted  w/ peanut butter (no jelly: about 20 net carbs  "Bagel Thin" with cream cheese and salmon: about 20 carbs   a scrambled egg/bacon/cheese burrito made with Mission's "carb balance" whole wheat tortilla  (about 10 net carbs )  A slice of home made fritatta (egg based dish without a crust:  google it)    Avoid cereal and bananas, oatmeal and cream of wheat and grits. They are loaded with carbohydrates!   10 AM: high protein snack  Protein bar by Atkins (the snack size, under 200 cal, usually < 6 net carbs).    A stick of cheese:  Around 1 carb,  100 cal     Dannon Light n Fit [place]Greek Yogurt  (80 cal, 8 carbs)  Other so called "protein bars" and Greek yogurts tend to be loaded with carbohydrates.  Remember, in food advertising, the word "energy" is synonymous for " carbohydrate."  Lunch:   A Sandwich using the bread choices listed, Can use any  Eggs,  lunchmeat, grilled meat or canned tuna), avocado, regular mayo/mustard  and cheese.  A Salad using blue  cheese, ranch,  Goddess or vinagrette,  No croutons or "confetti" and no "candied nuts" but regular nuts OK.   No pretzels or chips.  Pickles and miniature sweet peppers are a good low carb alternative that provide a "crunch"  The bread is the only source of carbohydrate in a sandwich and  can be decreased by trying some of these alternatives to traditional loaf bread:  Joseph's makes a pita bread and a flat bread (called "Lavash") that are 50 cal and 4 net carbs available at [employer]Js, [employer] and Wal[employer].  This can be toasted to use with hummous as well  Toufayan makes a low carb flatbread that's 100 cal and 9 net carbs available at [employer] and [employer]  Mission makes 2 sizes of  Low carb whole wheat tortilla  (The large one is 210 cal and 6 net carbs)  Flat Out makes flatbreads that are low carb as well  aa folded flatbread called "Foldit" available at [employer]   Avoid "Low fat dressings, as well as Reyne DumasCatalina and [address] dressings They are loaded with sugar.  Ranch,  Baxter InternationalBlue cheese, Goddess,  vinagrettes are all fine   3 PM/ Mid day  Snack:  Consider  1 ounce of  almonds, walnuts, pistachios, pecans, peanuts,  Macadamia nuts or a nut medley.  Avoid "granola"; the dried cranberries and raisins are loaded with carbohydrates. Mixed nuts as long as there are no raisins,  cranberries or dried fruit.    Try the prosciutto/mozzarella cheese sticks by Fiorruci  In deli /backery section   High protein   To avoid overindulging in snacks: Try drinking a glass of almond coconut milk light by Silk ( Or a cup of coffee with your Atkins chocolate bar to keep you from having 3!!!   Pork rinds!  Yes Pork Rinds        6 PM  Dinner:     Meat/fowl/fish with a green salad, and either broccoli, cauliflower, green beans, spinach, brussel sprouts or  Lima beans. DO NOT BREAD THE PROTEIN!!      There is a low carb pasta by Dreamfield's that is acceptable and tastes great: only 5 digestible carbs/serving.(  All grocery stores but BJs carry it )  There are frozen dinners that are low carb:  Try Michel Angelo's chicken piccata or chicken or eggplant parm over low carb pasta.(Lowes and BJs)   Clifton Custard Sanchez's "Carnitas" (pulled pork, no sauce,  0 carbs) or his beef pot roast to make a dinner burrito (at BJ's)  Pesto over low carb pasta (bj's sells a good quality pesto in the center refrigerated section of the deli   Try satueeing  Roosvelt Harps with mushroooms  Whole wheat pasta is still full of digestible carbs and  Not as low in glycemic index as Dreamfield's.   Brown rice is still rice,  So skip the rice and noodles if you eat [place] or [place] (or at least limit to 1/2 cup)  9 PM snack :   Breyer's "low carb" fudgsicle or  ice cream bar (Carb Smart line), or  Weight Watcher's ice cream bar , or another "no sugar added" ice cream;  a serving of fresh berries/cherries with whipped cream   Cheese or DANNON'S LlGHT N FIT GREEK YOGURT or the [place] greek yogurt   8 ounces of [place] unsweetened almond/cococunut milk  Cheese and crackers (using WASA crackers,  They are low carb) or peanut butter on low carb crackers or pita bread     Avoid bananas, pineapple, grapes  and watermelon on a regular basis because they are high in sugar.  THINK OF THEM AS DESSERT  Remember that snack Substitutions should be less than 10 NET carbs per serving and meals should be < 25 net carbs. Remember that carbohydrates from fiber do not affect blood sugar, so you can  subtract fiber grams to get the "net carbs " of any particular food item.

## 2014-05-15 LAB — ANA: Anti Nuclear Antibody(ANA): NEGATIVE

## 2014-05-15 LAB — HEPATITIS PANEL, ACUTE
HCV Ab: NEGATIVE
Hep A IgM: NONREACTIVE
Hep B C IgM: NONREACTIVE
Hepatitis B Surface Ag: NEGATIVE

## 2014-05-17 DIAGNOSIS — K760 Fatty (change of) liver, not elsewhere classified: Secondary | ICD-10-CM | POA: Insufficient documentation

## 2014-05-17 DIAGNOSIS — R7301 Impaired fasting glucose: Secondary | ICD-10-CM | POA: Insufficient documentation

## 2014-05-17 NOTE — Assessment & Plan Note (Signed)
discusssed potential  etologies.  Advised to reduce his alcohol consumption,follow a low glycemic index diet and have additional labs to rule out autoimmune causes.

## 2014-05-17 NOTE — Assessment & Plan Note (Signed)
I have addressed  His fasting sugars and recommended a low glycemic index diet utilizing smaller more frequent meals to increase metabolism.  I have also recommended that patient start exercising with a goal of 30 minutes of aerobic exercise a minimum of 5 days per week. Screening for lipid disorders, and diabetes to be done today.  Lab Results  Component Value Date   HGBA1C 5.6 05/14/2014   Lab Results  Component Value Date   CHOL 275* 04/24/2014   HDL 77.60 04/24/2014   LDLCALC 161* 04/24/2014   LDLDIRECT 144.0 08/06/2012   TRIG 180.0* 04/24/2014   CHOLHDL 4 04/24/2014

## 2014-05-21 LAB — ANTI-SMITH ANTIBODY

## 2014-05-22 ENCOUNTER — Encounter: Payer: Self-pay | Admitting: Internal Medicine

## 2014-05-22 ENCOUNTER — Ambulatory Visit (INDEPENDENT_AMBULATORY_CARE_PROVIDER_SITE_OTHER): Payer: BLUE CROSS/BLUE SHIELD | Admitting: Internal Medicine

## 2014-05-22 VITALS — BP 140/92 | HR 81 | Temp 98.2°F | Resp 16 | Ht 70.5 in | Wt 194.8 lb

## 2014-05-22 DIAGNOSIS — R419 Unspecified symptoms and signs involving cognitive functions and awareness: Secondary | ICD-10-CM | POA: Diagnosis not present

## 2014-05-22 DIAGNOSIS — Z0000 Encounter for general adult medical examination without abnormal findings: Secondary | ICD-10-CM | POA: Diagnosis not present

## 2014-05-22 DIAGNOSIS — K219 Gastro-esophageal reflux disease without esophagitis: Secondary | ICD-10-CM

## 2014-05-22 DIAGNOSIS — I10 Essential (primary) hypertension: Secondary | ICD-10-CM

## 2014-05-22 DIAGNOSIS — M542 Cervicalgia: Secondary | ICD-10-CM

## 2014-05-22 DIAGNOSIS — R739 Hyperglycemia, unspecified: Secondary | ICD-10-CM

## 2014-05-22 DIAGNOSIS — R519 Headache, unspecified: Secondary | ICD-10-CM

## 2014-05-22 DIAGNOSIS — R1310 Dysphagia, unspecified: Secondary | ICD-10-CM

## 2014-05-22 DIAGNOSIS — R51 Headache: Secondary | ICD-10-CM

## 2014-05-22 DIAGNOSIS — R0683 Snoring: Secondary | ICD-10-CM

## 2014-05-22 DIAGNOSIS — Z1211 Encounter for screening for malignant neoplasm of colon: Secondary | ICD-10-CM | POA: Diagnosis not present

## 2014-05-22 MED ORDER — TIZANIDINE HCL 4 MG PO TABS: 4.0000 mg | ORAL_TABLET | Freq: Every day | ORAL | Status: DC

## 2014-05-22 MED ORDER — ALBUTEROL SULFATE HFA 108 (90 BASE) MCG/ACT IN AERS: 2.0000 | INHALATION_SPRAY | Freq: Four times a day (QID) | RESPIRATORY_TRACT | Status: DC | PRN

## 2014-05-22 MED ORDER — TRAMADOL HCL 50 MG PO TABS: 50.0000 mg | ORAL_TABLET | Freq: Four times a day (QID) | ORAL | Status: DC | PRN

## 2014-05-22 MED ORDER — LOSARTAN POTASSIUM 100 MG PO TABS: 100.0000 mg | ORAL_TABLET | Freq: Every day | ORAL | Status: DC

## 2014-05-22 NOTE — Patient Instructions (Addendum)
MRI of cervical spine  Sleep  Study  to rule out sleep apnea    Albuterol inhaler if needed for wheezing  GI referral to be set up   Changing your BP medication from amlodipine to losartan ,  Still once daily  STOP THE IBUPROFEN AND ALEVEL  Use tramadol and tylenol as needed for pain .  One week follow up for BP check

## 2014-05-22 NOTE — Progress Notes (Signed)
Patient ID: Jeff [name], male   DOB: 10-18-1960, 55 y.o.   MRN: [id number]    Patient ID: Jeff [name], male   DOB: 01-Jul-1960, 54 y.o.   MRN: [id number]   .[name] here for his annual physical examination and management of other chronic and acute problems. patient has been lost to follow up and has mutiple new issues to discuss, despite being seen last week to discuss abnormal blood tests. Marland Kitchen   1) He has a persistent recurrent  headache that starts in his posterior neck and involves the back of his head.  Wakes up with hands feeling tinglling,  Not with driving  Some times the pain radiates to  Both shoulders Was treated for tension headache by Dr Jannifer Franklin, with nortriptyline , but states that it made him more anxious so he stopped using it after about 3 weeks of daily use. Using ibuprofen prn .   2) He has been having trouble concentrating on work and understanding new material.  Feels that even when he is trying to focus that he is nNot fully engaged.  Having difficulty  remembering names, details.   3) Snores,  Wakes up tired    No prior testing for OSA   4) GERD and chronic hoarseness.  He was referred for diagnostic EGD/ and screening colonoscopy  In 2014 but the procedures were never done  Needs to be rescheduled .  Chronic hoarseness  For several years       The risk factors are reflected in the social history.  The roster of all physicians providing medical care to patient - is listed in the Snapshot section of the chart.  Activities of daily living:  [name] 100% independent in all ADLs: dressing, toileting, feeding as well as independent mobility  Home safety : The patient has smoke detectors in the home. He wears seatbelts.  There are no firearms at home. There is no violence in the home.   There is no risks for hepatitis, STDs or HIV. There is no   history of blood transfusion. There is no travel history to infectious disease endemic areas of the world.  The  patient has seen their dentist in the last six month and  their eye doctor in the last year.  They do not  have excessive sun exposure. They have seen a dermatoloigist in the last year. Discussed the need for sun protection: hats, long sleeves and use of sunscreen if there is significant sun exposure.   Diet: the importance of a healthy diet is discussed. They do have a healthy diet.  The benefits of regular aerobic exercise were discussed. He exercises a minimum of 30 minutes  5 days per week. Depression screen: there are no signs or vegative symptoms of depression- irritability, change in appetite, anhedonia, sadness/tearfullness.  The following portions of the patient's history were reviewed and updated as appropriate: allergies, current medications, past family history, past medical history,  past surgical history, past social history  and problem list.  Visual acuity was not assessed per patient preference since he has regular follow up with his ophthalmologist. Hearing and body mass index were assessed and reviewed.   During the course of the visit the patient was educated and counseled about appropriate screening and preventive services including :  nutrition counseling, colorectal cancer screening, and recommended immunizations.    Review of Systems:  Patient denies headache, fevers, malaise, unintentional weight loss, skin rash, eye pain, sinus congestion and sinus  pain, sore throat, dysphagia,  hemoptysis , cough, dyspnea, wheezing, chest pain, palpitations, orthopnea, edema, abdominal pain, nausea, melena, diarrhea, constipation, flank pain, dysuria, hematuria, urinary  Frequency, nocturia, numbness, tingling, seizures,  Focal weakness, Loss of consciousness,  Tremor, insomnia, depression, anxiety, and suicidal ideation.    Objective:  BP 140/92 mmHg  Pulse 81  Temp(Src) 98.2 F (36.8 C) (Oral)  Resp 16  Ht 5' 10.5" (1.791 m)  Wt 194 lb 12 oz (88.338 kg)  BMI 27.54 kg/m2  SpO2  99%  General appearance: alert, cooperative and appears stated age Ears: normal TM's and external ear canals both ears Throat: lips, mucosa, and tongue normal; teeth and gums normal Neck: very thick neck, no adenopathy, no carotid bruit, supple, symmetrical, trachea midline and thyroid not enlarged, symmetric, no tenderness/mass/nodules Back: symmetric, no curvature. ROM normal. No CVA tenderness. Lungs: clear to auscultation bilaterally Heart: regular rate and rhythm, S1, S2 normal, no murmur, click, rub or gallop Abdomen: soft, non-tender; bowel sounds normal; no masses,  no organomegaly Pulses: 2+ and symmetric Skin: Skin color, texture, turgor normal. No rashes or lesions Lymph nodes: Cervical, supraclavicular, and axillary nodes normal. Neuro: CNs 2-12 intact. DTRs 2+/4 in biceps, brachioradialis, patellars and achilles. Muscle strength 5/5 in upper and lower exremities. Fine resting tremor bilaterally both hands cerebellar function normal. Romberg negative.  No pronator drift.   Gait normal. Psych: affect normal, makes good eye contact. No fidgeting,  Smiles easily.  Denies suicidal thoughts  .  Assessment and Plan:   Problem List Items Addressed This Visit      Unprioritized   Recurrent occipital headache (Chronic)    screenign for cervical spine disease and OSa      Relevant Medications   traMADol (ULTRAM) tablet 50 mg   tiZANidine (ZANAFLEX) tablet   HTN (hypertension)    Improved on current regimen but still  not at goal and the amlodipine has caused bilateral lowe extremity edema. Change to losartan,  Screen for secondary causes including OSA  BP 140/92 mmHg  Pulse 81  Temp(Src) 98.2 F (36.8 C) (Oral)  Resp 16  Ht 5' 10.5" (1.791 m)  Wt 194 lb 12 oz (88.338 kg)  BMI 27.54 kg/m2  SpO2 99%         Relevant Medications   losartan (COZAAR) tablet   Other Relevant Orders   Ambulatory referral to Sleep Studies   Comp Met (CMET)   Routine general medical  examination at a health care facility    Annual wellness  exam was done as well as a comprehensive physical exam and management of acute and chronic conditions .  During the course of the visit the patient was educated and counseled about appropriate screening and preventive services including :  diabetes screening, lipid analysis with projected  10 year  risk for CAD , nutrition counseling, colorectal cancer screening, and recommended immunizations.  Printed recommendations for health maintenance screenings was given.       Hyperglycemia    I have addressed  His fasting sugars and recommended a low glycemic index diet utilizing smaller more frequent meals to increase metabolism.  I have also recommended that patient start exercising with a goal of 30 minutes of aerobic exercise a minimum of 5 days per week. Screening for lipid disorders, and diabetes to be done today.  Lab Results  Component Value Date   HGBA1C 5.6 05/14/2014   Lab Results  Component Value Date   CHOL 275* 04/24/2014   HDL 77.60 04/24/2014  LDLCALC 161* 04/24/2014   LDLDIRECT 144.0 08/06/2012   TRIG 180.0* 04/24/2014   CHOLHDL 4 04/24/2014             Cognitive complaints    Prior MRi reviewd,  Screen for metabolic issues and untreated OSA given concurrent  headaches and snoring. If all are normal will refer for cognitive testing.      Relevant Orders   Ambulatory referral to Sleep Studies   B12   RPR   Cervicalgia of occipito-atlanto-axial region    Other Visit Diagnoses    Dysphagia    -  Primary    Relevant Orders    Ambulatory referral to Gastroenterology    Gastroesophageal reflux disease without esophagitis        Relevant Orders    Ambulatory referral to Gastroenterology    Screening for colon cancer        Relevant Orders    Ambulatory referral to Gastroenterology    Cervicalgia        Relevant Orders    MR Cervical Spine Wo Contrast    Snoring        Relevant Orders    Ambulatory  referral to Sleep Studies

## 2014-05-22 NOTE — Progress Notes (Signed)
Pre-visit discussion using our clinic review tool. No additional management support is needed unless otherwise documented below in the visit note.  

## 2014-05-24 ENCOUNTER — Encounter: Payer: Self-pay | Admitting: Internal Medicine

## 2014-05-24 DIAGNOSIS — R419 Unspecified symptoms and signs involving cognitive functions and awareness: Secondary | ICD-10-CM | POA: Insufficient documentation

## 2014-05-24 DIAGNOSIS — M542 Cervicalgia: Secondary | ICD-10-CM | POA: Insufficient documentation

## 2014-05-24 NOTE — Assessment & Plan Note (Signed)

## 2014-05-24 NOTE — Assessment & Plan Note (Signed)
I have addressed  His fasting sugars and recommended a low glycemic index diet utilizing smaller more frequent meals to increase metabolism.  I have also recommended that patient start exercising with a goal of 30 minutes of aerobic exercise a minimum of 5 days per week. Screening for lipid disorders, and diabetes to be done today.  Lab Results  Component Value Date   HGBA1C 5.6 05/14/2014   Lab Results  Component Value Date   CHOL 275* 04/24/2014   HDL 77.60 04/24/2014   LDLCALC 161* 04/24/2014   LDLDIRECT 144.0 08/06/2012   TRIG 180.0* 04/24/2014   CHOLHDL 4 04/24/2014      

## 2014-05-24 NOTE — Assessment & Plan Note (Addendum)
Improved on current regimen but still  not at goal and the amlodipine has caused bilateral lowe extremity edema. Change to losartan,  Screen for secondary causes including OSA  BP 140/92 mmHg  Pulse 81  Temp(Src) 98.2 F (36.8 C) (Oral)  Resp 16  Ht 5' 10.5" (1.791 m)  Wt 194 lb 12 oz (88.338 kg)  BMI 27.54 kg/m2  SpO2 99%

## 2014-05-24 NOTE — Assessment & Plan Note (Signed)
screenign for cervical spine disease and OSa

## 2014-05-24 NOTE — Assessment & Plan Note (Signed)
Prior MRi reviewd,  Screen for metabolic issues and untreated OSA given concurrent  headaches and snoring.

## 2014-05-25 LAB — OTHER SOLSTAS TEST: Miscellaneous Test: 50085

## 2014-05-28 ENCOUNTER — Other Ambulatory Visit (INDEPENDENT_AMBULATORY_CARE_PROVIDER_SITE_OTHER): Payer: BLUE CROSS/BLUE SHIELD

## 2014-05-28 ENCOUNTER — Ambulatory Visit: Payer: BLUE CROSS/BLUE SHIELD

## 2014-05-28 ENCOUNTER — Encounter: Payer: Self-pay | Admitting: Internal Medicine

## 2014-05-28 VITALS — BP 130/92 | HR 89

## 2014-05-28 DIAGNOSIS — R419 Unspecified symptoms and signs involving cognitive functions and awareness: Secondary | ICD-10-CM

## 2014-05-28 DIAGNOSIS — I10 Essential (primary) hypertension: Secondary | ICD-10-CM | POA: Diagnosis not present

## 2014-05-28 LAB — COMPREHENSIVE METABOLIC PANEL
ALK PHOS: 72 U/L (ref 39–117)
ALT: 61 U/L — ABNORMAL HIGH (ref 0–53)
AST: 55 U/L — ABNORMAL HIGH (ref 0–37)
Albumin: 3.8 g/dL (ref 3.5–5.2)
BILIRUBIN TOTAL: 0.7 mg/dL (ref 0.2–1.2)
BUN: 17 mg/dL (ref 6–23)
CO2: 28 mEq/L (ref 19–32)
Calcium: 9.5 mg/dL (ref 8.4–10.5)
Chloride: 102 mEq/L (ref 96–112)
Creatinine, Ser: 1.1 mg/dL (ref 0.40–1.50)
GFR: 74.67 mL/min (ref >60.00)
Glucose, Bld: 97 mg/dL (ref 70–99)
Potassium: 4.7 mEq/L (ref 3.5–5.1)
Sodium: 137 mEq/L (ref 135–145)
Total Protein: 7.2 g/dL (ref 6.0–8.3)

## 2014-05-28 LAB — VITAMIN B12: VITAMIN B 12: 375 pg/mL (ref 211–911)

## 2014-05-29 LAB — RPR

## 2014-06-01 ENCOUNTER — Encounter: Payer: Self-pay | Admitting: Internal Medicine

## 2014-06-01 DIAGNOSIS — R748 Abnormal levels of other serum enzymes: Secondary | ICD-10-CM

## 2014-06-03 ENCOUNTER — Other Ambulatory Visit: Payer: Self-pay | Admitting: Internal Medicine

## 2014-06-03 MED ORDER — METOPROLOL SUCCINATE ER 25 MG PO TB24: 25.0000 mg | ORAL_TABLET | Freq: Every day | ORAL | Status: DC

## 2014-06-03 NOTE — Progress Notes (Signed)
BP is still high by April 6 office check,  Adding metoprolol XL one tablet daily  ,  25 mg dose

## 2014-06-04 NOTE — Progress Notes (Signed)
Left patient detailed message per DPR, of medictin added and to call office if any questions.

## 2014-06-04 NOTE — Telephone Encounter (Signed)
My chart message re ultrasound

## 2014-06-19 ENCOUNTER — Ambulatory Visit
Admit: 2014-06-19 | Disposition: A | Discharge status: Home / Self Care | Payer: Self-pay | Attending: Internal Medicine | Admitting: Internal Medicine

## 2014-06-19 ENCOUNTER — Other Ambulatory Visit: Payer: Self-pay | Admitting: Internal Medicine

## 2014-06-22 NOTE — Telephone Encounter (Signed)
90 day supply authorized and sent   

## 2014-06-22 NOTE — Telephone Encounter (Signed)
Last visit 05/22/14, requesting 90 day supply, ok refill?

## 2014-06-26 ENCOUNTER — Ambulatory Visit: Payer: BLUE CROSS/BLUE SHIELD | Admitting: Internal Medicine

## 2014-06-29 ENCOUNTER — Telehealth: Payer: Self-pay | Admitting: Internal Medicine

## 2014-06-29 DIAGNOSIS — R748 Abnormal levels of other serum enzymes: Secondary | ICD-10-CM

## 2014-06-29 DIAGNOSIS — M542 Cervicalgia: Secondary | ICD-10-CM

## 2014-06-29 NOTE — Telephone Encounter (Signed)
Patient would like to discuss neurosurgery during next visit also. Patient is having numbness at night during sleep in bilateral arms, but not during the day. Most of patient pain is related to the bottom of his neck stated by patient, patient next visit is for a CPE but patient is will to reschedule CPE if needed to discuss Neurosurgery referral and fatty liver. FYI

## 2014-06-29 NOTE — Telephone Encounter (Signed)
His ultrasound did suggest fatty liver .  Will discuss at next visit   Cervical spine MRI did show a problem on the left side due to degenerative disk diease.  He may have a pinched nerve root at the C5-6 level.  Neurosurgery referral is advised if having pain and/or weakness of left arm

## 2014-06-29 NOTE — Telephone Encounter (Signed)
Left message for patient to return call to office. 

## 2014-06-29 NOTE — Assessment & Plan Note (Addendum)
  Cervical spine MRI did show a problem on the left side due to degenerative disk diease.  He may have a pinched nerve root at the C5-6 level.  Neurosurgery referral is advised if having pain and/or weakness of left arm

## 2014-06-29 NOTE — Telephone Encounter (Signed)
See if we can add 15 min to 30 minute CPE visit

## 2014-06-30 NOTE — Telephone Encounter (Signed)
No problem , just keep the 11:30 open to allow more time

## 2014-06-30 NOTE — Telephone Encounter (Signed)
Another patient is scheduled for 10 Am on the 19th.

## 2014-07-09 ENCOUNTER — Ambulatory Visit (INDEPENDENT_AMBULATORY_CARE_PROVIDER_SITE_OTHER): Payer: BLUE CROSS/BLUE SHIELD | Admitting: Internal Medicine

## 2014-07-09 ENCOUNTER — Encounter: Payer: Self-pay | Admitting: Internal Medicine

## 2014-07-09 VITALS — BP 120/78 | HR 72 | Temp 98.1°F | Resp 14 | Ht 71.0 in | Wt 189.5 lb

## 2014-07-09 DIAGNOSIS — M47812 Spondylosis without myelopathy or radiculopathy, cervical region: Secondary | ICD-10-CM | POA: Diagnosis not present

## 2014-07-09 DIAGNOSIS — K760 Fatty (change of) liver, not elsewhere classified: Secondary | ICD-10-CM | POA: Diagnosis not present

## 2014-07-09 DIAGNOSIS — R739 Hyperglycemia, unspecified: Secondary | ICD-10-CM

## 2014-07-09 DIAGNOSIS — I10 Essential (primary) hypertension: Secondary | ICD-10-CM | POA: Diagnosis not present

## 2014-07-09 DIAGNOSIS — M542 Cervicalgia: Secondary | ICD-10-CM | POA: Diagnosis not present
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
## 2014-07-11 NOTE — Assessment & Plan Note (Addendum)
I have addressed  His fasting sugars and recommended a low glycemic index diet utilizing smaller more frequent meals to increase metabolism.  I have also commended patient on his weight loss g with a goal of 30 minutes of aerobic exercise a minimum of 5 days per week.  Lab Results  Component Value Date   HGBA1C 5.6 05/14/2014   Lab Results  Component Value Date   CHOL 275* 04/24/2014   HDL 77.60 04/24/2014   LDLCALC 161* 04/24/2014   LDLDIRECT 144.0 08/06/2012   TRIG 180.0* 04/24/2014   CHOLHDL 4 04/24/2014

## 2014-07-11 NOTE — Assessment & Plan Note (Signed)
Well controlled on current regimen. Renal function stable, no changes today. Lab Results  Component Value Date   CREATININE 1.10 05/28/2014   Lab Results  Component Value Date   NA 137 05/28/2014   K 4.7 05/28/2014   CL 102 05/28/2014   CO2 28 05/28/2014

## 2014-07-11 NOTE — Assessment & Plan Note (Signed)
Secondary to degenerative changes of the cervical spine with possible foraminal stenosis of the C5-6 nerve root on the left.  [facility],  As he has evidence of mild thenar muscle atrophy on the right,

## 2014-07-11 NOTE — Assessment & Plan Note (Signed)
Presumed by ultrasound changes and serologies negative for autoimmune causes of hepatitis.  Current liver enzymes are mildly elevated and will be repeated in 3 months.  all modifiable risk factors including obesity, alcohol abuse, and hyperlipidemia have been addressed He is requesting more time to address with diet since he is losing weight. . Referral to Dr. Ardine Engiehl in process.  Immunization for hep A and B underway  Lab Results  Component Value Date   ALT 61* 05/28/2014   AST 55* 05/28/2014   ALKPHOS 72 05/28/2014   BILITOT 0.7 05/28/2014  ' Lab Results  Component Value Date   CHOL 275* 04/24/2014   HDL 77.60 04/24/2014   LDLCALC 161* 04/24/2014   LDLDIRECT 144.0 08/06/2012   TRIG 180.0* 04/24/2014   CHOLHDL 4 04/24/2014

## 2014-07-15 ENCOUNTER — Encounter: Payer: Self-pay | Admitting: Internal Medicine

## 2014-07-23 ENCOUNTER — Encounter: Payer: Self-pay | Admitting: Internal Medicine

## 2014-07-23 ENCOUNTER — Ambulatory Visit (INDEPENDENT_AMBULATORY_CARE_PROVIDER_SITE_OTHER): Payer: BLUE CROSS/BLUE SHIELD | Admitting: Internal Medicine

## 2014-07-23 VITALS — BP 136/90 | HR 68 | Ht 70.5 in | Wt 192.8 lb

## 2014-07-23 DIAGNOSIS — K760 Fatty (change of) liver, not elsewhere classified: Secondary | ICD-10-CM

## 2014-07-23 DIAGNOSIS — K219 Gastro-esophageal reflux disease without esophagitis: Secondary | ICD-10-CM | POA: Diagnosis not present

## 2014-07-23 DIAGNOSIS — R7989 Other specified abnormal findings of blood chemistry: Secondary | ICD-10-CM | POA: Diagnosis not present

## 2014-07-23 DIAGNOSIS — R1310 Dysphagia, unspecified: Secondary | ICD-10-CM

## 2014-07-23 DIAGNOSIS — R945 Abnormal results of liver function studies: Secondary | ICD-10-CM

## 2014-07-23 DIAGNOSIS — Z1211 Encounter for screening for malignant neoplasm of colon: Secondary | ICD-10-CM

## 2014-07-23 NOTE — Progress Notes (Signed)
HISTORY OF PRESENT ILLNESS:  Jeff [name] is a 54 y.o. male with a history of hypertension, chronic alcohol use and gout who is referred today by Dr. Darrick Huntsman regarding chronic GERD and colon cancer screening. Outside records and laboratories have been reviewed. First, the patient reports a 10+ year history of chronic GERD as manifested by pyrosis and regurgitation. He has used when necessary at acids. No PPIs or H2 receptor antagonists. He does have mild intermittent dysphagia to solids and pills. He's had problems with raspy voice which has been felt possibly due to GERD. No weight loss. Next, the patient has chronic elevation of hepatic transaminases in the 50-60 range. Abdominal ultrasound performed 06/19/2014 revealed changes consistent with fatty liver. No other abnormalities. He has been referred to [facility] hepatology for evaluation of this issue. Finally, routine colon cancer screening. No family history of colon cancer. He does have chronic irritable bowel type complaints manifested by intermittent postprandial urgency with loose stools. This has been stable  REVIEW OF SYSTEMS:  All non-GI ROS negative except for arthritis,   Past Medical History  Diagnosis Date  . Glaucoma (increased eye pressure)   . Headache(784.0)     07/11/13- none in a long time  . GERD (gastroesophageal reflux disease)     with certain foods  . Arthritis   . Hypertension     no meds  . Claustrophobia   . Hepatic steatosis   . Hyperglycemia     Past Surgical History  Procedure Laterality Date  . Mini shunt insertion  03/09/2011    Procedure: INSERTION OF MINI SHUNT;  Surgeon: Chalmers Guest, MD;  Location: [facility] OR;  Service: Ophthalmology;  Laterality: Left;  . Finger surgery Right   . Eye surgery      2 on left eye  . Cataract extraction w/phaco Left 07/16/2013    Procedure: CATARACT EXTRACTION PHACO AND INTRAOCULAR LENS PLACEMENT (IOC) LEFT EYE;  Surgeon: Chalmers Guest, MD;  Location: [facility] OR;  Service:  Ophthalmology;  Laterality: Left;  . Cataract extraction w/phaco Right 10/29/2013    Procedure: CATARACT EXTRACTION PHACO AND INTRAOCULAR LENS PLACEMENT (IOC);  Surgeon: Chalmers Guest, MD;  Location: [facility] OR;  Service: Ophthalmology;  Laterality: Right;    Social History Jeff [name]  reports that he has never smoked. He has never used smokeless tobacco. He reports that he drinks about 3.6 - 4.8 oz of alcohol per week. He reports that he does not use illicit drugs.  family history includes Aneurysm in his brother and father; Asthma in his father, mother, and paternal aunt; Early death in his brother and sister; Heart disease in his father, mother, and sister; Hyperlipidemia in his father and mother; Hypertension in his father and mother; Stroke in his father. There is no history of Migraines.  Allergies  Allergen Reactions  . Keflex [Cephalexin] Rash       PHYSICAL EXAMINATION: Vital signs: BP 136/90 mmHg  Pulse 68  Ht 5' 10.5" (1.791 m)  Wt 192 lb 12.8 oz (87.454 kg)  BMI 27.26 kg/m2  Constitutional: generally well-appearing, no acute distress Psychiatric: alert and oriented x3, cooperative Eyes: extraocular movements intact, anicteric, conjunctiva pink Mouth: oral pharynx moist, no lesions Neck: supple no lymphadenopathy Cardiovascular: heart regular rate and rhythm, no murmur Lungs: clear to auscultation bilaterally Abdomen: soft, nontender, nondistended, no obvious ascites, no peritoneal signs, normal bowel sounds, no organomegaly Rectal: Deferred to colonoscopy Extremities: no lower extremity edema bilaterally Skin: no lesions on visible extremities Neuro: No focal deficits.  No asterixis.    ASSESSMENT:  #1. Chronic GERD. Rule out Barrett's esophagus #2. Intermittent solid food and pill dysphagia. Rule out peptic stricture #3. Hoarseness/raspy voice. Possibly GERD #4. Elevated hepatic transaminases. Most likely related to alcohol. #5. Fatty liver on ultrasound. Quite  common to develop fatty liver with chronic alcohol use #6. Colon cancer screening. Baseline risk   PLAN:  #1. Reflux precautions #2. Upper endoscopy with possible esophageal dilation.The nature of the procedure, as well as the risks, benefits, and alternatives were carefully and thoroughly reviewed with the patient. Ample time for discussion and questions allowed. The patient understood, was satisfied, and agreed to proceed. #3. Consider ENT evaluation for persistent hoarseness without definitive explanation #4. Significantly curved or eliminate alcohol use. #5. [facility] #6. Screening colonoscopy. Suprep prescribed. Patient instructed.The nature of the procedure, as well as the risks, benefits, and alternatives were carefully and thoroughly reviewed with the patient. Ample time for discussion and questions allowed. The patient understood, was satisfied, and agreed to proceed.  A copy of this consultation note has been sent to Dr. Darrick Huntsmanullo

## 2014-07-23 NOTE — Patient Instructions (Signed)
You have been scheduled for an endoscopy and colonoscopy. Please follow the written Please pick up your prep supplies at the pharmacy within the next 1-3 days. If you use inhalers (even only as needed), please bring them with you on the day of your procedure.

## 2014-08-05 ENCOUNTER — Telehealth: Payer: Self-pay | Admitting: Internal Medicine

## 2014-08-05 NOTE — Telephone Encounter (Signed)
Pt called to check on the status of [facility] referral. Please advise pt/msn

## 2014-08-13 ENCOUNTER — Other Ambulatory Visit: Payer: Self-pay | Admitting: Internal Medicine

## 2014-08-13 DIAGNOSIS — E785 Hyperlipidemia, unspecified: Secondary | ICD-10-CM

## 2014-08-13 DIAGNOSIS — R748 Abnormal levels of other serum enzymes: Secondary | ICD-10-CM

## 2014-08-14 ENCOUNTER — Other Ambulatory Visit (INDEPENDENT_AMBULATORY_CARE_PROVIDER_SITE_OTHER): Payer: BLUE CROSS/BLUE SHIELD

## 2014-08-14 DIAGNOSIS — R748 Abnormal levels of other serum enzymes: Secondary | ICD-10-CM | POA: Diagnosis not present

## 2014-08-14 DIAGNOSIS — E785 Hyperlipidemia, unspecified: Secondary | ICD-10-CM

## 2014-08-14 LAB — LIPID PANEL
CHOL/HDL RATIO: 4
Cholesterol: 271 mg/dL — ABNORMAL HIGH (ref 0–200)
HDL: 68.9 mg/dL (ref >39.00)
LDL CALC: 170 mg/dL — AB (ref 0–99)
NONHDL: 202.1
Triglycerides: 163 mg/dL — ABNORMAL HIGH (ref 0.0–149.0)
VLDL: 32.6 mg/dL (ref 0.0–40.0)

## 2014-08-14 LAB — COMPREHENSIVE METABOLIC PANEL
ALT: 58 U/L — ABNORMAL HIGH (ref 0–53)
AST: 46 U/L — AB (ref 0–37)
Albumin: 4 g/dL (ref 3.5–5.2)
Alkaline Phosphatase: 61 U/L (ref 39–117)
BUN: 25 mg/dL — ABNORMAL HIGH (ref 6–23)
CALCIUM: 9.5 mg/dL (ref 8.4–10.5)
CHLORIDE: 104 meq/L (ref 96–112)
CO2: 29 mEq/L (ref 19–32)
Creatinine, Ser: 1.41 mg/dL (ref 0.40–1.50)
GFR: 56.02 mL/min — ABNORMAL LOW (ref >60.00)
Glucose, Bld: 109 mg/dL — ABNORMAL HIGH (ref 70–99)
POTASSIUM: 4.8 meq/L (ref 3.5–5.1)
Sodium: 138 mEq/L (ref 135–145)
Total Bilirubin: 0.6 mg/dL (ref 0.2–1.2)
Total Protein: 7 g/dL (ref 6.0–8.3)

## 2014-08-17 ENCOUNTER — Encounter: Payer: Self-pay | Admitting: Internal Medicine

## 2014-10-09 ENCOUNTER — Ambulatory Visit (AMBULATORY_SURGERY_CENTER): Payer: BLUE CROSS/BLUE SHIELD | Admitting: Internal Medicine

## 2014-10-09 ENCOUNTER — Encounter: Payer: Self-pay | Admitting: Internal Medicine

## 2014-10-09 VITALS — BP 135/95 | HR 70 | Temp 97.0°F | Resp 16 | Ht 71.0 in | Wt 188.0 lb

## 2014-10-09 DIAGNOSIS — R1314 Dysphagia, pharyngoesophageal phase: Secondary | ICD-10-CM | POA: Diagnosis not present

## 2014-10-09 DIAGNOSIS — K219 Gastro-esophageal reflux disease without esophagitis: Secondary | ICD-10-CM | POA: Diagnosis not present

## 2014-10-09 DIAGNOSIS — Z1211 Encounter for screening for malignant neoplasm of colon: Secondary | ICD-10-CM | POA: Diagnosis not present

## 2014-10-09 MED ORDER — SODIUM CHLORIDE 0.9 % IV SOLN: 500.0000 mL | INTRAVENOUS | Status: DC

## 2014-10-09 MED ORDER — OMEPRAZOLE 40 MG PO CPDR: 40.0000 mg | DELAYED_RELEASE_CAPSULE | Freq: Every day | ORAL | Status: DC

## 2014-10-09 NOTE — Patient Instructions (Signed)
DR. Lamar Sprinkles OFFICE WILL CALL YOU WITH AN APPOINTMENT WITH DR. Suzanna Obey, ENT    YOU HAD AN ENDOSCOPIC PROCEDURE TODAY AT THE [facility]:   Refer to the procedure report that was given to you for any specific questions about what was found during the examination.  If the procedure report does not answer your questions, please call your gastroenterologist to clarify.  If you requested that your care partner not be given the details of your procedure findings, then the procedure report has been included in a sealed envelope for you to review at your convenience later.  YOU SHOULD EXPECT: Some feelings of bloating in the abdomen. Passage of more gas than usual.  Walking can help get rid of the air that was put into your GI tract during the procedure and reduce the bloating. If you had a lower endoscopy (such as a colonoscopy or flexible sigmoidoscopy) you may notice spotting of blood in your stool or on the toilet paper. If you underwent a bowel prep for your procedure, you may not have a normal bowel movement for a few days.  Please Note:  You might notice some irritation and congestion in your nose or some drainage.  This is from the oxygen used during your procedure.  There is no need for concern and it should clear up in a day or so.  SYMPTOMS TO REPORT IMMEDIATELY:   Following lower endoscopy (colonoscopy or flexible sigmoidoscopy):  Excessive amounts of blood in the stool  Significant tenderness or worsening of abdominal pain  Swelling of the abdomen that is new, acute  Fever of 100F or higher   Following upper endoscopy (EGD)  Vomiting of blood or coffee ground material  New chest pain or pain under the shoulder blades  Painful or persistently difficult swallowing  New shortness of breath  Fever of 100F or higher  Black, tarry-looking stools  For urgent or emergent issues, a gastroenterologist can be reached at any hour by calling [phone].   DIET: Your  first meal following the procedure should be a small meal and then it is ok to progress to your normal diet. Heavy or fried foods are harder to digest and may make you feel nauseous or bloated.  Likewise, meals heavy in dairy and vegetables can increase bloating.  Drink plenty of fluids but you should avoid alcoholic beverages for 24 hours.  ACTIVITY:  You should plan to take it easy for the rest of today and you should NOT DRIVE or use heavy machinery until tomorrow (because of the sedation medicines used during the test).    FOLLOW UP: Our staff will call the number listed on your records the next business day following your procedure to check on you and address any questions or concerns that you may have regarding the information given to you following your procedure. If we do not reach you, we will leave a message.  However, if you are feeling well and you are not experiencing any problems, there is no need to return our call.  We will assume that you have returned to your regular daily activities without incident.  If any biopsies were taken you will be contacted by phone or by letter within the next 1-3 weeks.  Please call us at [phone] if you have not heard about the biopsies in 3 weeks.    SIGNATURES/CONFIDENTIALITY: You and/or your care partner have signed paperwork which will be entered into your electronic medical record.  These signatures  attest to the fact that that the information above on your After Visit Summary has been reviewed and is understood.  Full responsibility of the confidentiality of this discharge information lies with you and/or your care-partner.

## 2014-10-09 NOTE — Op Note (Signed)
[facility] [address] [place], [address]   ENDOSCOPY PROCEDURE REPORT  PATIENT: Jeff, [name]  MR#: [id number] BIRTHDATE: 12/28/1960 , 54  yrs. old GENDER: male ENDOSCOPIST: Roxy Cedar, MD REFERRED BY:  Duncan Dull, M.D. PROCEDURE DATE:  10/09/2014 PROCEDURE:  EGD, diagnostic ASA CLASS:     Class II INDICATIONS:  dysphagia and history of esophageal reflux. MEDICATIONS: Monitored anesthesia care and Propofol 150 mg IV TOPICAL ANESTHETIC: none  DESCRIPTION OF PROCEDURE: After the risks benefits and alternatives of the procedure were thoroughly explained, informed consent was obtained.  The LB [id number] endoscope was introduced through the mouth and advanced to the second portion of the duodenum , Without limitations.  The instrument was slowly withdrawn as the mucosa was fully examined.   EXAM: The esophagus and gastroesophageal junction were completely normal in appearance.  The stomach was entered and closely examined.The antrum, angularis, and lesser curvature were well visualized, including a retroflexed view of the cardia and fundus. The stomach wall was normally distensable.  The scope passed easily through the pylorus into the duodenum.  Retroflexed views revealed no abnormalities.     The scope was then withdrawn from the patient and the procedure completed.  COMPLICATIONS: There were no immediate complications.  ENDOSCOPIC IMPRESSION: 1. Normal EGD 2. GERD (by history) 3. Persistent hoarseness . May be due to GERD. Formal ENT evaluation recommended  RECOMMENDATIONS: 1.  Anti-reflux regimen to be followed 2.  Prescribe omeprazole 40 mg by mouth daily; #30; 11 refills 3. ENT referral to Dr. Suzanna Obey "persistent hoarseness, please evaluate". My office will help arrange this for you  REPEAT EXAM:  eSigned:  Roxy Cedar, MD 10/09/2014 8:59 AM    CC:The Patient, Suzanna Obey, M.D., and Duncan Dull, MD

## 2014-10-09 NOTE — Op Note (Signed)
[facility] [address] [place], [address]   COLONOSCOPY PROCEDURE REPORT  PATIENT: Jeff [name], Jeff [name]  MR#: [id number] BIRTHDATE: [birth year] , 54  yrs. old GENDER: male ENDOSCOPIST: Roxy Cedar, MD REFERRED UX:[id number] Darrick Huntsman, M.D. PROCEDURE DATE:  10/09/2014 PROCEDURE:   Colonoscopy, screening First Screening Colonoscopy - Avg.  risk and is 50 yrs.  old or older Yes.  Prior Negative Screening - Now for repeat screening. N/A  History of Adenoma - Now for follow-up colonoscopy & has been > or = to 3 yrs.  N/A  Polyps removed today? No Recommend repeat exam, <10 yrs? No ASA CLASS:   Class II INDICATIONS:Screening for colonic neoplasia and Colorectal Neoplasm Risk Assessment for this procedure is average risk. MEDICATIONS: Propofol 250 mg IV and Monitored anesthesia care  DESCRIPTION OF PROCEDURE:   After the risks benefits and alternatives of the procedure were thoroughly explained, informed consent was obtained.  The digital rectal exam revealed no abnormalities of the rectum.   The LB [id number]  endoscope was introduced through the anus and advanced to the cecum, which was identified by both the appendix and ileocecal valve. No adverse events experienced.   The quality of the prep was excellent. (Suprep was used)  The instrument was then slowly withdrawn as the colon was fully examined. Estimated blood loss is zero unless otherwise noted in this procedure report.    COLON FINDINGS: There was moderate diverticulosis noted at the cecum and in the left colon.   The examination was otherwise normal. Retroflexed views revealed internal hemorrhoids. The time to cecum = 1.5 Withdrawal time = 10.5   The scope was withdrawn and the procedure completed. COMPLICATIONS: There were no immediate complications.  ENDOSCOPIC IMPRESSION: 1.   Moderate diverticulosis was noted at the cecum and in the left colon 2.   The examination was otherwise  normal  RECOMMENDATIONS: 1.  Continue current colorectal screening recommendations for "routine risk" patients with a repeat colonoscopy in 10 years. 2.  Upper endoscopy today (please see report)  eSigned:  Roxy Cedar, MD 10/09/2014 8:48 AM   cc: The Patient and Duncan Dull, MD

## 2014-10-09 NOTE — Progress Notes (Signed)
Transferred to recovery room. A/O x3, pleased with MAC.  VSS.  Report to Karen, RN. 

## 2014-10-12 ENCOUNTER — Telehealth: Payer: Self-pay | Admitting: *Deleted

## 2014-10-12 NOTE — Telephone Encounter (Signed)
Left message on f/u call 

## 2014-10-13 ENCOUNTER — Telehealth: Payer: Self-pay

## 2014-10-13 NOTE — Telephone Encounter (Signed)
Pt scheduled to see Dr. Jearld Fenton, ENT, for chronic hoarseness 10/28/14@2 :30pm, pt to arrive there at 2:10pm. Records faxed to office at [phone]. Pt needs to be sure and bring his Ins card, copay, and list of meds. Left message for pt to call back.

## 2014-10-14 NOTE — Telephone Encounter (Signed)
Left message for pt to call back.  After multiple attempts have been unable to reach pt by phone. Letter mailed to pt regarding ENT appt.

## 2014-10-27 ENCOUNTER — Telehealth: Payer: Self-pay | Admitting: Surgical

## 2014-10-27 MED ORDER — TRAZODONE HCL 50 MG PO TABS: 50.0000 mg | ORAL_TABLET | Freq: Every day | ORAL | Status: DC | PRN

## 2014-10-27 NOTE — Telephone Encounter (Signed)
90 day supply authorized and sent   

## 2014-10-27 NOTE — Telephone Encounter (Signed)
Pharmacy is faxing over for a refill of Trazodone 50 MG Tablet. Please advise on refill

## 2014-11-25 ENCOUNTER — Telehealth: Payer: Self-pay | Admitting: Internal Medicine

## 2014-11-25 NOTE — Telephone Encounter (Signed)
Forwarded to [employer]

## 2014-11-25 NOTE — Telephone Encounter (Signed)
Pt called about his right knee swollen and painful. Pt wanted to come in in the am. Let me know where to schedule pt. Pt is not able to come in today appt with other provider was offered. Pt wanted to speak with Olegario Messier. Thank You!

## 2014-11-25 NOTE — Telephone Encounter (Signed)
Please add patient to schedule for 11/26/14 at 11.30 patient aware. For knee pain.

## 2014-11-25 NOTE — Telephone Encounter (Signed)
Patient has been scheduled

## 2014-11-26 ENCOUNTER — Encounter: Payer: Self-pay | Admitting: Internal Medicine

## 2014-11-26 ENCOUNTER — Ambulatory Visit (INDEPENDENT_AMBULATORY_CARE_PROVIDER_SITE_OTHER): Payer: BLUE CROSS/BLUE SHIELD | Admitting: Internal Medicine

## 2014-11-26 VITALS — BP 140/92 | HR 64 | Temp 98.0°F | Resp 12 | Ht 70.5 in | Wt 189.4 lb

## 2014-11-26 DIAGNOSIS — M7051 Other bursitis of knee, right knee: Secondary | ICD-10-CM | POA: Diagnosis not present

## 2014-11-26 DIAGNOSIS — M7041 Prepatellar bursitis, right knee: Secondary | ICD-10-CM | POA: Diagnosis not present

## 2014-11-26 MED ORDER — HYDROCODONE-ACETAMINOPHEN 10-325 MG PO TABS: 1.0000 | ORAL_TABLET | Freq: Three times a day (TID) | ORAL | Status: DC | PRN

## 2014-11-26 MED ORDER — TRIAMCINOLONE ACETONIDE 40 MG/ML IJ SUSP: 40.0000 mg | Freq: Once | INTRAMUSCULAR | Status: DC

## 2014-11-26 MED ORDER — LIDOCAINE HCL 1 % IJ SOLN: 4.0000 mL | Freq: Once | INTRAMUSCULAR | Status: DC

## 2014-11-26 MED ORDER — PREDNISONE 10 MG PO TABS: ORAL_TABLET | ORAL | Status: DC

## 2014-11-26 NOTE — Progress Notes (Signed)
Subjective:  Patient ID: Jeff [name], male    DOB: [birth year]  Age: 54 y.o. MRN: [id number]  CC: The primary encounter diagnosis was Bursitis of knee, right. A diagnosis of Prepatellar bursitis of right knee was also pertinent to this visit.  HPI Onix Jumper Bridgett presents for RIGHT KNEE PAIN.  Symptoms started with swelling 4 days ago accompanied by mild tenderness to palpation  After working out his legs at the gym followed by spending about an hour on is knees cleaning out his care.    By Monday after work noticed a little more swelling ,  throbbing .  Worked out hard on legs with squats,  presses and lunges on the saturday before pain and swelling started.  Started icing and heating  And taking tramadol  .     Outpatient Prescriptions Prior to Visit  Medication Sig Dispense Refill  . albuterol (PROVENTIL HFA;VENTOLIN HFA) 108 (90 BASE) MCG/ACT inhaler Inhale 2 puffs into the lungs every 6 (six) hours as needed for wheezing or shortness of breath. 1 Inhaler 11  . allopurinol (ZYLOPRIM) 300 MG tablet Take 0.5 tablets (150 mg total) by mouth daily. 90 tablet 1  . bismuth subsalicylate (PEPTO BISMOL) 262 MG/15ML suspension Take 15 mLs by mouth every 6 (six) hours as needed for indigestion or diarrhea or loose stools.    . colchicine 0.6 MG tablet Take 0.6 mg by mouth daily.    . COSOPT PF 22.3-6.8 MG/ML SOLN Apply 2 drops topically at bedtime. To the right Eye only    . loperamide (IMODIUM A-D) 2 MG tablet Take 2 mg by mouth daily as needed for diarrhea or loose stools.    Marland Kitchen losartan (COZAAR) 100 MG tablet Take 1 tablet (100 mg total) by mouth daily. 90 tablet 3  . metoprolol succinate (TOPROL-XL) 25 MG 24 hr tablet Take 1 tablet (25 mg total) by mouth daily. 90 tablet 3  . omeprazole (PRILOSEC) 40 MG capsule Take 1 capsule (40 mg total) by mouth daily. 30 capsule 11  . PRED FORTE 1 % ophthalmic suspension   6  . tiZANidine (ZANAFLEX) 4 MG tablet TAKE 1 TABLET (4 MG TOTAL) BY MOUTH AT  BEDTIME. 90 tablet 1  . traMADol (ULTRAM) 50 MG tablet Take 1 tablet (50 mg total) by mouth every 6 (six) hours as needed. 90 tablet 2  . traZODone (DESYREL) 50 MG tablet Take 1 tablet (50 mg total) by mouth daily as needed for sleep. 90 tablet 1   No facility-administered medications prior to visit.    Review of Systems;  Patient denies headache, fevers, malaise, unintentional weight loss, skin rash, eye pain, sinus congestion and sinus pain, sore throat, dysphagia,  hemoptysis , cough, dyspnea, wheezing, chest pain, palpitations, orthopnea, edema, abdominal pain, nausea, melena, diarrhea, constipation, flank pain, dysuria, hematuria, urinary  Frequency, nocturia, numbness, tingling, seizures,  Focal weakness, Loss of consciousness,  Tremor, insomnia, depression, anxiety, and suicidal ideation.      Objective:  BP 140/92 mmHg  Pulse 64  Temp(Src) 98 F (36.7 C) (Oral)  Resp 12  Ht 5' 10.5" (1.791 m)  Wt 189 lb 6 oz (85.9 kg)  BMI 26.78 kg/m2  SpO2 95%  BP Readings from Last 3 Encounters:  11/26/14 140/92  10/09/14 135/95  07/23/14 136/90    Wt Readings from Last 3 Encounters:  11/26/14 189 lb 6 oz (85.9 kg)  10/09/14 188 lb (85.276 kg)  07/23/14 192 lb 12.8 oz (87.454 kg)    General  appearance: alert, cooperative and appears stated age Abdomen: soft, non-tender; bowel sounds normal; no masses,  no organomegaly Pulses: 2+ and symmetric Skin: Skin color, texture, turgor normal. No rashes or lesions Lymph nodes: Cervical, supraclavicular, and axillary nodes normal. MSKL right knee with prepatellar effusion, swelling   Assessment & Plan:   Problem List Items Addressed This Visit    Prepatellar bursitis of right knee    Informed consent was obtained to aspiration of fluid from prepatellar and patellar area.  18ccs of serous fluid was drained from prepatellar area on the second attempt.  40 mg Kenolog and 4 ml of 1% xylocaine was injected into the patellar bursa under  sterile conditions with no immediate complications and was tolerated well. He was advised to wear a compressive dressing on the knee for 48 hours,  Avoid weight bearing for 48 hours as much as possible and continue icing the knee       Relevant Medications   predniSONE (DELTASONE) 10 MG tablet   HYDROcodone-acetaminophen (NORCO) 10-325 MG tablet   triamcinolone acetonide (KENALOG-40) injection 40 mg    Other Visit Diagnoses    Bursitis of knee, right    -  Primary    Relevant Medications    predniSONE (DELTASONE) 10 MG tablet    HYDROcodone-acetaminophen (NORCO) 10-325 MG tablet    triamcinolone acetonide (KENALOG-40) injection 40 mg    lidocaine (XYLOCAINE) 1 % (with pres) injection 4 mL       I am having Mr. Weckwerth start on predniSONE and HYDROcodone-acetaminophen. I am also having him maintain his loperamide, bismuth subsalicylate, allopurinol, colchicine, COSOPT PF, traMADol, losartan, albuterol, metoprolol succinate, tiZANidine, PRED FORTE, omeprazole, and traZODone. We will continue to administer triamcinolone acetonide and lidocaine.  Meds ordered this encounter  Medications  . predniSONE (DELTASONE) 10 MG tablet    Sig: 6 tablets on Day 1 , then reduce by 1 tablet daily until gone    Dispense:  21 tablet    Refill:  0  . HYDROcodone-acetaminophen (NORCO) 10-325 MG tablet    Sig: Take 1 tablet by mouth every 8 (eight) hours as needed.    Dispense:  30 tablet    Refill:  0  . triamcinolone acetonide (KENALOG-40) injection 40 mg    Sig:   . lidocaine (XYLOCAINE) 1 % (with pres) injection 4 mL    Sig:     There are no discontinued medications.  Follow-up: No Follow-up on file.   Sherlene Shams, MD

## 2014-11-26 NOTE — Progress Notes (Signed)
Pre-visit discussion using our clinic review tool. No additional management support is needed unless otherwise documented below in the visit note.  

## 2014-11-26 NOTE — Patient Instructions (Signed)
Knee Injection, Care After Refer to this sheet in the next few weeks. These instructions provide you with information about caring for yourself after your procedure. Your health care provider may also give you more specific instructions. Your treatment has been planned according to current medical practices, but problems sometimes occur. Call your health care provider if you have any problems or questions after your procedure. WHAT TO EXPECT AFTER THE PROCEDURE After your procedure, it is common to have:  Soreness.  Warmth.  Swelling. You may have more pain, swelling, and warmth than you did before the injection. This reaction may last for about one day.  HOME CARE INSTRUCTIONS Bathing  If you were given a bandage (dressing), keep it dry until your health care provider says it can be removed. Ask your health care provider when you can start showering or taking a bath. Managing Pain, Stiffness, and Swelling  If directed, apply ice to the injection area:  Put ice in a plastic bag.  Place a towel between your skin and the bag.  Leave the ice on for 20 minutes, 2-3 times per day.  Do not apply heat to your knee.  Raise the injection area above the level of your heart while you are sitting or lying down. Activity  Avoid strenuous activities for as long as directed by your health care provider. Ask your health care provider when you can return to your normal activities. General Instructions  Take medicines only as directed by your health care provider.  Do not take aspirin or other over-the-counter medicines unless your health care provider says you can.  Check your injection site every day for signs of infection. Watch for:  Redness, swelling, or pain.  Fluid, blood, or pus.  Follow your health care provider's instructions about dressing changes and removal. SEEK MEDICAL CARE IF:  You have symptoms at your injection site that last longer than two days after your  procedure.  You have redness, swelling, or pain in your injection area.  You have fluid, blood, or pus coming from your injection site.  You have warmth in your injection area.  You have a fever.  Your pain is not controlled with medicine. SEEK IMMEDIATE MEDICAL CARE IF:  Your knee turns very red.  Your knee becomes very swollen.  Your knee pain is severe.   This information is not intended to replace advice given to you by your health care provider. Make sure you discuss any questions you have with your health care provider.   Document Released: 02/27/2014 Document Reviewed: 02/27/2014 Elsevier Interactive Patient Education 2016 [employer]  

## 2014-11-28 DIAGNOSIS — M7041 Prepatellar bursitis, right knee: Secondary | ICD-10-CM | POA: Insufficient documentation

## 2014-11-28 NOTE — Assessment & Plan Note (Addendum)
Informed consent was obtained to aspiration of fluid from prepatellar and patellar area.  18ccs of serous fluid was drained from prepatellar area on the second attempt.  40 mg Kenolog and 4 ml of 1% xylocaine was injected into the patellar bursa under sterile conditions with no immediate complications and was tolerated well. He was advised to wear a compressive dressing on the knee for 48 hours,  Avoid weight bearing for 48 hours as much as possible and continue icing the knee

## 2014-12-08 ENCOUNTER — Telehealth: Payer: Self-pay

## 2014-12-08 DIAGNOSIS — R1314 Dysphagia, pharyngoesophageal phase: Secondary | ICD-10-CM

## 2014-12-08 DIAGNOSIS — K219 Gastro-esophageal reflux disease without esophagitis: Secondary | ICD-10-CM

## 2014-12-08 MED ORDER — OMEPRAZOLE 40 MG PO CPDR: 40.0000 mg | DELAYED_RELEASE_CAPSULE | Freq: Every day | ORAL | Status: DC

## 2014-12-08 NOTE — Telephone Encounter (Signed)
Resent Omeprazole rx - 90 day supply

## 2014-12-24 ENCOUNTER — Other Ambulatory Visit: Payer: Self-pay | Admitting: Internal Medicine

## 2014-12-25 ENCOUNTER — Telehealth: Payer: Self-pay | Admitting: *Deleted

## 2014-12-25 NOTE — Telephone Encounter (Signed)
Please advise 

## 2014-12-25 NOTE — Telephone Encounter (Signed)
Patient has requested an update on a medication refill for tramadol

## 2014-12-26 ENCOUNTER — Other Ambulatory Visit: Payer: Self-pay | Admitting: Internal Medicine

## 2014-12-26 MED ORDER — TRAMADOL HCL 50 MG PO TABS: 50.0000 mg | ORAL_TABLET | Freq: Four times a day (QID) | ORAL | Status: DC | PRN

## 2014-12-26 NOTE — Telephone Encounter (Signed)
rx called to pharmacy on Saturday morning.

## 2014-12-26 NOTE — Telephone Encounter (Signed)
Didn't see this until Saturday.  Phoned in to [facility]

## 2014-12-30 NOTE — Telephone Encounter (Signed)
Left a message for patient that prescription was sent to [facility].

## 2015-01-20 ENCOUNTER — Telehealth: Payer: Self-pay | Admitting: Internal Medicine

## 2015-01-20 NOTE — Telephone Encounter (Signed)
Anything other then Aleve you can recommended

## 2015-01-20 NOTE — Telephone Encounter (Signed)
Pt called about his right knee acting up again and hurting. Pt wants to know what medication can he take? If pt does not answer leave a message. Pharmacy is [id number] - [place], [place] - [address] Thank You!

## 2015-01-21 NOTE — Telephone Encounter (Signed)
[facility] Patient stated he missed a call, in reguards to medication.

## 2015-01-21 NOTE — Telephone Encounter (Signed)
Patient notified and voiced understanding.

## 2015-01-21 NOTE — Telephone Encounter (Signed)
Left message to call office

## 2015-01-21 NOTE — Telephone Encounter (Signed)
He has tramadol as well per chart,  The combination should be effective.  otherwose needs to see orthopedics

## 2015-05-17 ENCOUNTER — Other Ambulatory Visit: Payer: Self-pay | Admitting: Internal Medicine

## 2015-05-19 ENCOUNTER — Other Ambulatory Visit: Payer: Self-pay | Admitting: Internal Medicine

## 2015-05-19 MED ORDER — ALLOPURINOL 300 MG PO TABS: ORAL_TABLET | ORAL | Status: DC

## 2015-05-19 NOTE — Telephone Encounter (Signed)
Last refill submission failed-resent Allopurinol Rx

## 2015-05-19 NOTE — Addendum Note (Signed)
Addended by: Warden FillersWRIGHT, Donnis Pecha S on: 05/19/2015 04:08 PM   Modules accepted: Orders

## 2015-05-20 ENCOUNTER — Other Ambulatory Visit: Payer: Self-pay

## 2015-05-20 NOTE — Telephone Encounter (Signed)
Last OV was in October 2016, please advise refill as you have not sent in before.  Thanks

## 2015-05-21 ENCOUNTER — Other Ambulatory Visit: Payer: Self-pay | Admitting: Internal Medicine

## 2015-05-21 MED ORDER — COLCHICINE 0.6 MG PO TABS: 0.6000 mg | ORAL_TABLET | Freq: Two times a day (BID) | ORAL | Status: DC

## 2015-05-21 NOTE — Telephone Encounter (Signed)
Refilled for 30 days only.  OFFICE VISIT NEEDED prior to any more refills 

## 2015-05-21 NOTE — Telephone Encounter (Signed)
Refilled 12/2014. Please advise?

## 2015-05-21 NOTE — Telephone Encounter (Signed)
Refill denied.  Needs to have 6 MONTH FOLLOW UP

## 2015-05-31 ENCOUNTER — Other Ambulatory Visit: Payer: Self-pay | Admitting: Internal Medicine

## 2015-05-31 MED ORDER — TRAMADOL HCL 50 MG PO TABS: 50.0000 mg | ORAL_TABLET | Freq: Four times a day (QID) | ORAL | Status: DC | PRN

## 2015-05-31 NOTE — Telephone Encounter (Signed)
Refill for 30 days only.   

## 2015-05-31 NOTE — Telephone Encounter (Signed)
Pt called and set up physical appt on 5/19 @ 3:30pm. Pt needs a refill on traMADol (ULTRAM) 50 MG tablet. Pharmacy is [id number] - [place], [place] - [address] Call pt @ [phone]. Thank you!

## 2015-05-31 NOTE — Telephone Encounter (Signed)
Please advise, for refill as patient scheduled follow up appt for 5/19.  Thanks

## 2015-06-03 ENCOUNTER — Other Ambulatory Visit: Payer: Self-pay

## 2015-06-03 MED ORDER — TRAZODONE HCL 50 MG PO TABS: 50.0000 mg | ORAL_TABLET | Freq: Every day | ORAL | Status: DC | PRN

## 2015-06-15 DIAGNOSIS — M1A00X0 Idiopathic chronic gout, unspecified site, without tophus (tophi): Secondary | ICD-10-CM | POA: Insufficient documentation

## 2015-06-15 DIAGNOSIS — E785 Hyperlipidemia, unspecified: Secondary | ICD-10-CM | POA: Insufficient documentation

## 2015-06-15 DIAGNOSIS — E6609 Other obesity due to excess calories: Secondary | ICD-10-CM | POA: Insufficient documentation

## 2015-06-17 LAB — HEPATIC FUNCTION PANEL
ALT: 47 U/L — AB (ref 10–40)
AST: 38 U/L (ref 14–40)
Bilirubin, Total: 1.1 mg/dL

## 2015-06-17 LAB — BASIC METABOLIC PANEL
BUN: 12 mg/dL (ref 4–21)
Creatinine: 0.9 mg/dL (ref 0.6–1.3)
Glucose: 102 mg/dL
Potassium: 4.8 mmol/L (ref 3.4–5.3)
SODIUM: 137 mmol/L (ref 137–147)

## 2015-06-17 LAB — CBC AND DIFFERENTIAL
HEMOGLOBIN: 14.6 g/dL (ref 13.5–17.5)
Platelets: 176 10*3/uL (ref 150–399)
WBC: 4.2 10^3/mL

## 2015-06-25 ENCOUNTER — Other Ambulatory Visit: Payer: Self-pay | Admitting: Internal Medicine

## 2015-07-07 ENCOUNTER — Telehealth: Payer: Self-pay | Admitting: Internal Medicine

## 2015-07-07 NOTE — Telephone Encounter (Signed)
Pt has a physical on Friday and wondered if he needed to fast before coming to appointment. Will Dr. Darrick Huntsmanullo want to do labs?

## 2015-07-07 NOTE — Telephone Encounter (Signed)
Spoke to patient. Advised ok to eat breakfast early and skip lunch on appt date. Patient verbalized understanding.

## 2015-07-09 ENCOUNTER — Ambulatory Visit (INDEPENDENT_AMBULATORY_CARE_PROVIDER_SITE_OTHER): Payer: BLUE CROSS/BLUE SHIELD | Admitting: Internal Medicine

## 2015-07-09 ENCOUNTER — Encounter: Payer: Self-pay | Admitting: Internal Medicine

## 2015-07-09 VITALS — BP 120/84 | HR 64 | Temp 97.9°F | Ht 70.5 in | Wt 190.0 lb

## 2015-07-09 DIAGNOSIS — R7301 Impaired fasting glucose: Secondary | ICD-10-CM

## 2015-07-09 DIAGNOSIS — Z125 Encounter for screening for malignant neoplasm of prostate: Secondary | ICD-10-CM

## 2015-07-09 DIAGNOSIS — R748 Abnormal levels of other serum enzymes: Secondary | ICD-10-CM

## 2015-07-09 DIAGNOSIS — I5022 Chronic systolic (congestive) heart failure: Secondary | ICD-10-CM | POA: Diagnosis not present

## 2015-07-09 DIAGNOSIS — K760 Fatty (change of) liver, not elsewhere classified: Secondary | ICD-10-CM

## 2015-07-09 DIAGNOSIS — I10 Essential (primary) hypertension: Secondary | ICD-10-CM

## 2015-07-09 DIAGNOSIS — R419 Unspecified symptoms and signs involving cognitive functions and awareness: Secondary | ICD-10-CM

## 2015-07-09 DIAGNOSIS — E785 Hyperlipidemia, unspecified: Secondary | ICD-10-CM | POA: Diagnosis not present

## 2015-07-09 DIAGNOSIS — F320 Major depressive disorder, single episode, mild: Secondary | ICD-10-CM

## 2015-07-09 LAB — IRON AND TIBC
%SAT: 26 % (ref 15–60)
IRON: 106 ug/dL (ref 50–180)
TIBC: 404 ug/dL (ref 250–425)
UIBC: 298 ug/dL (ref 125–400)

## 2015-07-09 LAB — LIPID PANEL
Cholesterol: 238 mg/dL — ABNORMAL HIGH (ref 125–200)
HDL: 58 mg/dL (ref >=40)
LDL CALC: 154 mg/dL — AB (ref <130)
TRIGLYCERIDES: 131 mg/dL (ref <150)
Total CHOL/HDL Ratio: 4.1 Ratio (ref <=5.0)
VLDL: 26 mg/dL (ref <30)

## 2015-07-09 LAB — HEPATIC FUNCTION PANEL
ALBUMIN: 3.9 g/dL (ref 3.6–5.1)
ALK PHOS: 46 U/L (ref 40–115)
ALT: 34 U/L (ref 9–46)
AST: 26 U/L (ref 10–35)
Bilirubin, Direct: 0.1 mg/dL (ref <=0.2)
Indirect Bilirubin: 0.5 mg/dL (ref 0.2–1.2)
TOTAL PROTEIN: 6.6 g/dL (ref 6.1–8.1)
Total Bilirubin: 0.6 mg/dL (ref 0.2–1.2)

## 2015-07-09 MED ORDER — PREDNISONE 10 MG PO TABS: ORAL_TABLET | ORAL | Status: DC

## 2015-07-09 MED ORDER — BUPROPION HCL 100 MG PO TABS: 100.0000 mg | ORAL_TABLET | Freq: Two times a day (BID) | ORAL | Status: DC

## 2015-07-09 NOTE — Progress Notes (Signed)
Patient ID: Jeff HeckWilliam Keith Pagaduan, male    DOB: 05/30/1960  Age: 55 y.o. MRN: [id number]    Review of Systems  Objective:  BP 120/84 mmHg  Pulse 64  Temp(Src) 97.9 F (36.6 C) (Oral)  Ht 5' 10.5" (1.791 m)  Wt 190 lb (86.183 kg)  BMI 26.87 kg/m2  Physical Exam    Assessment & Plan:   Problem List Items Addressed This Visit    None

## 2015-07-09 NOTE — Patient Instructions (Addendum)
I am recommending a trial of wellbutrin for your depression. Please start the medication and take the first dose in the morning, the second dose by 3 pm .  You can increase the dose to 150 mg after two weeks if you can break the tablets in half.    I'll see you in  3-4 weeks   Cardiology referral to evaluate heart given the results of the liver biopsy suggesting "congestion"

## 2015-07-09 NOTE — Progress Notes (Signed)
Pre visit review using our clinic review tool, if applicable. No additional management support is needed unless otherwise documented below in the visit note. 

## 2015-07-10 LAB — PSA: PSA: 2.38 ng/mL (ref <=4.00)

## 2015-07-10 LAB — HEMOGLOBIN A1C
HEMOGLOBIN A1C: 5.4 % (ref <5.7)
MEAN PLASMA GLUCOSE: 108 mg/dL

## 2015-07-10 LAB — FERRITIN: Ferritin: 180 ng/mL (ref 20–380)

## 2015-07-11 ENCOUNTER — Encounter: Payer: Self-pay | Admitting: Internal Medicine

## 2015-07-11 DIAGNOSIS — F329 Major depressive disorder, single episode, unspecified: Secondary | ICD-10-CM

## 2015-07-11 DIAGNOSIS — F321 Major depressive disorder, single episode, moderate: Secondary | ICD-10-CM | POA: Insufficient documentation

## 2015-07-11 NOTE — Progress Notes (Signed)
Subjective:  Patient ID: Jeff HeckWilliam Keith [name], male    DOB: 11/14/1960  Age: 55 y.o. MRN: [id number]  CC: The primary encounter diagnosis was Chronic systolic congestive heart failure (HCC). Diagnoses of Impaired fasting blood sugar, Hyperlipidemia, Prostate cancer screening, Elevated liver enzymes, Impaired fasting glucose, Hepatic steatosis, Cognitive complaints, Major depressive disorder, single episode, mild (HCC), and Essential hypertension were also pertinent to this visit.  HPI Jeff [name] presents for follow up on multiple issues.  40 minutes spent face to face time  with patient today  Discussing the following issues:   Hyperlipidemia  And elevated liver enzymes:  He had a liver biopsy done last Friday .  Has not had results yet.   Dicussed. The results that were available on line through  The [facility] to [facility].  Liver biopsy suggested multiple  Etiologies including CHF .  No prior ECHO   Alcohol abuse.  He has not drank any alcohol in over a week.  Not sleeping well despite this and taking  trazodone. Nods off but wakes up after an hour.  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  Jeff [name]  First surgery was done  2014   Having eye pain and loss of vision using timolol and another eye drop   Decreased concentration : he is having a hard time focusing and finishing tasks. Sometimes has to labor to speak articulately,  Feels like he is in a haze. No prior testing for ADD but thinks he may have it due to having many of the symptoms.  Has been seeing a therapist for help in quitting alcohol.  ,  Feels depressed. No energy.  Discussed Wellbutrin   .Financial and emotional stressors.  He is planning to downsize from family estate.  Feeling very conflicted about selling the family estate that his parents both asked him not to before they died.      Outpatient Prescriptions Prior to Visit  Medication  Sig Dispense Refill  . losartan (COZAAR) 100 MG tablet TAKE 1 TABLET (100 MG TOTAL) BY MOUTH DAILY. 90 tablet 0  . metoprolol succinate (TOPROL-XL) 25 MG 24 hr tablet TAKE 1 TABLET (25 MG TOTAL) BY MOUTH DAILY. 90 tablet 1  . PRED FORTE 1 % ophthalmic suspension   6  . tiZANidine (ZANAFLEX) 4 MG tablet TAKE 1 TABLET (4 MG TOTAL) BY MOUTH AT BEDTIME. 90 tablet 1  . traMADol (ULTRAM) 50 MG tablet Take 1 tablet (50 mg total) by mouth every 6 (six) hours as needed. 90 tablet 0  . traZODone (DESYREL) 50 MG tablet Take 1 tablet (50 mg total) by mouth daily as needed for sleep. 90 tablet 1  . bismuth subsalicylate (PEPTO BISMOL) 262 MG/15ML suspension Take 15 mLs by mouth every 6 (six) hours as needed for indigestion or diarrhea or loose stools.    . COSOPT PF 22.3-6.8 MG/ML SOLN Apply 2 drops topically at bedtime. To the right Eye only    . HYDROcodone-acetaminophen (NORCO) 10-325 MG tablet Take 1 tablet by mouth every 8 (eight) hours as needed. 30 tablet 0  . loperamide (IMODIUM A-D) 2 MG tablet Take 2 mg by mouth daily as needed for diarrhea or loose stools.    . predniSONE (DELTASONE) 10 MG tablet 6 tablets on Day 1 , then reduce by 1 tablet daily until gone 21 tablet 0  . albuterol (  PROVENTIL HFA;VENTOLIN HFA) 108 (90 BASE) MCG/ACT inhaler Inhale 2 puffs into the lungs every 6 (six) hours as needed for wheezing or shortness of breath. (Patient not taking: Reported on 07/09/2015) 1 Inhaler 11  . allopurinol (ZYLOPRIM) 300 MG tablet TAKE 1/2 TABLETS (150 MG TOTAL) BY MOUTH DAILY. (Patient not taking: Reported on 07/09/2015) 90 tablet 0  . colchicine 0.6 MG tablet Take 1 tablet (0.6 mg total) by mouth 2 (two) times daily. As needed for gout attack (Patient not taking: Reported on 07/09/2015) 30 tablet 0  . omeprazole (PRILOSEC) 40 MG capsule Take 1 capsule (40 mg total) by mouth daily. (Patient not taking: Reported on 07/09/2015) 90 capsule 3   Facility-Administered Medications Prior to Visit  Medication  Dose Route Frequency Provider Last Rate Last Dose  . lidocaine (XYLOCAINE) 1 % (with pres) injection 4 mL  4 mL Other Once Sherlene Shams, MD      . triamcinolone acetonide (KENALOG-40) injection 40 mg  40 mg Intra-articular Once Sherlene Shams, MD        Review of Systems;  Patient denies headache, fevers, malaise, unintentional weight loss, skin rash, eye pain, sinus congestion and sinus pain, sore throat, dysphagia,  hemoptysis , cough, dyspnea, wheezing, chest pain, palpitations, orthopnea, edema, abdominal pain, nausea, melena, diarrhea, constipation, flank pain, dysuria, hematuria, urinary  Frequency, nocturia, numbness, tingling, seizures,  Focal weakness, Loss of consciousness,  Tremor, insomnia, depression, anxiety, and suicidal ideation.      Objective:  BP 120/84 mmHg  Pulse 64  Temp(Src) 97.9 F (36.6 C) (Oral)  Ht 5' 10.5" (1.791 m)  Wt 190 lb (86.183 kg)  BMI 26.87 kg/m2  BP Readings from Last 3 Encounters:  07/09/15 120/84  11/26/14 140/92  10/09/14 135/95    Wt Readings from Last 3 Encounters:  07/09/15 190 lb (86.183 kg)  11/26/14 189 lb 6 oz (85.9 kg)  10/09/14 188 lb (85.276 kg)    General appearance: alert, cooperative and appears stated age Ears: normal TM's and external ear canals both ears Throat: lips, mucosa, and tongue normal; teeth and gums normal Neck: no adenopathy, no carotid bruit, supple, symmetrical, trachea midline and thyroid not enlarged, symmetric, no tenderness/mass/nodules Back: symmetric, no curvature. ROM normal. No CVA tenderness. Lungs: clear to auscultation bilaterally Heart: regular rate and rhythm, S1, S2 normal, no murmur, click, rub or gallop Abdomen: soft, non-tender; bowel sounds normal; no masses,  no organomegaly Pulses: 2+ and symmetric Skin: Skin color, texture, turgor normal. No rashes or lesions Lymph nodes: Cervical, supraclavicular, and axillary nodes normal.  Lab Results  Component Value Date   HGBA1C 5.4  07/09/2015   HGBA1C 5.6 05/14/2014    Lab Results  Component Value Date   CREATININE 0.9 06/17/2015   CREATININE 1.41 08/14/2014   CREATININE 1.10 05/28/2014    Lab Results  Component Value Date   WBC 4.2 06/17/2015   HGB 14.6 06/17/2015   HCT 44.6 10/29/2013   PLT 176 06/17/2015   GLUCOSE 109* 08/14/2014   CHOL 238* 07/09/2015   TRIG 131 07/09/2015   HDL 58 07/09/2015   LDLDIRECT 144.0 08/06/2012   LDLCALC 154* 07/09/2015   ALT 34 07/09/2015   AST 26 07/09/2015   NA 137 06/17/2015   K 4.8 06/17/2015   CL 104 08/14/2014   CREATININE 0.9 06/17/2015   BUN 12 06/17/2015   CO2 29 08/14/2014   TSH 2.08 08/06/2012   PSA 2.38 07/09/2015   HGBA1C 5.4 07/09/2015   MICROALBUR 1.3 05/14/2014  US Abdomen Complete  06/19/2014  CLINICAL DATA:  Elevated liver function studies EXAM: ULTRASOUND ABDOMEN COMPLETE COMPARISON:  None. FINDINGS: Gallbladder: No gallstones or wall thickening visualized. No sonographic Murphy sign noted. Common bile duct: Diameter: 3.0 cm Liver: The hepatic echotexture is increased diffusely. There is no focal mass or ductal dilation. The surface contour is normal. IVC: No abnormality visualized. Pancreas: Evaluation of the pancreatic head and tail was limited by bowel gas. Spleen: Size and appearance within normal limits. Right Kidney: Length: 11.5 cm. Echogenicity within normal limits. No mass or hydronephrosis visualized. Left Kidney: Length: 11.1 cm. Echogenicity within normal limits. No mass or hydronephrosis visualized. Abdominal aorta: No aneurysm visualized. Other findings: There is no ascites. IMPRESSION: Increased hepatic echotexture consistent with fatty infiltrative change. There is no evidence of acute cholecystitis. Evaluation of the pancreas is limited by bowel gas. Electronically Signed   By: David  [place] M.D.   On: 06/19/2014 11:49    Assessment & Plan:   Problem List Items Addressed This Visit    HTN (hypertension)    Well controlled on  current regimen. Renal function stable, no changes today. Lab Results  Component Value Date   CREATININE 1.41 08/14/2014   Lab Results  Component Value Date   NA 138 08/14/2014   K 4.8 08/14/2014   CL 104 08/14/2014   CO2 29 08/14/2014           Elevated liver enzymes    Fatty liver was suggested by ultrasound, and he was referred to Dr Ardine Eng at [facility].  Results of biopsy reviewed on the website and suggestive of additional  Etiologies including passive congestion from heart failure.  Given his chronic insonia,  May have right sided heart failure from untreated OSA.  ECHO ordered . Raj Janus studies are normal.    Lab Results  Component Value Date   IRON 106 07/09/2015   TIBC 404 07/09/2015   FERRITIN 180 07/09/2015         Relevant Orders   Iron and TIBC (Completed)   Ferritin (Completed)   Hepatic function panel (Completed)   Impaired fasting glucose    He has reduced his a1c to 5.4 and is following a low glycemic index diet.    Lab Results  Component Value Date   HGBA1C 5.4 07/09/2015   Lab Results  Component Value Date   CHOL 238* 07/09/2015   HDL 58 07/09/2015   LDLCALC 154* 07/09/2015   LDLDIRECT 144.0 08/06/2012   TRIG 131 07/09/2015   CHOLHDL 4.1 07/09/2015                 Cognitive complaints    Prior MRi reviewed,.  He was screened  for metabolic issues but has not been screened for OSA yet which would be a good idea given concurrent  headaches and snoring.  Trial of wellbutrin for concurrent depression       Major depressive disorder, single episode    Mild, with fatigue and cognitive complaints including poor concentration.  Trial of well butrin.  RTC one month       Relevant Medications   buPROPion (WELLBUTRIN) 100 MG tablet    Other Visit Diagnoses    Chronic systolic congestive heart failure (HCC)    -  Primary    Relevant Orders    Ambulatory referral to Cardiology    Impaired fasting blood sugar        Relevant Orders     Hemoglobin A1c (Completed)    Hyperlipidemia  Relevant Orders    Lipid panel (Completed)    Prostate cancer screening        Relevant Orders    PSA (Completed)       I have discontinued Jeff [name] loperamide, bismuth subsalicylate, COSOPT PF, predniSONE, and HYDROcodone-acetaminophen. I am also having him start on buPROPion and predniSONE. Additionally, I am having him maintain his albuterol, tiZANidine, PRED FORTE, omeprazole, losartan, allopurinol, colchicine, traMADol, traZODone, metoprolol succinate, and timolol. We will continue to administer triamcinolone acetonide and lidocaine.  Meds ordered this encounter  Medications  . timolol (TIMOPTIC) 0.5 % ophthalmic solution    Sig: Place 5 mLs into the right eye daily. 1 drop per day  . buPROPion (WELLBUTRIN) 100 MG tablet    Sig: Take 1 tablet (100 mg total) by mouth 2 (two) times daily.    Dispense:  60 tablet    Refill:  2  . predniSONE (DELTASONE) 10 MG tablet    Sig: 6 tablets all at once on Day 1,  Then taper by 1 tablet daily until gone    Dispense:  21 tablet    Refill:  0    Medications Discontinued During This Encounter  Medication Reason  . bismuth subsalicylate (PEPTO BISMOL) 262 MG/15ML suspension   . COSOPT PF 22.3-6.8 MG/ML SOLN   . HYDROcodone-acetaminophen (NORCO) 10-325 MG tablet   . loperamide (IMODIUM A-D) 2 MG tablet   . predniSONE (DELTASONE) 10 MG tablet   A total of 40 minutes of face to face time was spent with patient more than half of which was spent in counselling and coordination of care   Follow-up: Return in about 4 weeks (around 08/06/2015).   Sherlene Shams, MD

## 2015-07-11 NOTE — Assessment & Plan Note (Addendum)
He has reduced his a1c to 5.4 and is following a low glycemic index diet.    Lab Results  Component Value Date   HGBA1C 5.4 07/09/2015   Lab Results  Component Value Date   CHOL 238* 07/09/2015   HDL 58 07/09/2015   LDLCALC 154* 07/09/2015   LDLDIRECT 144.0 08/06/2012   TRIG 131 07/09/2015   CHOLHDL 4.1 07/09/2015

## 2015-07-11 NOTE — Assessment & Plan Note (Signed)
Well controlled on current regimen. Renal function stable, no changes today. Lab Results  Component Value Date   CREATININE 1.41 08/14/2014   Lab Results  Component Value Date   NA 138 08/14/2014   K 4.8 08/14/2014   CL 104 08/14/2014   CO2 29 08/14/2014

## 2015-07-11 NOTE — Assessment & Plan Note (Signed)
Prior MRi reviewed,.  He was screened  for metabolic issues but has not been screened for OSA yet which would be a good idea given concurrent  headaches and snoring.  Trial of wellbutrin for concurrent depression

## 2015-07-11 NOTE — Assessment & Plan Note (Addendum)
Fatty liver was suggested by ultrasound, and he was referred to [facility].  Results of biopsy reviewed on the website and suggestive of additional  Etiologies including passive congestion from heart failure.  Given his chronic insonia,  May have right sided heart failure from untreated OSA.  ECHO ordered . Raj Janusrons studies are normal.    Lab Results  Component Value Date   IRON 106 07/09/2015   TIBC 404 07/09/2015   FERRITIN 180 07/09/2015

## 2015-07-11 NOTE — Assessment & Plan Note (Signed)
Mild, with fatigue and cognitive complaints including poor concentration.  Trial of well butrin.  RTC one month

## 2015-07-13 ENCOUNTER — Encounter: Payer: Self-pay | Admitting: Internal Medicine

## 2015-07-14 NOTE — Progress Notes (Signed)
Referring: Dr Darrick Huntsman  HPI: 55 year old male for evaluation of congestive heart failure. Patient has had elevated liver functions previously. He was referred to [facility] and ultimately had a biopsy by his report. He was told that one of the possible causes with congestive heart failure. He has not had any cardiac testing by his report. Records not available. He has dyspnea with more extreme activities but not routine activities. No orthopnea, PND, palpitations, syncope or exertional chest pain. Because of the above were asked to evaluate.  Current Outpatient Prescriptions  Medication Sig Dispense Refill  . albuterol (PROVENTIL HFA;VENTOLIN HFA) 108 (90 BASE) MCG/ACT inhaler Inhale 2 puffs into the lungs every 6 (six) hours as needed for wheezing or shortness of breath. 1 Inhaler 11  . allopurinol (ZYLOPRIM) 300 MG tablet TAKE 1/2 TABLETS (150 MG TOTAL) BY MOUTH DAILY. 90 tablet 0  . buPROPion (WELLBUTRIN) 100 MG tablet Take 1 tablet (100 mg total) by mouth 2 (two) times daily. 60 tablet 2  . colchicine 0.6 MG tablet Take 1 tablet (0.6 mg total) by mouth 2 (two) times daily. As needed for gout attack 30 tablet 0  . losartan (COZAAR) 100 MG tablet TAKE 1 TABLET (100 MG TOTAL) BY MOUTH DAILY. 90 tablet 0  . metoprolol succinate (TOPROL-XL) 25 MG 24 hr tablet TAKE 1 TABLET (25 MG TOTAL) BY MOUTH DAILY. 90 tablet 1  . omeprazole (PRILOSEC) 40 MG capsule Take 1 capsule (40 mg total) by mouth daily. 90 capsule 3  . PRED FORTE 1 % ophthalmic suspension   6  . predniSONE (DELTASONE) 10 MG tablet 6 tablets all at once on Day 1,  Then taper by 1 tablet daily until gone 21 tablet 0  . timolol (TIMOPTIC) 0.5 % ophthalmic solution Place 5 mLs into the right eye daily. 1 drop per day    . tiZANidine (ZANAFLEX) 4 MG tablet TAKE 1 TABLET (4 MG TOTAL) BY MOUTH AT BEDTIME. 90 tablet 1  . traMADol (ULTRAM) 50 MG tablet Take 1 tablet (50 mg total) by mouth every 6 (six) hours as needed. 90 tablet 0  . traZODone (DESYREL)  50 MG tablet Take 1 tablet (50 mg total) by mouth daily as needed for sleep. 90 tablet 1   Current Facility-Administered Medications  Medication Dose Route Frequency Provider Last Rate Last Dose  . lidocaine (XYLOCAINE) 1 % (with pres) injection 4 mL  4 mL Other Once Sherlene Shams, MD      . triamcinolone acetonide (KENALOG-40) injection 40 mg  40 mg Intra-articular Once Sherlene Shams, MD        Allergies  Allergen Reactions  . Keflex [Cephalexin] Rash     Past Medical History  Diagnosis Date  . Glaucoma (increased eye pressure)   . Headache(784.0)     07/11/13- none in a long time  . GERD (gastroesophageal reflux disease)     with certain foods  . Hypertension     no meds  . Claustrophobia   . Hepatic steatosis   . Hyperglycemia   . Arthritis     KNEES  . Cataract     REMOVED BILATERAL  . Sleep apnea   . Hyperlipidemia     Past Surgical History  Procedure Laterality Date  . Mini shunt insertion  03/09/2011    Procedure: INSERTION OF MINI SHUNT;  Surgeon: Chalmers Guest, MD;  Location: [facility] OR;  Service: Ophthalmology;  Laterality: Left;  . Finger surgery Right   . Eye surgery  2 on left eye  . Cataract extraction w/phaco Left 07/16/2013    Procedure: CATARACT EXTRACTION PHACO AND INTRAOCULAR LENS PLACEMENT (IOC) LEFT EYE;  Surgeon: Chalmers Guestoy Whitaker, MD;  [facility];  Service: Ophthalmology;  Laterality: Left;  . Cataract extraction w/phaco Right 10/29/2013    Procedure: CATARACT EXTRACTION PHACO AND INTRAOCULAR LENS PLACEMENT (IOC);  Surgeon: Chalmers Guestoy Whitaker, MD;  [facility];  Service: Ophthalmology;  Laterality: Right;    Social History   Social History  . Marital Status: Married    Spouse Name: N/A  . Number of Children: 0  . Years of Education: college3   Occupational History  . [employer]   Social History Main Topics  . Smoking status: Never Smoker   . Smokeless tobacco: Never Used  . Alcohol Use: 3.6 - 4.8 oz/week    6-8 Cans of beer per week      Comment: daily  . Drug Use: No  . Sexual Activity: Not on file   Other Topics Concern  . Not on file   Social History Narrative    Family History  Problem Relation Age of Onset  . Asthma Mother   . Hyperlipidemia Mother   . Heart disease Mother   . Hypertension Mother   . Liver cancer Mother   . Asthma Father   . Hyperlipidemia Father   . Heart disease Father   . Stroke Father   . Hypertension Father   . Aneurysm Father   . Asthma Paternal Aunt   . Heart disease Sister   . Early death Sister   . Aneurysm Brother     non smoker   . Early death Brother   . Migraines Neg Hx     ROS: no fevers or chills, productive cough, hemoptysis, dysphasia, odynophagia, melena, hematochezia, dysuria, hematuria, rash, seizure activity, orthopnea, PND, pedal edema, claudication. Remaining systems are negative.  Physical Exam:   Blood pressure 151/100, pulse 77, height 5\' 10"  (1.778 m), weight 192 lb 9.6 oz (87.363 kg).  General:  Well developed/well nourished in NAD Skin warm/dry Patient not depressed No peripheral clubbing Back-normal HEENT-normal/normal eyelids Neck supple/normal carotid upstroke bilaterally; no bruits; no JVD; no thyromegaly chest - CTA/ normal expansion CV - RRR/normal S1 and S2; no murmurs, rubs or gallops;  PMI nondisplaced Abdomen -NT/ND, no HSM, no mass, + bowel sounds, no bruit 2+ femoral pulses, no bruits Ext-no edema, chords, 2+ DP Neuro-grossly nonfocal  ECG Sinus rhythm at a rate of 77. Cannot rule out prior septal infarct.

## 2015-07-15 ENCOUNTER — Ambulatory Visit (INDEPENDENT_AMBULATORY_CARE_PROVIDER_SITE_OTHER): Payer: BLUE CROSS/BLUE SHIELD | Admitting: Cardiology

## 2015-07-15 ENCOUNTER — Encounter: Payer: Self-pay | Admitting: Cardiology

## 2015-07-15 VITALS — BP 151/100 | HR 77 | Ht 70.0 in | Wt 192.6 lb

## 2015-07-15 DIAGNOSIS — E785 Hyperlipidemia, unspecified: Secondary | ICD-10-CM | POA: Diagnosis not present

## 2015-07-15 DIAGNOSIS — R0600 Dyspnea, unspecified: Secondary | ICD-10-CM | POA: Diagnosis not present

## 2015-07-15 DIAGNOSIS — I5032 Chronic diastolic (congestive) heart failure: Secondary | ICD-10-CM | POA: Diagnosis not present

## 2015-07-15 DIAGNOSIS — I10 Essential (primary) hypertension: Secondary | ICD-10-CM

## 2015-07-15 DIAGNOSIS — I509 Heart failure, unspecified: Secondary | ICD-10-CM | POA: Insufficient documentation

## 2015-07-15 NOTE — Assessment & Plan Note (Signed)
Blood pressure controlled. Continue present medications. 

## 2015-07-15 NOTE — Assessment & Plan Note (Signed)
Patient care is the diagnosis of congestive heart failure but I have no prior studies to confirm. He is not volume overloaded on examination. Arrange echocardiogram to assess LV Systolic and diastolic function.

## 2015-07-15 NOTE — Assessment & Plan Note (Signed)
Management per primary care. 

## 2015-07-15 NOTE — Patient Instructions (Signed)
Medication Instructions:   NO CHANGES  Testing/Procedures:  Your physician has requested that you have an echocardiogram. Echocardiography is a painless test that uses sound waves to create images of your heart. It provides your doctor with information about the size and shape of your heart and how well your heart's chambers and valves are working. This procedure takes approximately one hour. There are no restrictions for this procedure.   Your physician has requested that you have an exercise tolerance test. For further information please visit [email or web address]. Please also follow instruction sheet, as given.  Follow-Up:  Your physician recommends that you schedule a follow-up appointment in: AS NEEDED PENDING TEST RESULTS   Exercise Stress Electrocardiogram An exercise stress electrocardiogram is a test to check how blood flows to your heart. It is done to find areas of poor blood flow. You will need to walk on a treadmill for this test. The electrocardiogram will record your heartbeat when you are at rest and when you are exercising. BEFORE THE PROCEDURE  Do not have drinks with caffeine or foods with caffeine for 24 hours before the test, or as told by your doctor. This includes coffee, tea (even decaf tea), sodas, chocolate, and cocoa.  Follow your doctor's instructions about eating and drinking before the test.  Ask your doctor what medicines you should or should not take before the test. Take your medicines with water unless told by your doctor not to.  If you use an inhaler, bring it with you to the test.  Bring a snack to eat after the test.  Do not  smoke for 4 hours before the test.  Do not put lotions, powders, creams, or oils on your chest before the test.  Wear comfortable shoes and clothing. PROCEDURE  You will have patches put on your chest. Small areas of your chest may need to be shaved. Wires will be connected to the patches.  Your heart rate will be watched  while you are resting and while you are exercising.  You will walk on the treadmill. The treadmill will slowly get faster to raise your heart rate.  The test will take about 1-2 hours. AFTER THE PROCEDURE  Your heart rate and blood pressure will be watched after the test.  You may return to your normal diet, activities, and medicines or as told by your doctor.   This information is not intended to replace advice given to you by your health care provider. Make sure you discuss any questions you have with your health care provider.   Document Released: 07/26/2007 Document Revised: 02/27/2014 Document Reviewed: 10/14/2012 Elsevier Interactive Patient Education [employer]2016 Elsevier Inc.

## 2015-07-15 NOTE — Assessment & Plan Note (Signed)
Patient with some dyspnea on exertion. Strong family history of coronary disease. Arrange exercise treadmill for risk stratification.

## 2015-07-20 ENCOUNTER — Telehealth (HOSPITAL_COMMUNITY): Payer: Self-pay

## 2015-07-20 NOTE — Telephone Encounter (Signed)
Encounter complete. 

## 2015-07-21 ENCOUNTER — Telehealth: Payer: Self-pay | Admitting: Cardiology

## 2015-07-21 NOTE — Telephone Encounter (Signed)
New message  Pt called back states that the VM that was left was thorough he is just requesting a call back to discuss his BP medication.

## 2015-07-21 NOTE — Telephone Encounter (Signed)
Returned call to patient no answer.LMTC. 

## 2015-07-22 ENCOUNTER — Ambulatory Visit (HOSPITAL_COMMUNITY)
Admission: RE | Admit: 2015-07-22 | Discharge: 2015-07-22 | Disposition: A | Discharge status: Home / Self Care | Payer: BLUE CROSS/BLUE SHIELD | Source: Ambulatory Visit | Attending: Cardiology | Admitting: Cardiology

## 2015-07-22 ENCOUNTER — Telehealth: Payer: Self-pay | Admitting: Cardiology

## 2015-07-22 DIAGNOSIS — R0600 Dyspnea, unspecified: Secondary | ICD-10-CM | POA: Insufficient documentation

## 2015-07-22 LAB — EXERCISE TOLERANCE TEST
CSEPED: 10 min
CSEPHR: 98 %
CSEPPHR: 164 {beats}/min
Estimated workload: 12.3 METS
Exercise duration (sec): 20 s
MPHR: 167 {beats}/min
RPE: 17
Rest HR: 74 {beats}/min

## 2015-07-22 NOTE — Telephone Encounter (Signed)
Pt c/o medication issue:  1. Name of Medication: lasartan  2. How are you currently taking this medication (dosage and times per day)? 100mg   3. Are you having a reaction (difficulty breathing--STAT)? no  4. What is your medication issue? Pt asking if he is allowed to take this meds while fasting

## 2015-07-22 NOTE — Telephone Encounter (Signed)
See updated encounter

## 2015-07-22 NOTE — Telephone Encounter (Signed)
Returned call to pt. Confirmed with him it was ok to take his losartan today as usual before the ETT. He verbalized understanding and knew not to take his metoprolol.

## 2015-07-23 ENCOUNTER — Encounter: Payer: Self-pay | Admitting: Internal Medicine

## 2015-08-03 ENCOUNTER — Other Ambulatory Visit: Payer: Self-pay

## 2015-08-03 ENCOUNTER — Ambulatory Visit (HOSPITAL_COMMUNITY): Payer: BLUE CROSS/BLUE SHIELD | Attending: Internal Medicine

## 2015-08-03 DIAGNOSIS — I110 Hypertensive heart disease with heart failure: Secondary | ICD-10-CM | POA: Diagnosis not present

## 2015-08-03 DIAGNOSIS — I509 Heart failure, unspecified: Secondary | ICD-10-CM | POA: Diagnosis not present

## 2015-08-03 DIAGNOSIS — I340 Nonrheumatic mitral (valve) insufficiency: Secondary | ICD-10-CM | POA: Insufficient documentation

## 2015-08-03 DIAGNOSIS — E785 Hyperlipidemia, unspecified: Secondary | ICD-10-CM | POA: Insufficient documentation

## 2015-08-03 DIAGNOSIS — R0600 Dyspnea, unspecified: Secondary | ICD-10-CM

## 2015-08-03 LAB — ECHOCARDIOGRAM COMPLETE
AOASC: 30 cm
AVLVOTPG: 10 mmHg
AVPHT: 347 ms
CHL CUP DOP CALC LVOT VTI: 31 cm
CHL CUP MV DEC (S): 285
EERAT: 6.28
EWDT: 285 ms
FS: 35 % (ref 28–44)
IV/PV OW: 1.07
LA diam end sys: 40 mm
LA vol A4C: 36.4 ml
LA vol: 55.6 mL
LADIAMINDEX: 1.91 cm/m2
LASIZE: 40 mm
LAVOLIN: 26.6 mL/m2
LV E/e' medial: 6.28
LV E/e'average: 6.28
LV PW d: 9.21 mm — AB (ref 0.6–1.1)
LV TDI E'LATERAL: 12.9
LVELAT: 12.9 cm/s
LVOT SV: 97 mL
LVOT area: 3.14 cm2
LVOT diameter: 20 mm
LVOTPV: 160 cm/s
Lateral S' vel: 12.8 cm/s
MV Peak grad: 3 mmHg
MVPKAVEL: 76.6 m/s
MVPKEVEL: 81 m/s
TAPSE: 30.4 mm
TDI e' medial: 9.68

## 2015-08-13 ENCOUNTER — Encounter: Payer: Self-pay | Admitting: Internal Medicine

## 2015-08-13 ENCOUNTER — Ambulatory Visit (INDEPENDENT_AMBULATORY_CARE_PROVIDER_SITE_OTHER): Payer: BLUE CROSS/BLUE SHIELD | Admitting: Internal Medicine

## 2015-08-13 VITALS — BP 122/80 | HR 71 | Temp 98.2°F | Resp 12 | Ht 70.0 in | Wt 189.8 lb

## 2015-08-13 DIAGNOSIS — F320 Major depressive disorder, single episode, mild: Secondary | ICD-10-CM

## 2015-08-13 DIAGNOSIS — F1010 Alcohol abuse, uncomplicated: Secondary | ICD-10-CM

## 2015-08-13 DIAGNOSIS — R419 Unspecified symptoms and signs involving cognitive functions and awareness: Secondary | ICD-10-CM

## 2015-08-13 DIAGNOSIS — K760 Fatty (change of) liver, not elsewhere classified: Secondary | ICD-10-CM

## 2015-08-13 DIAGNOSIS — I10 Essential (primary) hypertension: Secondary | ICD-10-CM

## 2015-08-13 DIAGNOSIS — N401 Enlarged prostate with lower urinary tract symptoms: Secondary | ICD-10-CM

## 2015-08-13 DIAGNOSIS — Z0000 Encounter for general adult medical examination without abnormal findings: Secondary | ICD-10-CM

## 2015-08-13 DIAGNOSIS — G4700 Insomnia, unspecified: Secondary | ICD-10-CM

## 2015-08-13 DIAGNOSIS — R3914 Feeling of incomplete bladder emptying: Secondary | ICD-10-CM

## 2015-08-13 MED ORDER — LOSARTAN POTASSIUM-HCTZ 100-25 MG PO TABS: 1.0000 | ORAL_TABLET | Freq: Every day | ORAL | Status: DC

## 2015-08-13 MED ORDER — TADALAFIL 5 MG PO TABS: 5.0000 mg | ORAL_TABLET | Freq: Every day | ORAL | Status: DC

## 2015-08-13 MED ORDER — CLONAZEPAM 0.5 MG PO TABS: 0.5000 mg | ORAL_TABLET | Freq: Every day | ORAL | Status: DC

## 2015-08-13 NOTE — Progress Notes (Signed)
Patient ID: Jeff [name], male    DOB: 12/09/1960  Age: 55 y.o. MRN: [id number]  [name] here for annual physical examination and management of other chronic and acute problems.   The risk factors are reflected in the social history.  The roster of all physicians providing medical care to patient - is listed in the Snapshot section of the chart.   Home safety : The patient has smoke detectors in the home. They wear seatbelts.  There are no firearms at home. There is no violence in the home.   There is no risks for hepatitis, STDs or HIV. There is no   history of blood transfusion. They have no travel history to infectious disease endemic areas of the world.  The patient has seen their dentist in the last six month. They have seen their eye doctor in the last year. They admit to slight hearing difficulty with regard to whispered voices and some television programs.  They have deferred audiologic testing in the last year.  They do not  have excessive sun exposure. Discussed the need for sun protection: hats, long sleeves and use of sunscreen if there is significant sun exposure.   Diet: the importance of a healthy diet is discussed. They do have a healthy diet.  The benefits of regular aerobic exercise were discussed. he walks 4 times per week ,  20 minutes.   Depression screen: there are  signs or vegative symptoms of depression- irritability, change in appetite, anhedonia, sadness/tearfullness.  The following portions of the patient's history were reviewed and updated as appropriate: allergies, current medications, past family history, past medical history,  past surgical history, past social history  and problem list.  Visual acuity was not assessed per patient preference since she has regular follow up with her ophthalmologist. Hearing and body mass index were assessed and reviewed.   During the course of the visit the patient was educated and counseled about appropriate  screening and preventive services including : fall prevention , diabetes screening, nutrition counseling, colorectal cancer screening, and recommended immunizations.    CC: The primary encounter diagnosis was Benign prostatic hypertrophy (BPH) with incomplete bladder emptying. Diagnoses of Major depressive disorder, single episode, mild (HCC), Cognitive complaints, Hepatic steatosis, Essential hypertension, Insomnia, Alcohol abuse, and Encounter for preventive health examination were also pertinent to this visit.   Feeling better on the wellbutrin but still having trouble concentrating and staying focused, trouble finishing tasks. Feels this is chronic;  no prior testing for ADD .wants to be tested   Has started drinking again despite fatty liver diagnosis and recommendations to quit.Marland Kitchen.  He has reduced  alcohol consumption to 2-3 glasses of wine which he reports is less than he used to drink (Used to drink 6-7 beers per night).  .  Still snoring,  Trouble staying asleep,  Gets more agitated the more he wakes up .  Did not try increasing dose of trazodone  to 100 mg daily   AUA score 18  cialis discussed   NEEDS REFERALL FOR ADD TESTING    History [name] has a past medical history of Glaucoma (increased eye pressure); Headache(784.0); GERD (gastroesophageal reflux disease); Hypertension; Claustrophobia; Hepatic steatosis; Hyperglycemia; Arthritis; Cataract; Sleep apnea; and Hyperlipidemia.   He has past surgical history that includes Mini shunt insertion (03/09/2011); Finger surgery (Right); Eye surgery; Cataract extraction w/PHACO (Left, 07/16/2013); and Cataract extraction w/PHACO (Right, 10/29/2013).   His family history includes Aneurysm in his brother and father; Asthma in his  father, mother, and paternal aunt; Early death in his brother and sister; Heart disease in his father, mother, and sister; Hyperlipidemia in his father and mother; Hypertension in his father and mother; Liver cancer in his  mother; Stroke in his father. There is no history of Migraines.He reports that he has never smoked. He has never used smokeless tobacco. He reports that he drinks about 3.6 - 4.8 oz of alcohol per week. He reports that he does not use illicit drugs.  Outpatient Prescriptions Prior to Visit  Medication Sig Dispense Refill  . albuterol (PROVENTIL HFA;VENTOLIN HFA) 108 (90 BASE) MCG/ACT inhaler Inhale 2 puffs into the lungs every 6 (six) hours as needed for wheezing or shortness of breath. 1 Inhaler 11  . allopurinol (ZYLOPRIM) 300 MG tablet TAKE 1/2 TABLETS (150 MG TOTAL) BY MOUTH DAILY. 90 tablet 0  . buPROPion (WELLBUTRIN) 100 MG tablet Take 1 tablet (100 mg total) by mouth 2 (two) times daily. 60 tablet 2  . colchicine 0.6 MG tablet Take 1 tablet (0.6 mg total) by mouth 2 (two) times daily. As needed for gout attack 30 tablet 0  . metoprolol succinate (TOPROL-XL) 25 MG 24 hr tablet TAKE 1 TABLET (25 MG TOTAL) BY MOUTH DAILY. 90 tablet 1  . omeprazole (PRILOSEC) 40 MG capsule Take 1 capsule (40 mg total) by mouth daily. 90 capsule 3  . PRED FORTE 1 % ophthalmic suspension   6  . predniSONE (DELTASONE) 10 MG tablet 6 tablets all at once on Day 1,  Then taper by 1 tablet daily until gone 21 tablet 0  . tiZANidine (ZANAFLEX) 4 MG tablet TAKE 1 TABLET (4 MG TOTAL) BY MOUTH AT BEDTIME. 90 tablet 1  . traMADol (ULTRAM) 50 MG tablet Take 1 tablet (50 mg total) by mouth every 6 (six) hours as needed. 90 tablet 0  . traZODone (DESYREL) 50 MG tablet Take 1 tablet (50 mg total) by mouth daily as needed for sleep. 90 tablet 1  . losartan (COZAAR) 100 MG tablet TAKE 1 TABLET (100 MG TOTAL) BY MOUTH DAILY. 90 tablet 0  . timolol (TIMOPTIC) 0.5 % ophthalmic solution Place 5 mLs into the right eye daily. 1 drop per day     Facility-Administered Medications Prior to Visit  Medication Dose Route Frequency Provider Last Rate Last Dose  . lidocaine (XYLOCAINE) 1 % (with pres) injection 4 mL  4 mL Other Once Sherlene Shams, MD      . triamcinolone acetonide (KENALOG-40) injection 40 mg  40 mg Intra-articular Once Sherlene Shams, MD        Review of Systems   Patient denies headache, fevers, malaise, unintentional weight loss, skin rash, eye pain, sinus congestion and sinus pain, sore throat, dysphagia,  hemoptysis , cough, dyspnea, wheezing, chest pain, palpitations, orthopnea, edema, abdominal pain, nausea, melena, diarrhea, constipation, flank pain, dysuria, hematuria, urinary  Frequency, nocturia, numbness, tingling, seizures,  Focal weakness, Loss of consciousness,  Tremor, insomnia, depression, anxiety, and suicidal ideation.      Objective:  BP 122/80 mmHg  Pulse 71  Temp(Src) 98.2 F (36.8 C) (Oral)  Resp 12  Ht 5\' 10"  (1.778 m)  Wt 189 lb 12 oz (86.07 kg)  BMI 27.23 kg/m2  SpO2 96%  Physical Exam   General appearance: alert, cooperative and appears stated age Ears: normal TM's and external ear canals both ears Throat: lips, mucosa, and tongue normal; teeth and gums normal Neck: no adenopathy, no carotid bruit, supple, symmetrical, trachea midline and  thyroid not enlarged, symmetric, no tenderness/mass/nodules Back: symmetric, no curvature. ROM normal. No CVA tenderness. Lungs: clear to auscultation bilaterally Heart: regular rate and rhythm, S1, S2 normal, no murmur, click, rub or gallop Abdomen: soft, non-tender; bowel sounds normal; no masses,  no organomegaly Pulses: 2+ and symmetric Skin: Skin color, texture, turgor normal. No rashes or lesions Lymph nodes: Cervical, supraclavicular, and axillary nodes normal.    Assessment & Plan:   Problem List Items Addressed This Visit    HTN (hypertension)    Not well controlled.  Adding hctz 25 mg daily .  Lab Results  Component Value Date   CREATININE 0.9 06/17/2015   Lab Results  Component Value Date   NA 137 06/17/2015   K 4.8 06/17/2015   CL 104 08/14/2014   CO2 29 08/14/2014         Relevant Medications    losartan-hydrochlorothiazide (HYZAAR) 100-25 MG tablet   tadalafil (CIALIS) 5 MG tablet   Encounter for preventive health examination    Annual comprehensive preventive exam was done as well as an evaluation and management of acute and chronic conditions .  During the course of the visit the patient was educated and counseled about appropriate screening and preventive services including :  diabetes screening, lipid analysis with projected  10 year  risk for CAD , nutrition counseling, prostate and colorectal cancer screening, and recommended immunizations.  Printed recommendations for health maintenance screenings was given.   Lab Results  Component Value Date   PSA 2.38 07/09/2015   PSA 1.66 04/24/2014   PSA 1.24 09/03/2012   Lab Results  Component Value Date   CHOL 238* 07/09/2015   HDL 58 07/09/2015   LDLCALC 154* 07/09/2015   LDLDIRECT 144.0 08/06/2012   TRIG 131 07/09/2015   CHOLHDL 4.1 07/09/2015         Insomnia    Advised to increase trazodone dose to 100 mg one hour before bedtime       Hepatic steatosis    Advised to sustain from alcohol. Hep A & B vaccines offered       Cognitive complaints    He continues to report difficulty staying on task and concentrating .  Referral for cognitive testing to fle out ADD       Relevant Orders   Ambulatory referral to Psychology   Major depressive disorder, single episode    Improving with wellbutrin .  No changes today       Benign prostatic hypertrophy (BPH) with incomplete bladder emptying - Primary    AUA score of 18,  cialis trial discussed       Alcohol abuse    He uses alcohol to "unwind" after work.  Agreed to a trial of clonazepam to use once daily around 5 pm to help him relax.  The risks and benefits of benzodiazepine use were discussed with patient today including excessive sedation leading to respiratory depression,  impaired thinking/driving, and addiction.  Patient was advised to avoid concurrent use with  alcohol, to use medication only as needed and not to share with others  .          I have discontinued Mr. Neva SeatKime's losartan and timolol. I am also having him start on clonazePAM, losartan-hydrochlorothiazide, and tadalafil. Additionally, I am having him maintain his albuterol, tiZANidine, PRED FORTE, omeprazole, allopurinol, colchicine, traMADol, traZODone, metoprolol succinate, buPROPion, predniSONE, and dorzolamide-timolol. We will continue to administer triamcinolone acetonide and lidocaine.  Meds ordered this encounter  Medications  . dorzolamide-timolol (  COSOPT) 22.3-6.8 MG/ML ophthalmic solution    Sig: Place 1 drop into the right eye 2 (two) times daily.    Refill:  11  . clonazePAM (KLONOPIN) 0.5 MG tablet    Sig: Take 1 tablet (0.5 mg total) by mouth daily.    Dispense:  30 tablet    Refill:  1  . losartan-hydrochlorothiazide (HYZAAR) 100-25 MG tablet    Sig: Take 1 tablet by mouth daily.    Dispense:  30 tablet    Refill:  1  . tadalafil (CIALIS) 5 MG tablet    Sig: Take 1 tablet (5 mg total) by mouth daily.    Dispense:  30 tablet    Refill:  5    For BPH    Medications Discontinued During This Encounter  Medication Reason  . timolol (TIMOPTIC) 0.5 % ophthalmic solution Change in therapy  . losartan (COZAAR) 100 MG tablet     Follow-up: No Follow-up on file.   Sherlene Shams, MD

## 2015-08-13 NOTE — Patient Instructions (Addendum)
Try increasing the trazodone to 100 mg daily , one hour before you want to be asleep  I am adding clonazepam to take in the afternoon when you get home from work to help you relax   DO NOT DRINK alcohol  If you are going to take the clonazepam  I am changing your losartan to losartan HCT   100/25 to improve your blood pressure.  Continue Metoprolol as well  Get Your BP checked after a week for  the new med .  Goal is 130/80 .  Let me know if not at goal   Cialis daily for prostate symtoms    Health Maintenance, Male A healthy lifestyle and preventative care can promote health and wellness.  Maintain regular health, dental, and eye exams.  Eat a healthy diet. Foods like vegetables, fruits, whole grains, low-fat dairy products, and lean protein foods contain the nutrients you need and are low in calories. Decrease your intake of foods high in solid fats, added sugars, and salt. Get information about a proper diet from your health care provider, if necessary.  Regular physical exercise is one of the most important things you can do for your health. Most adults should get at least 150 minutes of moderate-intensity exercise (any activity that increases your heart rate and causes you to sweat) each week. In addition, most adults need muscle-strengthening exercises on 2 or more days a week.   Maintain a healthy weight. The body mass index (BMI) is a screening tool to identify possible weight problems. It provides an estimate of body fat based on height and weight. Your health care provider can find your BMI and can help you achieve or maintain a healthy weight. For males 20 years and older:  A BMI below 18.5 is considered underweight.  A BMI of 18.5 to 24.9 is normal.  A BMI of 25 to 29.9 is considered overweight.  A BMI of 30 and above is considered obese.  Maintain normal blood lipids and cholesterol by exercising and minimizing your intake of saturated fat. Eat a balanced diet with plenty  of fruits and vegetables. Blood tests for lipids and cholesterol should begin at age 53 and be repeated every 5 years. If your lipid or cholesterol levels are high, you are over age 53, or you are at high risk for heart disease, you may need your cholesterol levels checked more frequently.Ongoing high lipid and cholesterol levels should be treated with medicines if diet and exercise are not working.  If you smoke, find out from your health care provider how to quit. If you do not use tobacco, do not start.  Lung cancer screening is recommended for adults aged 55-80 years who are at high risk for developing lung cancer because of a history of smoking. A yearly low-dose CT scan of the lungs is recommended for people who have at least a 30-pack-year history of smoking and are current smokers or have quit within the past 15 years. A pack year of smoking is smoking an average of 1 pack of cigarettes a day for 1 year (for example, a 30-pack-year history of smoking could mean smoking 1 pack a day for 30 years or 2 packs a day for 15 years). Yearly screening should continue until the smoker has stopped smoking for at least 15 years. Yearly screening should be stopped for people who develop a health problem that would prevent them from having lung cancer treatment.  If you choose to drink alcohol, do not have  more than 2 drinks per day. One drink is considered to be 12 oz (360 mL) of beer, 5 oz (150 mL) of wine, or 1.5 oz (45 mL) of liquor.  Avoid the use of street drugs. Do not share needles with anyone. Ask for help if you need support or instructions about stopping the use of drugs.  High blood pressure causes heart disease and increases the risk of stroke. High blood pressure is more likely to develop in:  People who have blood pressure in the end of the normal range (100-139/85-89 mm Hg).  People who are overweight or obese.  People who are African American.  If you are 9118-53 years of age, have your  blood pressure checked every 3-5 years. If you are 53 years of age or older, have your blood pressure checked every year. You should have your blood pressure measured twice--once when you are at a hospital or clinic, and once when you are not at a hospital or clinic. Record the average of the two measurements. To check your blood pressure when you are not at a hospital or clinic, you can use:  An automated blood pressure machine at a pharmacy.  A home blood pressure monitor.  If you are 4945-53 years old, ask your health care provider if you should take aspirin to prevent heart disease.  Diabetes screening involves taking a blood sample to check your fasting blood sugar level. This should be done once every 3 years after age 53 if you are at a normal weight and without risk factors for diabetes. Testing should be considered at a younger age or be carried out more frequently if you are overweight and have at least 1 risk factor for diabetes.  Colorectal cancer can be detected and often prevented. Most routine colorectal cancer screening begins at the age of 53 and continues through age 53. However, your health care provider may recommend screening at an earlier age if you have risk factors for colon cancer. On a yearly basis, your health care provider may provide home test kits to check for hidden blood in the stool. A small camera at the end of a tube may be used to directly examine the colon (sigmoidoscopy or colonoscopy) to detect the earliest forms of colorectal cancer. Talk to your health care provider about this at age 53 when routine screening begins. A direct exam of the colon should be repeated every 5-10 years through age 53, unless early forms of precancerous polyps or small growths are found.  People who are at an increased risk for hepatitis B should be screened for this virus. You are considered at high risk for hepatitis B if:  You were born in a country where hepatitis B occurs often. Talk  with your health care provider about which countries are considered high risk.  Your parents were born in a high-risk country and you have not received a shot to protect against hepatitis B (hepatitis B vaccine).  You have HIV or AIDS.  You use needles to inject street drugs.  You live with, or have sex with, someone who has hepatitis B.  You are a man who has sex with other men (MSM).  You get hemodialysis treatment.  You take certain medicines for conditions like cancer, organ transplantation, and autoimmune conditions.  Hepatitis C blood testing is recommended for all people born from [id number] through 1963 and any individual with known risk factors for hepatitis C.  Healthy men should no longer receive prostate-specific antigen (  PSA) blood tests as part of routine cancer screening. Talk to your health care provider about prostate cancer screening.  Testicular cancer screening is not recommended for adolescents or adult males who have no symptoms. Screening includes self-exam, a health care provider exam, and other screening tests. Consult with your health care provider about any symptoms you have or any concerns you have about testicular cancer.  Practice safe sex. Use condoms and avoid high-risk sexual practices to reduce the spread of sexually transmitted infections (STIs).  You should be screened for STIs, including gonorrhea and chlamydia if:  You are sexually active and are younger than 24 years.  You are older than 24 years, and your health care provider tells you that you are at risk for this type of infection.  Your sexual activity has changed since you were last screened, and you are at an increased risk for chlamydia or gonorrhea. Ask your health care provider if you are at risk.  If you are at risk of being infected with HIV, it is recommended that you take a prescription medicine daily to prevent HIV infection. This is called pre-exposure prophylaxis (PrEP). You are  considered at risk if:  You are a man who has sex with other men (MSM).  You are a heterosexual man who is sexually active with multiple partners.  You take drugs by injection.  You are sexually active with a partner who has HIV.  Talk with your health care provider about whether you are at high risk of being infected with HIV. If you choose to begin PrEP, you should first be tested for HIV. You should then be tested every 3 months for as long as you are taking PrEP.  Use sunscreen. Apply sunscreen liberally and repeatedly throughout the day. You should seek shade when your shadow is shorter than you. Protect yourself by wearing long sleeves, pants, a wide-brimmed hat, and sunglasses year round whenever you are outdoors.  Tell your health care provider of new moles or changes in moles, especially if there is a change in shape or color. Also, tell your health care provider if a mole is larger than the size of a pencil eraser.  A one-time screening for abdominal aortic aneurysm (AAA) and surgical repair of large AAAs by ultrasound is recommended for men aged 65-75 years who are current or former smokers.  Stay current with your vaccines (immunizations).   This information is not intended to replace advice given to you by your health care provider. Make sure you discuss any questions you have with your health care provider.   Document Released: 08/05/2007 Document Revised: 02/27/2014 Document Reviewed: 07/04/2010 Elsevier Interactive Patient Education [employer]

## 2015-08-13 NOTE — Progress Notes (Signed)
Pre-visit discussion using our clinic review tool. No additional management support is needed unless otherwise documented below in the visit note.  

## 2015-08-15 DIAGNOSIS — F1010 Alcohol abuse, uncomplicated: Secondary | ICD-10-CM | POA: Insufficient documentation

## 2015-08-15 DIAGNOSIS — F1011 Alcohol abuse, in remission: Secondary | ICD-10-CM | POA: Insufficient documentation

## 2015-08-15 NOTE — Assessment & Plan Note (Signed)
Advised to sustain from alcohol. Hep A & B vaccines offered

## 2015-08-15 NOTE — Assessment & Plan Note (Signed)
Not well controlled.  Adding hctz 25 mg daily .  Lab Results  Component Value Date   CREATININE 0.9 06/17/2015   Lab Results  Component Value Date   NA 137 06/17/2015   K 4.8 06/17/2015   CL 104 08/14/2014   CO2 29 08/14/2014

## 2015-08-15 NOTE — Assessment & Plan Note (Signed)
Improving with wellbutrin .  No changes today

## 2015-08-15 NOTE — Assessment & Plan Note (Signed)
He continues to report difficulty staying on task and concentrating .  Referral for cognitive testing to fle out ADD

## 2015-08-15 NOTE — Assessment & Plan Note (Signed)
Advised to increase trazodone dose to 100 mg one hour before bedtime

## 2015-08-15 NOTE — Assessment & Plan Note (Signed)
He uses alcohol to "unwind" after work.  Agreed to a trial of clonazepam to use once daily around 5 pm to help him relax.  The risks and benefits of benzodiazepine use were discussed with patient today including excessive sedation leading to respiratory depression,  impaired thinking/driving, and addiction.  Patient was advised to avoid concurrent use with alcohol, to use medication only as needed and not to share with others  .

## 2015-08-15 NOTE — Assessment & Plan Note (Addendum)
Annual comprehensive preventive exam was done as well as an evaluation and management of acute and chronic conditions .  During the course of the visit the patient was educated and counseled about appropriate screening and preventive services including :  diabetes screening, lipid analysis with projected  10 year  risk for CAD , nutrition counseling, prostate and colorectal cancer screening, and recommended immunizations.  Printed recommendations for health maintenance screenings was given.   Lab Results  Component Value Date   PSA 2.38 07/09/2015   PSA 1.66 04/24/2014   PSA 1.24 09/03/2012   Lab Results  Component Value Date   CHOL 238* 07/09/2015   HDL 58 07/09/2015   LDLCALC 154* 07/09/2015   LDLDIRECT 144.0 08/06/2012   TRIG 131 07/09/2015   CHOLHDL 4.1 07/09/2015

## 2015-08-15 NOTE — Assessment & Plan Note (Signed)
AUA score of 18,  cialis trial discussed

## 2015-08-23 ENCOUNTER — Other Ambulatory Visit: Payer: Self-pay | Admitting: Internal Medicine

## 2015-08-23 NOTE — Telephone Encounter (Signed)
Tramadol was last refilled on 4/10 with #90, with no refills, please advise  Cialis was prescribed on 6/23, for BPH, cost is too high, please advise an alternative? thanks

## 2015-08-23 NOTE — Telephone Encounter (Signed)
Pt called about needing a refill for traMADol (ULTRAM) 50 MG tablet. Dr Darrick Huntsmanullo also prescribed tadalafil (CIALIS) 5 MG tablet and was told to see if he can coupons due to the medication being expensive or a less expensive brand?  Pharmacy is [id number] - [place] - [address]  Call pt @ [phone]. Thank you!

## 2015-08-24 ENCOUNTER — Other Ambulatory Visit: Payer: Self-pay | Admitting: Internal Medicine

## 2015-08-24 MED ORDER — TRAMADOL HCL 50 MG PO TABS: 50.0000 mg | ORAL_TABLET | Freq: Four times a day (QID) | ORAL | Status: DC | PRN

## 2015-08-24 MED ORDER — TAMSULOSIN HCL 0.4 MG PO CAPS: 0.4000 mg | ORAL_CAPSULE | Freq: Every day | ORAL | Status: DC

## 2015-08-24 NOTE — Telephone Encounter (Signed)
Tramadol refill authorized and medication for BPH changed to flomax.

## 2015-08-25 NOTE — Telephone Encounter (Signed)
Attempted to notify patient, left a detailed VM. thanks

## 2015-09-28 ENCOUNTER — Other Ambulatory Visit: Payer: Self-pay

## 2015-09-28 MED ORDER — LOSARTAN POTASSIUM-HCTZ 100-25 MG PO TABS: 1.0000 | ORAL_TABLET | Freq: Every day | ORAL | 1 refills | Status: DC

## 2015-09-28 MED ORDER — BUPROPION HCL 100 MG PO TABS: 100.0000 mg | ORAL_TABLET | Freq: Two times a day (BID) | ORAL | 1 refills | Status: DC

## 2015-10-08 ENCOUNTER — Ambulatory Visit: Payer: BLUE CROSS/BLUE SHIELD | Admitting: Internal Medicine

## 2015-10-27 ENCOUNTER — Other Ambulatory Visit: Payer: Self-pay | Admitting: Internal Medicine

## 2015-10-27 NOTE — Telephone Encounter (Signed)
Last filled 08/25/15. Last seen 08/13/15.

## 2015-10-28 NOTE — Telephone Encounter (Signed)
Pleases schedule pt an appointment in December. Thank you.

## 2015-10-28 NOTE — Telephone Encounter (Signed)
Refilled. Needs appt in December please schedule

## 2015-10-29 ENCOUNTER — Other Ambulatory Visit: Payer: Self-pay | Admitting: Internal Medicine

## 2015-10-29 ENCOUNTER — Telehealth: Payer: Self-pay | Admitting: *Deleted

## 2015-10-29 NOTE — Telephone Encounter (Signed)
Pt has requested a medication refill for tramadol  [facility] [place]

## 2015-10-29 NOTE — Telephone Encounter (Signed)
Called pt and lm on vm to call office and schedule a December appt for medication refill.

## 2015-10-29 NOTE — Telephone Encounter (Signed)
Medication has has already been taken care of. Sent to pharmacy [id number]

## 2015-11-01 ENCOUNTER — Ambulatory Visit: Payer: BLUE CROSS/BLUE SHIELD | Admitting: Primary Care

## 2015-11-01 ENCOUNTER — Encounter: Payer: Self-pay | Admitting: Family Medicine

## 2015-11-01 ENCOUNTER — Ambulatory Visit (INDEPENDENT_AMBULATORY_CARE_PROVIDER_SITE_OTHER): Payer: BLUE CROSS/BLUE SHIELD | Admitting: Family Medicine

## 2015-11-01 DIAGNOSIS — Z8739 Personal history of other diseases of the musculoskeletal system and connective tissue: Secondary | ICD-10-CM | POA: Diagnosis not present

## 2015-11-01 MED ORDER — COLCHICINE 0.6 MG PO TABS: ORAL_TABLET | ORAL | 0 refills | Status: DC

## 2015-11-01 MED ORDER — HYDROCODONE-ACETAMINOPHEN 5-325 MG PO TABS: 1.0000 | ORAL_TABLET | Freq: Four times a day (QID) | ORAL | 0 refills | Status: DC | PRN

## 2015-11-01 MED ORDER — PREDNISONE 20 MG PO TABS: ORAL_TABLET | ORAL | 0 refills | Status: DC

## 2015-11-01 NOTE — Progress Notes (Signed)
Pre visit review using our clinic review tool, if applicable. No additional management support is needed unless otherwise documented below in the visit note. 

## 2015-11-01 NOTE — Patient Instructions (Signed)
Colchicine for now, add on prednisone with food.  Don't take ibuprofen or aleve with prednisone.  Out of work for now.   Take vicodin if needed for pain.  Take care.  Glad to see you.

## 2015-11-01 NOTE — Progress Notes (Signed)
L elbow.  Started about 2 days ago.  On allopurinol at baseline.  H/o gout.  No fevers.  Some nausea.  Sig pain with attempted extension and flexion.  Has tried colchicine for the last few days.  H/o gout flares this bad prev.  Has had flares in the knees prev.    Meds, vitals, and allergies reviewed.   ROS: Per HPI unless specifically indicated in ROS section   nad rrr ctab Normal range of motion left shoulder. L elbow both the red warm and tender. Pain with extremes of extension and flexion. The olecranon bursa itself is nontender. His right wrist is slightly warm and reddish, but distally he is neurovascularly intact otherwise. The forearm is not tender and has no erythema. The upper arm is not tender or erythematous.

## 2015-11-02 NOTE — Assessment & Plan Note (Signed)
Likely a flare, likely at the elbow, possible at the wrist. Not likely to be a septic joint, given he has no fever. If he had a septic joint I would expect him to look worse. At this point start colchicine hourly until GI upset or relief. Discussed with patient. Can take prednisone in the meantime, with routine taper and routine GI cautions and steroid cautions. Prescription written for hydrocodone in case of refractory pain. Gout handout given to patient by staff. Okay for outpatient follow-up. Follow-up when necessary. Routed to PCP as FYI.

## 2015-11-15 ENCOUNTER — Other Ambulatory Visit: Payer: Self-pay

## 2015-11-15 MED ORDER — TAMSULOSIN HCL 0.4 MG PO CAPS: 0.4000 mg | ORAL_CAPSULE | Freq: Every day | ORAL | 3 refills | Status: DC

## 2015-12-20 ENCOUNTER — Other Ambulatory Visit: Payer: Self-pay

## 2015-12-20 MED ORDER — TAMSULOSIN HCL 0.4 MG PO CAPS: 0.4000 mg | ORAL_CAPSULE | Freq: Every day | ORAL | 3 refills | Status: DC

## 2016-01-07 ENCOUNTER — Ambulatory Visit: Payer: BLUE CROSS/BLUE SHIELD | Admitting: Internal Medicine

## 2016-02-08 ENCOUNTER — Telehealth: Payer: Self-pay | Admitting: *Deleted

## 2016-02-08 DIAGNOSIS — R1314 Dysphagia, pharyngoesophageal phase: Secondary | ICD-10-CM

## 2016-02-08 DIAGNOSIS — K219 Gastro-esophageal reflux disease without esophagitis: Secondary | ICD-10-CM

## 2016-02-08 MED ORDER — OMEPRAZOLE 40 MG PO CPDR: 40.0000 mg | DELAYED_RELEASE_CAPSULE | Freq: Every day | ORAL | 3 refills | Status: DC

## 2016-02-08 MED ORDER — TRAMADOL HCL 50 MG PO TABS: 50.0000 mg | ORAL_TABLET | Freq: Four times a day (QID) | ORAL | 0 refills | Status: DC | PRN

## 2016-02-08 NOTE — Telephone Encounter (Signed)
Patient has an appointment scheduled for 03/10/16

## 2016-02-08 NOTE — Telephone Encounter (Signed)
[facility]4  [facility]acy [facility] [place]

## 2016-02-08 NOTE — Telephone Encounter (Signed)
rx tramadol 50 mg sent to pharmacy

## 2016-02-08 NOTE — Telephone Encounter (Signed)
Yes, but on tramadol Refill for 30 days only.  Has not seen me since June .OFFICE VISIT NEEDED prior to any more refills

## 2016-02-08 NOTE — Telephone Encounter (Signed)
Patient did not request refill for tylenol #4 patient requested refill for omeprazole. Patient also requested refill on Tramadol last OV was 6/17 and last refill for Tramadol was for 90 with 2 refill , dated 10/28/15. The omeprazole is historical can I feel?

## 2016-02-08 NOTE — Telephone Encounter (Signed)
Left message for patient to return my call due to no record of PCP filling this medication.

## 2016-02-15 ENCOUNTER — Ambulatory Visit: Payer: BLUE CROSS/BLUE SHIELD | Admitting: Internal Medicine

## 2016-03-07 ENCOUNTER — Telehealth: Payer: Self-pay

## 2016-03-07 ENCOUNTER — Telehealth: Payer: Self-pay | Admitting: *Deleted

## 2016-03-07 MED ORDER — CLONAZEPAM 0.5 MG PO TABS: ORAL_TABLET | ORAL | 0 refills | Status: DC

## 2016-03-07 NOTE — Telephone Encounter (Signed)
Pt requested to have a medication prior to his MRI scheduled on 03/11/16, to help with his anxiety of the machine.  Pt contact [phone]

## 2016-03-07 NOTE — Addendum Note (Signed)
Addended by: Sherlene ShamsULLO, Avanthika Dehnert L on: 03/07/2016 01:28 PM   Modules accepted: Orders

## 2016-03-07 NOTE — Telephone Encounter (Signed)
Waiting on approval from Dr. Darrick Huntsmanullo

## 2016-03-07 NOTE — Telephone Encounter (Signed)
Rx request Clonazepam 0.5 mg for anxiety before his MRI appt 03/11/16 Last OV 08/13/2015 Next OV 03/10/2016 Last refilled   08/13/2015 Please advise

## 2016-03-07 NOTE — Telephone Encounter (Signed)
[facility]  

## 2016-03-07 NOTE — Telephone Encounter (Signed)
Completed.

## 2016-03-07 NOTE — Telephone Encounter (Signed)
Refilled for qty #2

## 2016-03-10 ENCOUNTER — Ambulatory Visit: Payer: BLUE CROSS/BLUE SHIELD | Admitting: Internal Medicine

## 2016-03-17 ENCOUNTER — Other Ambulatory Visit: Payer: Self-pay | Admitting: Family Medicine

## 2016-03-17 ENCOUNTER — Telehealth: Payer: Self-pay | Admitting: Internal Medicine

## 2016-03-17 NOTE — Telephone Encounter (Signed)
Pt called and is requesting a refill on his colchicine 0.6 MG tablet. He has no more medication. Please advise, thank you!  [phone] [place], [place] - [address]  Call pt @ [phone]

## 2016-03-20 NOTE — Telephone Encounter (Signed)
Rx done and Crawford GivensGraham Duncan md sent to pharmacy 03/17/2016

## 2016-03-27 ENCOUNTER — Other Ambulatory Visit: Payer: Self-pay | Admitting: Internal Medicine

## 2016-04-09 ENCOUNTER — Other Ambulatory Visit: Payer: Self-pay | Admitting: Internal Medicine

## 2016-04-10 NOTE — Telephone Encounter (Signed)
Pt made appt for 05/05/16

## 2016-04-10 NOTE — Telephone Encounter (Signed)
Please notify patient that the prescription  was Refilled for 30 days only because it has been 9 months since last visit. .  OFFICE VISIT NEEDED prior to any more refills  

## 2016-04-10 NOTE — Telephone Encounter (Signed)
Pt last refill on Wellbutrin was on 01/11/16. No showed for last OV, ok to refill?

## 2016-04-10 NOTE — Telephone Encounter (Signed)
Could you call pt and schedule OV for next set of refills?

## 2016-04-29 ENCOUNTER — Other Ambulatory Visit: Payer: Self-pay | Admitting: Internal Medicine

## 2016-05-05 ENCOUNTER — Ambulatory Visit: Payer: BLUE CROSS/BLUE SHIELD | Admitting: Internal Medicine

## 2016-05-10 ENCOUNTER — Other Ambulatory Visit: Payer: Self-pay | Admitting: Family Medicine

## 2016-05-12 ENCOUNTER — Other Ambulatory Visit: Payer: Self-pay | Admitting: Internal Medicine

## 2016-05-17 ENCOUNTER — Other Ambulatory Visit: Payer: Self-pay | Admitting: Internal Medicine

## 2016-05-17 NOTE — Telephone Encounter (Signed)
Refilled 04/10/2016 Last OV: 08/13/2015 Next OV: not scheduled.

## 2016-05-17 NOTE — Telephone Encounter (Signed)
Refill for 60 days only.  OFFICE VISIT NEEDED prior to any more refills  

## 2016-05-18 NOTE — Telephone Encounter (Signed)
LMTCB. Need to schedule pt an appt befroe he can get any more refills.

## 2016-05-18 NOTE — Telephone Encounter (Signed)
Spoke with pt and scheduled him for a physical since he is due for that in months as well on 07/13/2016 @ 9:30am. While on the phone with the pt stated that he was prescribed 2 clonazepam back in January for an MRI and he stated that he is needing a refill.   Please advise.

## 2016-05-22 ENCOUNTER — Other Ambulatory Visit: Payer: Self-pay | Admitting: Internal Medicine

## 2016-05-22 NOTE — Telephone Encounter (Signed)
Refilled: 05/10/16 by Dr.Duncan but E-scribe transmission failed. Last OV: 11/01/15 by Dr.Graham. Last Labs: 07/09/15 Future OV: 07/13/16 Please advise?

## 2016-05-23 NOTE — Telephone Encounter (Signed)
Colchicine refilled

## 2016-05-23 NOTE — Telephone Encounter (Signed)
Clonazepam refill denied until he has an appointment

## 2016-05-24 NOTE — Telephone Encounter (Signed)
LMTCB

## 2016-05-25 ENCOUNTER — Telehealth: Payer: Self-pay | Admitting: *Deleted

## 2016-05-25 NOTE — Telephone Encounter (Signed)
Requested medication refill for :Clonazepam  [facility] [place]  Please Contact Pt when ready or sent to Pharmacy:  [phone]

## 2016-05-25 NOTE — Telephone Encounter (Signed)
Duplicate message. Documenting in the previous message.

## 2016-05-25 NOTE — Telephone Encounter (Signed)
LMTCB

## 2016-05-26 NOTE — Telephone Encounter (Signed)
Pt called back returning your call. Thank you!  Call pt @ [phone]. Please leave msg if he does not answer.

## 2016-05-29 NOTE — Telephone Encounter (Signed)
Per pt request message was left on pt's voicemail.

## 2016-05-29 NOTE — Telephone Encounter (Signed)
Per pt's request message was left on pt's voicemail.

## 2016-05-29 NOTE — Telephone Encounter (Signed)
LMTCB

## 2016-06-23 ENCOUNTER — Telehealth: Payer: Self-pay | Admitting: Internal Medicine

## 2016-06-23 ENCOUNTER — Other Ambulatory Visit: Payer: Self-pay | Admitting: Internal Medicine

## 2016-06-23 NOTE — Telephone Encounter (Signed)
Pt called about needing a refill for traMADol (ULTRAM) 50 MG tablet.  [id number] [place], [place] - [address]  Call pt @ [phone]. Thank you!

## 2016-06-23 NOTE — Telephone Encounter (Signed)
Refills on controlled substances are denied until he is seen   bc his last visit wias June 2017

## 2016-06-23 NOTE — Telephone Encounter (Signed)
Pt called about needing a referral to [facility] the neurologist at [facility] or unless Dr Darrick Huntsmanullo can suggest someone in [place]? Please advise?  Call pt @ [phone]. Thank you!

## 2016-06-23 NOTE — Telephone Encounter (Signed)
I have not seen patient since June 2017 so I cannot make a referral for anything until he is seen

## 2016-06-23 NOTE — Telephone Encounter (Signed)
Refilled: 02/08/2016 Last OV: 05/16/2015 Next OV: 07/13/2016

## 2016-06-23 NOTE — Telephone Encounter (Signed)
Please advise 

## 2016-06-26 NOTE — Telephone Encounter (Signed)
LMTCB

## 2016-06-26 NOTE — Telephone Encounter (Signed)
Left a detailed message on voicemail per pt.

## 2016-06-26 NOTE — Telephone Encounter (Signed)
Called pt and he stated that he just wanted clarification that tramadol was a controlled substance. I explained to that pt that it is. Pt gave a verbal understanding.

## 2016-06-26 NOTE — Telephone Encounter (Signed)
Pt called back returning your call. Pt states it's ok to leave. Thank you!

## 2016-06-26 NOTE — Telephone Encounter (Signed)
Left a detailed message on voicemail per pt.  

## 2016-06-26 NOTE — Telephone Encounter (Signed)
Pt called back and stated that he has a question to follow up on.  Call pt @ [phone]

## 2016-07-10 ENCOUNTER — Telehealth: Payer: Self-pay | Admitting: *Deleted

## 2016-07-10 NOTE — Telephone Encounter (Signed)
Report can be viewed through [facility],

## 2016-07-10 NOTE — Telephone Encounter (Signed)
Patient has requested to know if Dr.Tullo received his brain MRI scan  If not please call pt at [phone] please leave a message if no answered

## 2016-07-10 NOTE — Telephone Encounter (Signed)
Did not see this in the pt's chart. Have you seen this? Please advise.

## 2016-07-13 ENCOUNTER — Encounter: Payer: Self-pay | Admitting: Internal Medicine

## 2016-07-13 ENCOUNTER — Ambulatory Visit (INDEPENDENT_AMBULATORY_CARE_PROVIDER_SITE_OTHER): Payer: BLUE CROSS/BLUE SHIELD | Admitting: Internal Medicine

## 2016-07-13 ENCOUNTER — Other Ambulatory Visit: Payer: Self-pay | Admitting: Internal Medicine

## 2016-07-13 VITALS — BP 134/84 | HR 70 | Temp 98.3°F | Resp 17 | Ht 70.0 in | Wt 182.6 lb

## 2016-07-13 DIAGNOSIS — I10 Essential (primary) hypertension: Secondary | ICD-10-CM

## 2016-07-13 DIAGNOSIS — E785 Hyperlipidemia, unspecified: Secondary | ICD-10-CM

## 2016-07-13 DIAGNOSIS — F1010 Alcohol abuse, uncomplicated: Secondary | ICD-10-CM

## 2016-07-13 DIAGNOSIS — F321 Major depressive disorder, single episode, moderate: Secondary | ICD-10-CM

## 2016-07-13 DIAGNOSIS — Z125 Encounter for screening for malignant neoplasm of prostate: Secondary | ICD-10-CM | POA: Diagnosis not present

## 2016-07-13 DIAGNOSIS — R5383 Other fatigue: Secondary | ICD-10-CM | POA: Diagnosis not present

## 2016-07-13 DIAGNOSIS — R7301 Impaired fasting glucose: Secondary | ICD-10-CM | POA: Diagnosis not present

## 2016-07-13 LAB — CBC WITH DIFFERENTIAL/PLATELET
BASOS ABS: 0 10*3/uL (ref 0.0–0.1)
Basophils Relative: 1.1 % (ref 0.0–3.0)
EOS ABS: 0.3 10*3/uL (ref 0.0–0.7)
Eosinophils Relative: 8 % — ABNORMAL HIGH (ref 0.0–5.0)
HEMATOCRIT: 43.1 % (ref 39.0–52.0)
HEMOGLOBIN: 14.7 g/dL (ref 13.0–17.0)
LYMPHS PCT: 23.5 % (ref 12.0–46.0)
Lymphs Abs: 1 10*3/uL (ref 0.7–4.0)
MCHC: 34 g/dL (ref 30.0–36.0)
MCV: 95.8 fl (ref 78.0–100.0)
MONO ABS: 0.6 10*3/uL (ref 0.1–1.0)
Monocytes Relative: 13.3 % — ABNORMAL HIGH (ref 3.0–12.0)
Neutro Abs: 2.4 10*3/uL (ref 1.4–7.7)
Neutrophils Relative %: 54.1 % (ref 43.0–77.0)
PLATELETS: 233 10*3/uL (ref 150.0–400.0)
RBC: 4.5 Mil/uL (ref 4.22–5.81)
RDW: 13.8 % (ref 11.5–15.5)
WBC: 4.4 10*3/uL (ref 4.0–10.5)

## 2016-07-13 LAB — VITAMIN D 25 HYDROXY (VIT D DEFICIENCY, FRACTURES): VITD: 26.91 ng/mL — ABNORMAL LOW (ref 30.00–100.00)

## 2016-07-13 LAB — LIPID PANEL
CHOL/HDL RATIO: 3
Cholesterol: 262 mg/dL — ABNORMAL HIGH (ref 0–200)
HDL: 80.3 mg/dL (ref >39.00)
LDL CALC: 162 mg/dL — AB (ref 0–99)
NONHDL: 181.46
Triglycerides: 99 mg/dL (ref 0.0–149.0)
VLDL: 19.8 mg/dL (ref 0.0–40.0)

## 2016-07-13 LAB — COMPREHENSIVE METABOLIC PANEL
ALBUMIN: 4.1 g/dL (ref 3.5–5.2)
ALT: 33 U/L (ref 0–53)
AST: 35 U/L (ref 0–37)
Alkaline Phosphatase: 50 U/L (ref 39–117)
BILIRUBIN TOTAL: 0.6 mg/dL (ref 0.2–1.2)
BUN: 15 mg/dL (ref 6–23)
CALCIUM: 9.3 mg/dL (ref 8.4–10.5)
CHLORIDE: 99 meq/L (ref 96–112)
CO2: 31 meq/L (ref 19–32)
Creatinine, Ser: 0.95 mg/dL (ref 0.40–1.50)
GFR: 87.72 mL/min (ref >60.00)
Glucose, Bld: 114 mg/dL — ABNORMAL HIGH (ref 70–99)
Potassium: 4.2 mEq/L (ref 3.5–5.1)
SODIUM: 135 meq/L (ref 135–145)
Total Protein: 6.9 g/dL (ref 6.0–8.3)

## 2016-07-13 LAB — HEMOGLOBIN A1C: Hgb A1c MFr Bld: 5.7 % (ref 4.6–6.5)

## 2016-07-13 LAB — PSA: PSA: 2.55 ng/mL (ref 0.10–4.00)

## 2016-07-13 MED ORDER — CLONAZEPAM 0.5 MG PO TABS: 0.5000 mg | ORAL_TABLET | Freq: Every day | ORAL | 1 refills | Status: DC

## 2016-07-13 MED ORDER — SERTRALINE HCL 50 MG PO TABS
50.0000 mg | ORAL_TABLET | Freq: Every day | ORAL | 3 refills | Status: DC
Start: 2016-07-13 — End: 2016-08-28

## 2016-07-13 NOTE — Patient Instructions (Signed)
I am recommending  A trial of zoloft to mange your depression Please start the medication at 1/2 tablet daily in the evening for the first few days to avoid nausea.  You can increase to a full tablet after 4 days if you havenot developed side effects of nausea.  If the lexapro interferes with your sleep, take it in the morning instead  Please stop drinking alcohol.  There is no middle road to managing alcohol abuse.    I will refill the clonazepam to help you sleep  I also recommend that you consider seeing a therapist names Colen DarlingLisa Flores,  In [place].  She is with [facility] , and I HIGHLY recommend her.   Please return in  4 weeks

## 2016-07-13 NOTE — Progress Notes (Addendum)
Patient ID: Jeff [name], male    DOB: 05/28/1960  Age: 56 y.o. MRN: [id number]  [name] here for annual phyiscal examination and management of other chronic and acute problems.   colonoscopy 2016 Hep c screen was done  2016 Hiv screened 2014 Last labs June 2017,  Done today       The risk factors are reflected in the social history.  The roster of all physicians providing medical care to patient - is listed in the Snapshot section of the chart.  Activities of daily living:  [name] 100% independent in all ADLs: dressing, toileting, feeding as well as independent mobility  Home safety : The patient has smoke detectors in the home. They wear seatbelts.  There are no firearms at home. There is no violence in the home.   There is no risks for hepatitis, STDs or HIV. There is no   history of blood transfusion. They have no travel history to infectious disease endemic areas of the world.   Diet: the importance of a healthy diet is discussed. They do have a healthy diet.  The benefits of regular aerobic exercise were discussed. She walks 4 times per week ,  20 minutes.   Depression screen: there are MULTIPLE SIGNS  AND symptoms of depression- irritability, change in appetite, anhedonia, sadness/tearfullness.  Cognitive assessment: the patient manages all their financial and personal affairs and is actively engaged. They could relate day,date,year and events; recalled 2/3 objects at 3 minutes; performed clock-face test normally.  The following portions of the patient's history were reviewed and updated as appropriate: allergies, current medications, past family history, past medical history,  past surgical history, past social history  and problem list.  Visual acuity was not assessed per patient preference since she has regular follow up with her ophthalmologist. Hearing and body mass index were assessed and reviewed.   During the course of the visit the patient was  educated and counseled about appropriate screening and preventive services including : fall prevention , diabetes screening, nutrition counseling, colorectal cancer screening, and recommended immunizations.    CC: The primary encounter diagnosis was Other fatigue. Diagnoses of Screening for prostate cancer, Impaired fasting glucose, Hyperlipidemia, unspecified hyperlipidemia type, Essential hypertension, Alcohol abuse, and Current moderate episode of major depressive disorder without prior episode [facility](HCC) were also pertinent to this visit.  Last seen June 2017.   Does not freely admit to having an addiction to alcohol,  States that he doesn't drink daily but drinks 2-3 glasses of wine or 6 pack if he drinks .  Separating from wife Jeff [name] ,  His dog  died unexpectedly last weekend. Depressed:  Not suicidal. Has not considered it and Loves his family too much to ever resort to doing something so grievous .  Vision changes/loss of vision in left eye , marriage dissolution,  Loss of brother/dogall  major triggers.Has recently started going back to the gym    Primary open angle glaucoma of btoh eyes,  Severe stage: Saw glaucoma specialist at [facility]  3 weeks ago.  Vision still blurry.  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 S/p bleb revision surgery Jan 2017 , cataract sugery on the right  Was told he needed a Neurology referral  ,  Has been seeing Jeff [name] and Jeff [name] .  MRI of brain was repeated and [facility]. (last one 2015) ,  Global atrophy noted (advanced for age).    Reviewed his recent request  for referral to Jeff [name]   After considerable discussion and sorting out of his multiple unresolved issues,  He understands the difference between neurosurgery and neuro ophthalmology clarifies that the request was because of cervical spine issues.     Seeing urology June 25 [place]  Seeing Jeff [name] (ENT)  for evaluation of hoarseness  Fatty  liver Hypertension Cognitive changes    History Jeff [name] has a past medical history of Arthritis; Cataract; Claustrophobia; GERD (gastroesophageal reflux disease); Glaucoma (increased eye pressure); Headache(784.0); Hepatic steatosis; Hyperglycemia; Hyperlipidemia; Hypertension; and Sleep apnea.   He has a past surgical history that includes Mini shunt insertion (03/09/2011); Finger surgery (Right); Eye surgery; Cataract extraction w/PHACO (Left, 07/16/2013); and Cataract extraction w/PHACO (Right, 10/29/2013).   His family history includes Aneurysm in his brother and father; Asthma in his father, mother, and paternal aunt; Early death in his brother and sister; Heart disease in his father, mother, and sister; Hyperlipidemia in his father and mother; Hypertension in his father and mother; Liver cancer in his mother; Stroke in his father.He reports that he has never smoked. He has never used smokeless tobacco. He reports that he drinks about 3.6 - 4.8 oz of alcohol per week . He reports that he does not use drugs.  Outpatient Medications Prior to Visit  Medication Sig Dispense Refill  . allopurinol (ZYLOPRIM) 300 MG tablet TAKE 1/2 TABLETS (150 MG TOTAL) BY MOUTH DAILY. 90 tablet 0  . buPROPion (WELLBUTRIN) 100 MG tablet TAKE 1 TABLET (100 MG TOTAL) BY MOUTH 2 (TWO) TIMES DAILY. 60 tablet 1  . colchicine 0.6 MG tablet TAKE HOURLY UNTIL GOUT IMPROVED/DIARRHEA, ON FIRST DAY OF TREATMENT.TAKE DAILY THEREAFTER AS NEEDED 30 tablet 0  . losartan-hydrochlorothiazide (HYZAAR) 100-25 MG tablet TAKE 1 TABLET BY MOUTH DAILY. 90 tablet 1  . metoprolol succinate (TOPROL-XL) 25 MG 24 hr tablet TAKE 1 TABLET (25 MG TOTAL) BY MOUTH DAILY. 90 tablet 1  . omeprazole (PRILOSEC) 40 MG capsule Take 1 capsule (40 mg total) by mouth daily. 90 capsule 3  . PRED FORTE 1 % ophthalmic suspension   6  . tamsulosin (FLOMAX) 0.4 MG CAPS capsule Take 1 capsule (0.4 mg total) by mouth daily. 90 capsule 3  . traMADol (ULTRAM) 50 MG  tablet Take 1 tablet (50 mg total) by mouth every 6 (six) hours as needed. 90 tablet 0  . traZODone (DESYREL) 50 MG tablet Take 1 tablet (50 mg total) by mouth daily as needed for sleep. 90 tablet 1  . clonazePAM (KLONOPIN) 0.5 MG tablet 1-2 tablets as needed take 30 minutes prior to MRI 2 tablet 0  . tiZANidine (ZANAFLEX) 4 MG tablet TAKE 1 TABLET (4 MG TOTAL) BY MOUTH AT BEDTIME. (Patient not taking: Reported on 07/13/2016) 90 tablet 1  . albuterol (PROVENTIL HFA;VENTOLIN HFA) 108 (90 BASE) MCG/ACT inhaler Inhale 2 puffs into the lungs every 6 (six) hours as needed for wheezing or shortness of breath. (Patient not taking: Reported on 07/13/2016) 1 Inhaler 11  . colchicine 0.6 MG tablet TAKE HOURLY UNTIL GOUT IMPROVED/DIARRHEA, ON FIRST DAY OF TREATMENT.TAKE DAILY THEREAFTER AS NEEDED (Patient not taking: Reported on 07/13/2016) 30 tablet 0  . HYDROcodone-acetaminophen (NORCO/VICODIN) 5-325 MG tablet Take 1 tablet by mouth every 6 (six) hours as needed for moderate pain. (Patient not taking: Reported on 07/13/2016) 15 tablet 0  . predniSONE (DELTASONE) 20 MG tablet 2 a day for 3 days, then 1 a day for 3 days, then 0.5 a day for 4 days, with food. (Patient  not taking: Reported on 07/13/2016) 11 tablet 0  . TIMOLOL MALEATE OP Apply to eye 2 (two) times daily. Right eye     Facility-Administered Medications Prior to Visit  Medication Dose Route Frequency Provider Last Rate Last Dose  . lidocaine (XYLOCAINE) 1 % (with pres) injection 4 mL  4 mL Other Once Sherlene Shams, MD      . triamcinolone acetonide (KENALOG-40) injection 40 mg  40 mg Intra-articular Once Sherlene Shams, MD        Review of Systems   Patient denies headache, fevers, malaise, unintentional weight loss, skin rash, eye pain, sinus congestion and sinus pain, sore throat, dysphagia,  hemoptysis , cough, dyspnea, wheezing, chest pain, palpitations, orthopnea, edema, abdominal pain, nausea, melena, diarrhea, constipation, flank pain,  dysuria, hematuria, urinary  Frequency, nocturia, numbness, tingling, seizures,  Focal weakness, Loss of consciousness,  Tremor, insomnia, depression, anxiety, and suicidal ideation.      Objective:  BP 134/84 (BP Location: Left Arm, Patient Position: Sitting, Cuff Size: Normal)   Pulse 70   Temp 98.3 F (36.8 C) (Oral)   Resp 17   Ht 5\' 10"  (1.778 m)   Wt 182 lb 9.6 oz (82.8 kg)   SpO2 97%   BMI 26.20 kg/m   Physical Exam  General appearance: alert, cooperative and appears stated age Ears: normal TM's and external ear canals both ears Throat: lips, mucosa, and tongue normal; teeth and gums normal Neck: no adenopathy, no carotid bruit, supple, symmetrical, trachea midline and thyroid not enlarged, symmetric, no tenderness/mass/nodules Back: symmetric, no curvature. ROM normal. No CVA tenderness. Lungs: clear to auscultation bilaterally Heart: regular rate and rhythm, S1, S2 normal, no murmur, click, rub or gallop Abdomen: soft, non-tender; bowel sounds normal; no masses,  no organomegaly Pulses: 2+ and symmetric Skin: Skin color, texture, turgor normal. No rashes or lesions Lymph nodes: Cervical, supraclavicular, and axillary nodes normal. Psych: affect normal, makes good eye contact. No fidgeting,  Smiles easily.  Denies suicidal thoughts      Assessment & Plan:   Problem List Items Addressed This Visit    Screening for prostate cancer   Relevant Orders   PSA (Completed)   Major depressive disorder with single episode    Triggered by alcohol abuse, estrangement from wife, the death of his parents and more recently his dog.  Trial fo zolof t.  rtc 1 month       Relevant Medications   sertraline (ZOLOFT) 50 MG tablet   Impaired fasting glucose    His a1c has increased to 5.7 .  Advised to resume a low glycemic index diet. And regular exercise.   Lab Results  Component Value Date   HGBA1C 5.7 07/13/2016   Lab Results  Component Value Date   CHOL 262 (H) 07/13/2016    HDL 80.30 07/13/2016   LDLCALC 162 (H) 07/13/2016   LDLDIRECT 144.0 08/06/2012   TRIG 99.0 07/13/2016   CHOLHDL 3 07/13/2016                 Relevant Orders   Hemoglobin A1c (Completed)   Hyperlipidemia    Based on his current lipid profile, the risk of clinically significant Coronary artery disease is 13% over the next 10 years, using the Framingham risk calculator.  Moderate potency statin advised.        Relevant Orders   Lipid Profile (Completed)   HTN (hypertension)    Well controlled on current regimen. Renal function stable, no changes today.  Lab Results  Component Value Date   CREATININE 0.95 07/13/2016   Lab Results  Component Value Date   NA 135 07/13/2016   K 4.2 07/13/2016   CL 99 07/13/2016   CO2 31 07/13/2016         Relevant Orders   Comprehensive metabolic panel (Completed)   Alcohol abuse    Jeff [name] discussion about his apparent denial . ADVISED TO STOP DRINKING AND ATTEND AAA       Other Visit Diagnoses    Other fatigue    -  Primary   Relevant Orders   Vitamin D (25 hydroxy) (Completed)   CBC w/Diff (Completed)     A total of 40 minutes was spent with patient more than half of which was spent in counseling patient on the above mentioned issues , reviewing and explaining recent labs and imaging studies done, and coordination of care.  I have discontinued Mr. Apuzzo albuterol, TIMOLOL MALEATE OP, predniSONE, HYDROcodone-acetaminophen, and clonazePAM. I am also having him start on sertraline and clonazePAM. Additionally, I am having him maintain his tiZANidine, PRED FORTE, allopurinol, traZODone, tamsulosin, traMADol, omeprazole, metoprolol succinate, losartan-hydrochlorothiazide, colchicine, buPROPion, chlorhexidine, and dorzolamide-timolol. We will continue to administer triamcinolone acetonide and lidocaine.  Meds ordered this encounter  Medications  . chlorhexidine (PERIDEX) 0.12 % solution    Sig: RINSE WITH 1 CAPFUL TWICE A DAY     Refill:  0  . dorzolamide-timolol (COSOPT) 22.3-6.8 MG/ML ophthalmic solution    Sig: PLACE 1 DROP INTO THE RIGHT EYE 2 (TWO) TIMES DAILY.    Refill:  11  . sertraline (ZOLOFT) 50 MG tablet    Sig: Take 1 tablet (50 mg total) by mouth daily.    Dispense:  30 tablet    Refill:  3  . clonazePAM (KLONOPIN) 0.5 MG tablet    Sig: Take 1 tablet (0.5 mg total) by mouth at bedtime. As needed for insomnia    Dispense:  30 tablet    Refill:  1    Medications Discontinued During This Encounter  Medication Reason  . albuterol (PROVENTIL HFA;VENTOLIN HFA) 108 (90 BASE) MCG/ACT inhaler Patient has not taken in last 30 days  . colchicine 0.6 MG tablet Duplicate  . HYDROcodone-acetaminophen (NORCO/VICODIN) 5-325 MG tablet Patient has not taken in last 30 days  . predniSONE (DELTASONE) 20 MG tablet Therapy completed  . TIMOLOL MALEATE OP Patient has not taken in last 30 days  . clonazePAM (KLONOPIN) 0.5 MG tablet     Follow-up: Return in about 4 weeks (around 08/10/2016).   Sherlene Shams, MD

## 2016-07-16 NOTE — Assessment & Plan Note (Signed)
Triggered by alcohol abuse, estrangement from wife, the death of his parents and more recently his dog.  Trial fo zolof t.  rtc 1 month

## 2016-07-16 NOTE — Assessment & Plan Note (Signed)
His a1c has increased to 5.7 .  Advised to resume a low glycemic index diet. And regular exercise.   Lab Results  Component Value Date   HGBA1C 5.7 07/13/2016   Lab Results  Component Value Date   CHOL 262 (H) 07/13/2016   HDL 80.30 07/13/2016   LDLCALC 162 (H) 07/13/2016   LDLDIRECT 144.0 08/06/2012   TRIG 99.0 07/13/2016   CHOLHDL 3 07/13/2016

## 2016-07-16 NOTE — Assessment & Plan Note (Addendum)
Jeff FellersFrank discussion about his apparent denial . ADVISED TO STOP DRINKING AND ATTEND AAA

## 2016-07-17 ENCOUNTER — Encounter: Payer: Self-pay | Admitting: Internal Medicine

## 2016-07-17 NOTE — Assessment & Plan Note (Signed)
Based on his current lipid profile, the risk of clinically significant Coronary artery disease is 13% over the next 10 years, using the Framingham risk calculator.  Moderate potency statin advised.

## 2016-07-17 NOTE — Assessment & Plan Note (Signed)
Well controlled on current regimen. Renal function stable, no changes today.  Lab Results  Component Value Date   CREATININE 0.95 07/13/2016   Lab Results  Component Value Date   NA 135 07/13/2016   K 4.2 07/13/2016   CL 99 07/13/2016   CO2 31 07/13/2016

## 2016-07-21 ENCOUNTER — Other Ambulatory Visit: Payer: Self-pay

## 2016-07-21 MED ORDER — TRAMADOL HCL 50 MG PO TABS: 50.0000 mg | ORAL_TABLET | Freq: Four times a day (QID) | ORAL | 0 refills | Status: DC | PRN

## 2016-07-21 NOTE — Telephone Encounter (Signed)
Medication has been refilled.

## 2016-07-21 NOTE — Telephone Encounter (Signed)
Printed, signed and faxed.  

## 2016-07-24 ENCOUNTER — Encounter (HOSPITAL_COMMUNITY): Payer: Self-pay | Admitting: Emergency Medicine

## 2016-07-24 ENCOUNTER — Emergency Department (HOSPITAL_COMMUNITY)
Admission: EM | Admit: 2016-07-24 | Discharge: 2016-07-25 | Disposition: A | Discharge status: Home / Self Care | Payer: BLUE CROSS/BLUE SHIELD | Attending: Emergency Medicine | Admitting: Emergency Medicine

## 2016-07-24 ENCOUNTER — Emergency Department (HOSPITAL_COMMUNITY): Payer: BLUE CROSS/BLUE SHIELD

## 2016-07-24 DIAGNOSIS — Y999 Unspecified external cause status: Secondary | ICD-10-CM | POA: Insufficient documentation

## 2016-07-24 DIAGNOSIS — Y939 Activity, unspecified: Secondary | ICD-10-CM | POA: Diagnosis not present

## 2016-07-24 DIAGNOSIS — S301XXA Contusion of abdominal wall, initial encounter: Secondary | ICD-10-CM | POA: Diagnosis not present

## 2016-07-24 DIAGNOSIS — Z79899 Other long term (current) drug therapy: Secondary | ICD-10-CM | POA: Diagnosis not present

## 2016-07-24 DIAGNOSIS — S20212A Contusion of left front wall of thorax, initial encounter: Secondary | ICD-10-CM | POA: Insufficient documentation

## 2016-07-24 DIAGNOSIS — Y92410 Unspecified street and highway as the place of occurrence of the external cause: Secondary | ICD-10-CM | POA: Diagnosis not present

## 2016-07-24 DIAGNOSIS — I10 Essential (primary) hypertension: Secondary | ICD-10-CM | POA: Diagnosis not present

## 2016-07-24 DIAGNOSIS — S299XXA Unspecified injury of thorax, initial encounter: Secondary | ICD-10-CM | POA: Diagnosis present

## 2016-07-24 LAB — I-STAT TROPONIN, ED: Troponin i, poc: 0 ng/mL (ref 0.00–0.08)

## 2016-07-24 LAB — I-STAT CHEM 8, ED
BUN: 19 mg/dL (ref 6–20)
CHLORIDE: 98 mmol/L — AB (ref 101–111)
Calcium, Ion: 1.17 mmol/L (ref 1.15–1.40)
Creatinine, Ser: 1 mg/dL (ref 0.61–1.24)
Glucose, Bld: 109 mg/dL — ABNORMAL HIGH (ref 65–99)
HCT: 44 % (ref 39.0–52.0)
Hemoglobin: 15 g/dL (ref 13.0–17.0)
Potassium: 3.7 mmol/L (ref 3.5–5.1)
SODIUM: 136 mmol/L (ref 135–145)
TCO2: 29 mmol/L (ref 0–100)

## 2016-07-24 MED ORDER — IOPAMIDOL (ISOVUE-300) INJECTION 61%
INTRAVENOUS | Status: AC
  Filled 2016-07-24: qty 100

## 2016-07-24 MED ORDER — OXYCODONE-ACETAMINOPHEN 5-325 MG PO TABS
2.0000 | ORAL_TABLET | Freq: Once | ORAL | Status: AC
  Administered 2016-07-24: 2 via ORAL
  Filled 2016-07-24: qty 2

## 2016-07-24 MED ORDER — IOPAMIDOL (ISOVUE-300) INJECTION 61%
INTRAVENOUS | Status: AC
  Administered 2016-07-24: 75 mL via INTRAVENOUS
  Filled 2016-07-24: qty 100
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
## 2016-07-24 NOTE — ED Notes (Signed)
Fast track pt see PA's assessment

## 2016-07-24 NOTE — ED Notes (Signed)
Patient transported to X-ray 

## 2016-07-24 NOTE — ED Notes (Signed)
Pt back from X-ray.  

## 2016-07-24 NOTE — ED Notes (Signed)
Patient transported to X-ray prior to RN seeing pt
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
## 2016-07-24 NOTE — ED Notes (Signed)
Patient transported to CT 

## 2016-07-25 ENCOUNTER — Telehealth: Payer: Self-pay | Admitting: Internal Medicine

## 2016-07-25 DIAGNOSIS — E042 Nontoxic multinodular goiter: Secondary | ICD-10-CM | POA: Insufficient documentation

## 2016-07-25 MED ORDER — CYCLOBENZAPRINE HCL 10 MG PO TABS: 10.0000 mg | ORAL_TABLET | Freq: Two times a day (BID) | ORAL | 0 refills | Status: DC | PRN

## 2016-07-25 MED ORDER — OXYCODONE-ACETAMINOPHEN 5-325 MG PO TABS: 1.0000 | ORAL_TABLET | ORAL | 0 refills | Status: DC | PRN

## 2016-07-25 NOTE — Assessment & Plan Note (Signed)
Incidental finding on CT of chest.  Referral to endocrinology advised.
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
## 2016-07-25 NOTE — Telephone Encounter (Signed)
FYI
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
## 2016-07-25 NOTE — Telephone Encounter (Signed)
He will need to see our endocrinologist who will decide if they need to be biopsied based on whether they are cold or hot nodules.  I will make the referral

## 2016-07-25 NOTE — ED Notes (Signed)
Signature pad not working, pt verbalized understanding of discharge instructions including follow-up instructions and prescriptions.

## 2016-07-26 NOTE — Telephone Encounter (Signed)
Spoke with pt and informed him that Dr. Darrick Huntsmanullo would like for him to see an endocrinologist for the nodules that was seen on his thyroid. Explained to pt that Melissa would be giving him a call when the appt was scheduled. Pt gave a verbal understanding.

## 2016-07-31 ENCOUNTER — Ambulatory Visit
Admission: RE | Admit: 2016-07-31 | Discharge: 2016-07-31 | Disposition: A | Discharge status: Home / Self Care | Payer: BLUE CROSS/BLUE SHIELD | Source: Ambulatory Visit | Attending: Orthopedic Surgery | Admitting: Orthopedic Surgery

## 2016-07-31 ENCOUNTER — Other Ambulatory Visit: Payer: Self-pay | Admitting: Orthopedic Surgery

## 2016-07-31 DIAGNOSIS — S62316A Displaced fracture of base of fifth metacarpal bone, right hand, initial encounter for closed fracture: Secondary | ICD-10-CM

## 2016-07-31 NOTE — Telephone Encounter (Signed)
Mailed unread message to patient.  

## 2016-08-07 ENCOUNTER — Encounter: Payer: Self-pay | Admitting: Endocrinology

## 2016-08-11 ENCOUNTER — Telehealth: Payer: Self-pay | Admitting: Internal Medicine

## 2016-08-11 DIAGNOSIS — Z79899 Other long term (current) drug therapy: Secondary | ICD-10-CM

## 2016-08-11 MED ORDER — SIMVASTATIN 20 MG PO TABS: 20.0000 mg | ORAL_TABLET | Freq: Every day | ORAL | 0 refills | Status: DC

## 2016-08-11 NOTE — Telephone Encounter (Signed)
Pt called and stated that Dr. Darrick Huntsmanullo had advised to have him start Zocor, pt wanted to think about it. Pt stated that he would like to start this medicaiton. Please advise, thank you!  Pharmacy - [facility] [phone] - [address], [place] - [address] AT [address] [facility]

## 2016-08-11 NOTE — Telephone Encounter (Signed)
Mychart message sent to patient.

## 2016-08-11 NOTE — Telephone Encounter (Signed)
Please advise 

## 2016-08-11 NOTE — Telephone Encounter (Signed)
[facility]  Please schedule patient for CMET in 4 weeks. Labs ordered

## 2016-08-14 ENCOUNTER — Ambulatory Visit: Payer: BLUE CROSS/BLUE SHIELD | Admitting: Internal Medicine

## 2016-08-18 ENCOUNTER — Other Ambulatory Visit: Payer: Self-pay | Admitting: Internal Medicine

## 2016-08-18 DIAGNOSIS — K219 Gastro-esophageal reflux disease without esophagitis: Secondary | ICD-10-CM | POA: Insufficient documentation

## 2016-08-18 DIAGNOSIS — D34 Benign neoplasm of thyroid gland: Secondary | ICD-10-CM | POA: Insufficient documentation

## 2016-08-21 ENCOUNTER — Ambulatory Visit: Payer: BLUE CROSS/BLUE SHIELD | Admitting: Internal Medicine

## 2016-08-22 ENCOUNTER — Other Ambulatory Visit: Payer: Self-pay | Admitting: Otolaryngology

## 2016-08-22 DIAGNOSIS — E041 Nontoxic single thyroid nodule: Secondary | ICD-10-CM

## 2016-08-28 ENCOUNTER — Telehealth: Payer: Self-pay | Admitting: Internal Medicine

## 2016-08-28 ENCOUNTER — Encounter: Payer: Self-pay | Admitting: Internal Medicine

## 2016-08-28 ENCOUNTER — Ambulatory Visit (INDEPENDENT_AMBULATORY_CARE_PROVIDER_SITE_OTHER): Payer: BLUE CROSS/BLUE SHIELD | Admitting: Internal Medicine

## 2016-08-28 VITALS — BP 110/80 | HR 85 | Temp 98.2°F | Resp 16 | Ht 71.0 in | Wt 189.8 lb

## 2016-08-28 DIAGNOSIS — R7301 Impaired fasting glucose: Secondary | ICD-10-CM

## 2016-08-28 DIAGNOSIS — Z0000 Encounter for general adult medical examination without abnormal findings: Secondary | ICD-10-CM

## 2016-08-28 DIAGNOSIS — I10 Essential (primary) hypertension: Secondary | ICD-10-CM

## 2016-08-28 DIAGNOSIS — Z79899 Other long term (current) drug therapy: Secondary | ICD-10-CM

## 2016-08-28 DIAGNOSIS — E785 Hyperlipidemia, unspecified: Secondary | ICD-10-CM

## 2016-08-28 DIAGNOSIS — S62312A Displaced fracture of base of third metacarpal bone, right hand, initial encounter for closed fracture: Secondary | ICD-10-CM | POA: Diagnosis not present

## 2016-08-28 DIAGNOSIS — R490 Dysphonia: Secondary | ICD-10-CM

## 2016-08-28 DIAGNOSIS — R419 Unspecified symptoms and signs involving cognitive functions and awareness: Secondary | ICD-10-CM

## 2016-08-28 DIAGNOSIS — K760 Fatty (change of) liver, not elsewhere classified: Secondary | ICD-10-CM

## 2016-08-28 DIAGNOSIS — H5440 Blindness, one eye, unspecified eye: Secondary | ICD-10-CM

## 2016-08-28 DIAGNOSIS — F1010 Alcohol abuse, uncomplicated: Secondary | ICD-10-CM | POA: Diagnosis not present

## 2016-08-28 DIAGNOSIS — F321 Major depressive disorder, single episode, moderate: Secondary | ICD-10-CM | POA: Diagnosis not present

## 2016-08-28 DIAGNOSIS — M109 Gout, unspecified: Secondary | ICD-10-CM

## 2016-08-28 DIAGNOSIS — F5102 Adjustment insomnia: Secondary | ICD-10-CM

## 2016-08-28 DIAGNOSIS — E042 Nontoxic multinodular goiter: Secondary | ICD-10-CM | POA: Diagnosis not present

## 2016-08-28 DIAGNOSIS — H401123 Primary open-angle glaucoma, left eye, severe stage: Secondary | ICD-10-CM

## 2016-08-28 MED ORDER — COLCHICINE 0.6 MG PO TABS: ORAL_TABLET | ORAL | 3 refills | Status: DC

## 2016-08-28 MED ORDER — BUPROPION HCL ER (SR) 150 MG PO TB12: 150.0000 mg | ORAL_TABLET | Freq: Two times a day (BID) | ORAL | 2 refills | Status: DC

## 2016-08-28 MED ORDER — CLONAZEPAM 0.5 MG PO TABS: 0.5000 mg | ORAL_TABLET | Freq: Every day | ORAL | 5 refills | Status: DC

## 2016-08-28 NOTE — Telephone Encounter (Signed)
Pt discharge note stated for pt to have fasting labs in 4weeks. Need order please and thank you! Appt was scheduled.

## 2016-08-28 NOTE — Patient Instructions (Addendum)
Reduce zoloft to every other day for one week,  Then stop  Increase the wellbutrin to 150 mg twice daily ,  New rx Sent to pharmacy   Ok to try 1/2 tablet of clonazepam in  The morning if needed for anxiety  I believe your elbow is swollen because your straps are too tight on your brace. I recommend that you Elevate the arm on a pillow and loosen the straps    For pain management:  I cannot prescribe Percocet since your othopedist has prescribed it, and you are taking clonazepam.   You can use advil 800 mg up to every 8 hours  Along,  Tylenol up to  2000 mg in divided dose s,  And tramadol 50 mg every 8 hours .
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
## 2016-08-29 DIAGNOSIS — H5440 Blindness, one eye, unspecified eye: Secondary | ICD-10-CM | POA: Insufficient documentation

## 2016-08-29 DIAGNOSIS — S62312A Displaced fracture of base of third metacarpal bone, right hand, initial encounter for closed fracture: Secondary | ICD-10-CM | POA: Insufficient documentation

## 2016-08-29 HISTORY — DX: Displaced fracture of base of third metacarpal bone, right hand, initial encounter for closed fracture: S62.312A

## 2016-08-29 LAB — COMPREHENSIVE METABOLIC PANEL
ALT: 44 U/L (ref 0–53)
AST: 33 U/L (ref 0–37)
Albumin: 3.8 g/dL (ref 3.5–5.2)
Alkaline Phosphatase: 86 U/L (ref 39–117)
BUN: 16 mg/dL (ref 6–23)
CALCIUM: 9.4 mg/dL (ref 8.4–10.5)
CHLORIDE: 93 meq/L — AB (ref 96–112)
CO2: 31 mEq/L (ref 19–32)
CREATININE: 0.97 mg/dL (ref 0.40–1.50)
GFR: 85.59 mL/min (ref >60.00)
Glucose, Bld: 109 mg/dL — ABNORMAL HIGH (ref 70–99)
Potassium: 4.1 mEq/L (ref 3.5–5.1)
Sodium: 133 mEq/L — ABNORMAL LOW (ref 135–145)
Total Bilirubin: 0.8 mg/dL (ref 0.2–1.2)
Total Protein: 7 g/dL (ref 6.0–8.3)

## 2016-08-29 NOTE — Assessment & Plan Note (Signed)
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
## 2016-08-29 NOTE — Telephone Encounter (Signed)
Ordered the lipid panel. Is there anything else that you would like to have drawn when he comes back in on 09/28/2016 for fasting lab work?

## 2016-08-29 NOTE — Assessment & Plan Note (Signed)
His a1c has increased to 5.7 .  Advised to resume a low glycemic index diet. And regular exercise.   Lab Results  Component Value Date   HGBA1C 5.7 07/13/2016   Lab Results  Component Value Date   CHOL 262 (H) 07/13/2016   HDL 80.30 07/13/2016   LDLCALC 162 (H) 07/13/2016   LDLDIRECT 144.0 08/06/2012   TRIG 99.0 07/13/2016   CHOLHDL 3 07/13/2016
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
## 2016-08-29 NOTE — Assessment & Plan Note (Addendum)
largews was 2.2 cm right side.  Follow up with [facility]  Jearld FentonByers for US guided biopsy .  Incidental finding

## 2016-08-29 NOTE — Assessment & Plan Note (Signed)
Continued to advise him  to sustain from alcohol. Hep A & B vaccines offered

## 2016-08-29 NOTE — Assessment & Plan Note (Signed)
Continue  trazodone  100 mg one hour before bedtime .  Allowing use of clonazepam currently since he is not in possession of percocet. .  The risks and benefits of benzodiazepine use were discussed with patient today including excessive sedation leading to respiratory depression,  impaired thinking/driving, and addiction.  Patient was advised to avoid concurrent use with alcohol, to use medication only as needed and not to share with others  .

## 2016-08-29 NOTE — Assessment & Plan Note (Signed)
S/[facility] June 28 [facility]. protonix bid started,  Follow up august 1

## 2016-08-29 NOTE — Assessment & Plan Note (Signed)
Secondary to glaucoma, ischemic optic neuropathy. Managed by [facility]

## 2016-08-29 NOTE — Assessment & Plan Note (Addendum)
He continues to report difficulty staying on task and concentrating .  Will increase wellbutrin to 300 mg daily.   Referral for cognitive testing  Was made  But deferred by patient last year

## 2016-08-29 NOTE — Telephone Encounter (Signed)
Thank you for ordering!  .  Uric acid and cmet added.

## 2016-08-29 NOTE — Assessment & Plan Note (Signed)
Well controlled on current regimen. Renal function stable, no changes today.  Lab Results  Component Value Date   CREATININE 1.00 07/24/2016   Lab Results  Component Value Date   NA 136 07/24/2016   K 3.7 07/24/2016   CL 98 (L) 07/24/2016   CO2 31 07/13/2016

## 2016-08-29 NOTE — Assessment & Plan Note (Signed)
In large part reactive to loss of vision in left eye,  Loss of father and loss of beloved dog earlier in the year.  Marriage is also in jeopardy due to his histor of alcohol abuse.  Weaning off zoloft,  Increase wellbutrin to 150 mg daily xl

## 2016-08-30 ENCOUNTER — Ambulatory Visit
Admission: RE | Admit: 2016-08-30 | Discharge: 2016-08-30 | Disposition: A | Discharge status: Home / Self Care | Payer: BLUE CROSS/BLUE SHIELD | Source: Ambulatory Visit | Attending: Otolaryngology | Admitting: Otolaryngology

## 2016-08-30 DIAGNOSIS — E041 Nontoxic single thyroid nodule: Secondary | ICD-10-CM

## 2016-08-31 ENCOUNTER — Encounter: Payer: Self-pay | Admitting: Internal Medicine

## 2016-09-04 ENCOUNTER — Ambulatory Visit: Payer: BLUE CROSS/BLUE SHIELD | Admitting: Psychology

## 2016-09-05 ENCOUNTER — Other Ambulatory Visit: Payer: Self-pay | Admitting: Otolaryngology

## 2016-09-05 DIAGNOSIS — E041 Nontoxic single thyroid nodule: Secondary | ICD-10-CM

## 2016-09-14 ENCOUNTER — Ambulatory Visit: Payer: BLUE CROSS/BLUE SHIELD | Admitting: Endocrinology

## 2016-09-21 ENCOUNTER — Inpatient Hospital Stay
Admission: RE | Admit: 2016-09-21 | Discharge: 2016-09-21 | Disposition: A | Discharge status: Home / Self Care | Payer: Self-pay | Source: Ambulatory Visit | Attending: Otolaryngology | Admitting: Otolaryngology

## 2016-09-28 ENCOUNTER — Other Ambulatory Visit: Payer: BLUE CROSS/BLUE SHIELD

## 2016-10-01 ENCOUNTER — Other Ambulatory Visit: Payer: Self-pay | Admitting: Internal Medicine

## 2016-10-10 ENCOUNTER — Inpatient Hospital Stay
Admission: RE | Admit: 2016-10-10 | Discharge: 2016-10-10 | Disposition: A | Discharge status: Home / Self Care | Payer: Self-pay | Source: Ambulatory Visit | Attending: Otolaryngology | Admitting: Otolaryngology

## 2016-10-19 ENCOUNTER — Other Ambulatory Visit: Payer: Self-pay | Admitting: Internal Medicine

## 2016-10-19 NOTE — Telephone Encounter (Signed)
Pt called requesting a refill on his traMADol (ULTRAM) 50 MG tablet. Please advise, thank you!  Pharmacy - [facility] [phone][facility][id number] - [address], [place] - [address] AT [facility].  Call pt @ [phone]

## 2016-10-20 NOTE — Telephone Encounter (Signed)
Per last refill note, pt was given 30 day supply and instructed to f/u before more refills.  Needs to be seen.

## 2016-10-20 NOTE — Telephone Encounter (Signed)
Refilled: 07/21/2016 Last OV: 08/28/2016 Next OV: f/u not scheduled
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
## 2016-10-20 NOTE — Telephone Encounter (Signed)
LMTCB

## 2016-10-25 MED ORDER — TRAMADOL HCL 50 MG PO TABS: 50.0000 mg | ORAL_TABLET | Freq: Four times a day (QID) | ORAL | 0 refills | Status: DC | PRN

## 2016-10-25 NOTE — Telephone Encounter (Signed)
Pt stated that he will need an appt for next week, the appt provided will not work for pt  Pt contact [phone] -a message can be left on voicemail

## 2016-10-25 NOTE — Telephone Encounter (Signed)
Will fill for 30 days, yes you can use Friday 4:30 appt

## 2016-10-25 NOTE — Telephone Encounter (Signed)
LMTCB.   Will you please schedule pt appt for 10/27/2016 @ 4:30pm. Thank You

## 2016-10-26 ENCOUNTER — Ambulatory Visit
Admission: RE | Admit: 2016-10-26 | Discharge: 2016-10-26 | Disposition: A | Discharge status: Home / Self Care | Payer: BLUE CROSS/BLUE SHIELD | Source: Ambulatory Visit | Attending: Otolaryngology | Admitting: Otolaryngology

## 2016-10-26 ENCOUNTER — Other Ambulatory Visit (HOSPITAL_COMMUNITY)
Admission: RE | Admit: 2016-10-26 | Discharge: 2016-10-26 | Disposition: A | Discharge status: Home / Self Care | Payer: BLUE CROSS/BLUE SHIELD | Source: Ambulatory Visit | Attending: Diagnostic Radiology | Admitting: Diagnostic Radiology

## 2016-10-26 DIAGNOSIS — E041 Nontoxic single thyroid nodule: Secondary | ICD-10-CM | POA: Diagnosis not present

## 2016-10-26 NOTE — Telephone Encounter (Signed)
Please advise for new appt time, thanks

## 2016-10-26 NOTE — Telephone Encounter (Signed)
yes

## 2016-10-26 NOTE — Telephone Encounter (Signed)
You have a 6: 30 on Monday patient stated they cannot come At the 4:30 time.

## 2016-10-27 ENCOUNTER — Telehealth: Payer: Self-pay | Admitting: Endocrinology

## 2016-10-27 NOTE — Telephone Encounter (Signed)
Patient called in reference to appointment with Dr. Everardo AllEllison on Tuesday 10/31/16. Patient wanted make sure Dr. Everardo AllEllison received results that were faxed [facility] for a nodule biopsy on thyroid. Please call patient and advise. OK to leave message.

## 2016-10-27 NOTE — Telephone Encounter (Signed)
See below

## 2016-10-30 NOTE — Telephone Encounter (Signed)
Pt is aware we have the imaging results

## 2016-10-31 ENCOUNTER — Other Ambulatory Visit (HOSPITAL_COMMUNITY)
Admission: RE | Admit: 2016-10-31 | Discharge: 2016-10-31 | Disposition: A | Discharge status: Home / Self Care | Payer: BLUE CROSS/BLUE SHIELD | Source: Ambulatory Visit | Attending: Endocrinology | Admitting: Endocrinology

## 2016-10-31 ENCOUNTER — Ambulatory Visit (INDEPENDENT_AMBULATORY_CARE_PROVIDER_SITE_OTHER): Payer: BLUE CROSS/BLUE SHIELD | Admitting: Endocrinology

## 2016-10-31 ENCOUNTER — Ambulatory Visit (INDEPENDENT_AMBULATORY_CARE_PROVIDER_SITE_OTHER): Payer: BLUE CROSS/BLUE SHIELD | Admitting: Internal Medicine

## 2016-10-31 ENCOUNTER — Encounter: Payer: Self-pay | Admitting: Internal Medicine

## 2016-10-31 ENCOUNTER — Encounter: Payer: Self-pay | Admitting: Endocrinology

## 2016-10-31 VITALS — BP 122/80 | HR 83 | Wt 201.4 lb

## 2016-10-31 VITALS — BP 138/86 | HR 95 | Temp 98.1°F | Resp 16 | Ht 71.0 in | Wt 201.6 lb

## 2016-10-31 DIAGNOSIS — G2581 Restless legs syndrome: Secondary | ICD-10-CM

## 2016-10-31 DIAGNOSIS — E042 Nontoxic multinodular goiter: Secondary | ICD-10-CM | POA: Insufficient documentation

## 2016-10-31 DIAGNOSIS — R419 Unspecified symptoms and signs involving cognitive functions and awareness: Secondary | ICD-10-CM

## 2016-10-31 DIAGNOSIS — E119 Type 2 diabetes mellitus without complications: Secondary | ICD-10-CM | POA: Diagnosis not present

## 2016-10-31 DIAGNOSIS — F1010 Alcohol abuse, uncomplicated: Secondary | ICD-10-CM

## 2016-10-31 DIAGNOSIS — M109 Gout, unspecified: Secondary | ICD-10-CM

## 2016-10-31 DIAGNOSIS — R232 Flushing: Secondary | ICD-10-CM

## 2016-10-31 DIAGNOSIS — I10 Essential (primary) hypertension: Secondary | ICD-10-CM | POA: Diagnosis not present

## 2016-10-31 DIAGNOSIS — M130 Polyarthritis, unspecified: Secondary | ICD-10-CM

## 2016-10-31 DIAGNOSIS — E785 Hyperlipidemia, unspecified: Secondary | ICD-10-CM | POA: Diagnosis not present

## 2016-10-31 LAB — COMPREHENSIVE METABOLIC PANEL
ALT: 55 U/L — AB (ref 0–53)
AST: 40 U/L — AB (ref 0–37)
Albumin: 4.1 g/dL (ref 3.5–5.2)
Alkaline Phosphatase: 67 U/L (ref 39–117)
BILIRUBIN TOTAL: 0.9 mg/dL (ref 0.2–1.2)
BUN: 10 mg/dL (ref 6–23)
CHLORIDE: 98 meq/L (ref 96–112)
CO2: 28 meq/L (ref 19–32)
Calcium: 9.8 mg/dL (ref 8.4–10.5)
Creatinine, Ser: 0.83 mg/dL (ref 0.40–1.50)
GFR: 102.39 mL/min (ref >60.00)
GLUCOSE: 129 mg/dL — AB (ref 70–99)
Potassium: 4.1 mEq/L (ref 3.5–5.1)
Sodium: 135 mEq/L (ref 135–145)
Total Protein: 7.3 g/dL (ref 6.0–8.3)

## 2016-10-31 LAB — CBC WITH DIFFERENTIAL/PLATELET
BASOS ABS: 0 10*3/uL (ref 0.0–0.1)
Basophils Relative: 0.5 % (ref 0.0–3.0)
EOS ABS: 0.5 10*3/uL (ref 0.0–0.7)
Eosinophils Relative: 7.4 % — ABNORMAL HIGH (ref 0.0–5.0)
HCT: 44.6 % (ref 39.0–52.0)
Hemoglobin: 15 g/dL (ref 13.0–17.0)
LYMPHS ABS: 1.1 10*3/uL (ref 0.7–4.0)
Lymphocytes Relative: 15.6 % (ref 12.0–46.0)
MCHC: 33.5 g/dL (ref 30.0–36.0)
MCV: 95.6 fl (ref 78.0–100.0)
Monocytes Absolute: 0.9 10*3/uL (ref 0.1–1.0)
Monocytes Relative: 11.8 % (ref 3.0–12.0)
NEUTROS ABS: 4.7 10*3/uL (ref 1.4–7.7)
NEUTROS PCT: 64.7 % (ref 43.0–77.0)
PLATELETS: 217 10*3/uL (ref 150.0–400.0)
RBC: 4.67 Mil/uL (ref 4.22–5.81)
RDW: 14.7 % (ref 11.5–15.5)
WBC: 7.2 10*3/uL (ref 4.0–10.5)

## 2016-10-31 LAB — LIPID PANEL
CHOL/HDL RATIO: 3
Cholesterol: 206 mg/dL — ABNORMAL HIGH (ref 0–200)
HDL: 67.5 mg/dL (ref >39.00)
LDL CALC: 102 mg/dL — AB (ref 0–99)
NONHDL: 138.47
Triglycerides: 184 mg/dL — ABNORMAL HIGH (ref 0.0–149.0)
VLDL: 36.8 mg/dL (ref 0.0–40.0)

## 2016-10-31 LAB — URIC ACID: Uric Acid, Serum: 6.3 mg/dL (ref 4.0–7.8)

## 2016-10-31 LAB — TSH: TSH: 2.22 u[IU]/mL (ref 0.35–4.50)

## 2016-10-31 MED ORDER — PANTOPRAZOLE SODIUM 40 MG PO TBEC: 40.0000 mg | DELAYED_RELEASE_TABLET | Freq: Two times a day (BID) | ORAL | 5 refills | Status: DC

## 2016-10-31 MED ORDER — SILDENAFIL CITRATE 20 MG PO TABS: 20.0000 mg | ORAL_TABLET | Freq: Three times a day (TID) | ORAL | 0 refills | Status: DC

## 2016-10-31 NOTE — Patient Instructions (Addendum)
You can try taking 50 mg trazodone   plus 0.5 mg clonazepam at night for insomnia if you are not drinking alcohol.   If your iron stores are low,  I will recommend iron supplementation  If not,  I will try an alternative medication for insomnia that treats 'RESTLESS LEGS"   REFERRAL FOR ADD TESTING is in process     RTC 3 MONTHS
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
## 2016-10-31 NOTE — Patient Instructions (Addendum)
blood tests are requested for you today.  We'll let you know about the results.   We'll let you know about the biopsy result.  If as expected, no cancer is found, please come back for a follow-up appointment in 6-12 months.

## 2016-10-31 NOTE — Progress Notes (Signed)
Subjective:    Patient ID: Jeff [name], male    DOB: Nov 30, 1960, 56 y.o.   MRN: [id number]  HPI Pt is referred by Dr Darrick Huntsman, for thyroid nodule.  Pt was noted to have a nodule at the thyroid in 2018, incidentally on a chest CT.  He is unaware of ever having had thyroid problems in the past.  He has no h/o XRT or surgery to the neck.  He has slight swelling at the right ant neck, but no assoc pain.   Past Medical History:  Diagnosis Date  . Arthritis    KNEES  . Cataract    REMOVED BILATERAL  . Claustrophobia   . GERD (gastroesophageal reflux disease)    with certain foods  . Glaucoma (increased eye pressure)   . Headache(784.0)    07/11/13- none in a long time  . Hepatic steatosis   . Hyperglycemia   . Hyperlipidemia   . Hypertension    no meds  . Sleep apnea     Past Surgical History:  Procedure Laterality Date  . CATARACT EXTRACTION W/PHACO Left 07/16/2013   Procedure: CATARACT EXTRACTION PHACO AND INTRAOCULAR LENS PLACEMENT (IOC) LEFT EYE;  Surgeon: Chalmers Guest, MD;  Location: [facility] OR;  Service: Ophthalmology;  Laterality: Left;  . CATARACT EXTRACTION W/PHACO Right 10/29/2013   Procedure: CATARACT EXTRACTION PHACO AND INTRAOCULAR LENS PLACEMENT (IOC);  Surgeon: Chalmers Guest, MD;  Location: [facility] OR;  Service: Ophthalmology;  Laterality: Right;  . EYE SURGERY     2 on left eye  . FINGER SURGERY Right   . MINI SHUNT INSERTION  03/09/2011   Procedure: INSERTION OF MINI SHUNT;  Surgeon: Chalmers Guest, MD;  Location: [facility] OR;  Service: Ophthalmology;  Laterality: Left;    Social History   Social History  . Marital status: Married    Spouse name: N/A  . Number of children: 0  . Years of education: college3   Occupational History  . [employer]   Social History Main Topics  . Smoking status: Never Smoker  . Smokeless tobacco: Never Used  . Alcohol use 3.6 - 4.8 oz/week    6 - 8 Cans of beer per week     Comment: daily  . Drug use: No  . Sexual activity: Not on file     Other Topics Concern  . Not on file   Social History Narrative  . No narrative on file    Current Outpatient Prescriptions on File Prior to Visit  Medication Sig Dispense Refill  . allopurinol (ZYLOPRIM) 300 MG tablet TAKE 1/2 TABLETS (150 MG TOTAL) BY MOUTH DAILY. 90 tablet 0  . buPROPion (WELLBUTRIN SR) 150 MG 12 hr tablet Take 1 tablet (150 mg total) by mouth 2 (two) times daily. 60 tablet 2  . chlorhexidine (PERIDEX) 0.12 % solution RINSE WITH 1 CAPFUL TWICE A DAY  0  . clonazePAM (KLONOPIN) 0.5 MG tablet Take 1 tablet (0.5 mg total) by mouth at bedtime. As needed for insomnia 30 tablet 5  . colchicine 0.6 MG tablet TAKE HOURLY UNTIL GOUT IMPROVED/DIARRHEA, ON FIRST DAY OF TREATMENT.TAKE DAILY THEREAFTER AS NEEDED 30 tablet 0  . colchicine 0.6 MG tablet TAKE HOURLY UNTIL GOUT IMPROVED/DIARRHEA, ON FIRST DAY OF TREATMENT.TAKE DAILY THEREAFTER AS NEEDED 30 tablet 3  . cyclobenzaprine (FLEXERIL) 10 MG tablet Take 1 tablet (10 mg total) by mouth 2 (two) times daily as needed for muscle spasms. 20 tablet 0  . dorzolamide-timolol (COSOPT) 22.3-6.8 MG/ML ophthalmic solution PLACE 1 DROP  INTO THE RIGHT EYE 2 (TWO) TIMES DAILY.  11  . losartan-hydrochlorothiazide (HYZAAR) 100-25 MG tablet TAKE 1 TABLET BY MOUTH DAILY. 90 tablet 1  . metoprolol succinate (TOPROL-XL) 25 MG 24 hr tablet TAKE 1 TABLET (25 MG TOTAL) BY MOUTH DAILY. 90 tablet 1  . oxyCODONE-acetaminophen (PERCOCET/ROXICET) 5-325 MG tablet Take 1 tablet by mouth every 4 (four) hours as needed for severe pain. 15 tablet 0  . PRED FORTE 1 % ophthalmic suspension   6  . simvastatin (ZOCOR) 20 MG tablet Take 1 tablet (20 mg total) by mouth at bedtime. 90 tablet 0  . tamsulosin (FLOMAX) 0.4 MG CAPS capsule Take 1 capsule (0.4 mg total) by mouth daily. 90 capsule 3  . tiZANidine (ZANAFLEX) 4 MG tablet TAKE 1 TABLET (4 MG TOTAL) BY MOUTH AT BEDTIME. 90 tablet 1  . traMADol (ULTRAM) 50 MG tablet Take 1 tablet (50 mg total) by mouth every 6  (six) hours as needed. 120 tablet 0  . traZODone (DESYREL) 50 MG tablet Take 1 tablet (50 mg total) by mouth daily as needed for sleep. 90 tablet 1   Current Facility-Administered Medications on File Prior to Visit  Medication Dose Route Frequency Provider Last Rate Last Dose  . lidocaine (XYLOCAINE) 1 % (with pres) injection 4 mL  4 mL Other Once Sherlene Shamsullo, Teresa L, MD      . triamcinolone acetonide (KENALOG-40) injection 40 mg  40 mg Intra-articular Once Sherlene Shamsullo, Teresa L, MD        Allergies  Allergen Reactions  . Keflex [Cephalexin] Rash    Family History  Problem Relation Age of Onset  . Asthma Mother   . Hyperlipidemia Mother   . Heart disease Mother   . Hypertension Mother   . Liver cancer Mother   . Thyroid disease Mother   . Asthma Father   . Hyperlipidemia Father   . Heart disease Father   . Stroke Father   . Hypertension Father   . Aneurysm Father   . Aneurysm Brother        non smoker   . Early death Brother   . Asthma Paternal Aunt   . Heart disease Sister   . Early death Sister   . Migraines Neg Hx     BP 122/80   Pulse 83   Wt 201 lb 6.4 oz (91.4 kg)   SpO2 97%   BMI 28.09 kg/m    Review of Systems Denies weight change, visual loss, chest pain, sob, dysphagia, diarrhea, itching, easy bruising, numbness, and rhinorrhea.  He has intermitt headache, depression, flushing, cough, hoarseness, weight gain, and cold intolerance.      Objective:   Physical Exam VS: see vs page GEN: no distress HEAD: head: no deformity eyes: no periorbital swelling, no proptosis external nose and ears are normal mouth: no lesion seen NECK: right thyroid nodule is easily palpable. CHEST WALL: no deformity LUNGS: clear to auscultation CV: reg rate and rhythm, no murmur ABD: abdomen is soft, nontender.  no hepatosplenomegaly.  not distended.  no hernia MUSCULOSKELETAL: muscle bulk and strength are grossly normal.  no obvious joint swelling.  gait is normal and steady.    PULSES: no carotid bruit NEURO:  cn 2-12 grossly intact.   readily moves all 4's.  sensation is intact to touch on all 4's SKIN:  Normal texture and temperature.  No rash or suspicious lesion is visible.   NODES:  None palpable at the neck PSYCH: alert, well-oriented.  Does not appear  anxious nor depressed.  Lab Results  Component Value Date   TSH 2.22 10/31/2016   bx cytol: SCANT FOLLICULAR EPITHELIUM PRESENT (BETHESDA CATEGORY I).  [place]: Thyromegaly with solitary 2.3 cm moderately suspicious right nodule.   I have reviewed outside records, and summarized: Pt was noted to have thyroid nodule.  Because bx cytology was nondiagnostic, she was ref here for an opinion.  thyroid needle bx: consent obtained, signed form on chart The area is first sprayed with cooling agent local: xylocaine 2%, with epinephrine prep: alcohol pad 4 bxs are done with 25 and 27g needles. no complications     Assessment & Plan:  Thyroid nodule, new to me, uncertain etiology Flushing: check calcitonin.   Patient Instructions  blood tests are requested for you today.  We'll let you know about the results.   We'll let you know about the biopsy result.  If as expected, no cancer is found, please come back for a follow-up appointment in 6-12 months.

## 2016-11-01 DIAGNOSIS — E041 Nontoxic single thyroid nodule: Secondary | ICD-10-CM | POA: Insufficient documentation

## 2016-11-02 DIAGNOSIS — G2581 Restless legs syndrome: Secondary | ICD-10-CM | POA: Insufficient documentation

## 2016-11-02 LAB — IRON,TIBC AND FERRITIN PANEL
%SAT: 25 % (calc) (ref 15–60)
FERRITIN: 269 ng/mL (ref 20–380)
IRON: 98 ug/dL (ref 50–180)
TIBC: 397 ug/dL (ref 250–425)

## 2016-11-02 MED ORDER — ROPINIROLE HCL 0.25 MG PO TABS: 0.2500 mg | ORAL_TABLET | Freq: Every day | ORAL | 0 refills | Status: DC

## 2016-11-02 NOTE — Assessment & Plan Note (Signed)
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 Multiple contributors including alcohol abuse and disrupted sleep.  Referral to [facility]

## 2016-11-02 NOTE — Assessment & Plan Note (Signed)
Ongoing . Abstinence and AA attendance strongly advised.

## 2016-11-02 NOTE — Assessment & Plan Note (Signed)
Iron stores are normal .  Trial of requip  Lab Results  Component Value Date   IRON 98 10/31/2016   TIBC 397 10/31/2016   FERRITIN 269 10/31/2016   Lab Results  Component Value Date   WBC 7.2 10/31/2016   HGB 15.0 10/31/2016   HCT 44.6 10/31/2016   MCV 95.6 10/31/2016   PLT 217.0 10/31/2016

## 2016-11-04 ENCOUNTER — Encounter: Payer: Self-pay | Admitting: Internal Medicine

## 2016-11-04 NOTE — Assessment & Plan Note (Signed)
His  fasting  glucose is elevated and  diagnostic of diabetes;  He does  not need medications at this time,  But should retrunin 1-2 months  For   next office visit .  We will  Discuss how we can address  diagnosis  with a  lifestyle changes including diet , weight loss  and regular  exercise .

## 2016-11-04 NOTE — Assessment & Plan Note (Signed)
Iron deficiency ruled out  Trial of ropinirol at starting dose.

## 2016-11-28 ENCOUNTER — Other Ambulatory Visit: Payer: Self-pay

## 2016-11-28 MED ORDER — SIMVASTATIN 20 MG PO TABS: 20.0000 mg | ORAL_TABLET | Freq: Every day | ORAL | 1 refills | Status: DC

## 2016-12-10 ENCOUNTER — Other Ambulatory Visit: Payer: Self-pay | Admitting: Internal Medicine

## 2016-12-14 ENCOUNTER — Other Ambulatory Visit: Payer: Self-pay

## 2016-12-14 MED ORDER — BUPROPION HCL ER (SR) 150 MG PO TB12: 150.0000 mg | ORAL_TABLET | Freq: Two times a day (BID) | ORAL | 0 refills | Status: DC

## 2016-12-18 ENCOUNTER — Ambulatory Visit: Payer: BLUE CROSS/BLUE SHIELD | Admitting: Psychology

## 2016-12-20 ENCOUNTER — Other Ambulatory Visit: Payer: Self-pay | Admitting: Internal Medicine

## 2016-12-22 ENCOUNTER — Encounter: Payer: Self-pay | Admitting: *Deleted

## 2016-12-22 MED ORDER — LOSARTAN POTASSIUM-HCTZ 100-25 MG PO TABS: 1.0000 | ORAL_TABLET | Freq: Every day | ORAL | 1 refills | Status: DC

## 2016-12-22 NOTE — Telephone Encounter (Signed)
Copied from CRM [phone]#[id number]. Topic: Inquiry >> Dec 22, 2016  3:56 PM Jeff BergeronBarksdale, Jeff B wrote: Reason for CRM: PT called and spoke with the pharmacy and they made an error, its not the ZOLOFT Rx but its the [place]in that he is needing, and he would like to see if he could get it switched from a 30 day supply to a 90 day supply, please advise

## 2016-12-22 NOTE — Telephone Encounter (Signed)
Set rx to pharmacy

## 2017-01-30 ENCOUNTER — Other Ambulatory Visit: Payer: Self-pay | Admitting: Internal Medicine

## 2017-02-22 ENCOUNTER — Other Ambulatory Visit: Payer: Self-pay | Admitting: Internal Medicine

## 2017-02-22 MED ORDER — TRAMADOL HCL 50 MG PO TABS: 50.0000 mg | ORAL_TABLET | Freq: Four times a day (QID) | ORAL | 0 refills | Status: DC | PRN

## 2017-02-22 NOTE — Telephone Encounter (Signed)
Refill for 30 days only.  OFFICE VISIT NEEDED prior to any more refills 

## 2017-02-22 NOTE — Telephone Encounter (Signed)
Please advise 

## 2017-02-22 NOTE — Telephone Encounter (Signed)
Refilled: 10/25/2016 Last OV: 10/31/2016 Next OV: 04/13/2017

## 2017-02-22 NOTE — Telephone Encounter (Signed)
Copied from CRM [phone]. Topic: Quick Communication - Rx Refill/Question >> Feb 22, 2017 12:53 PM Crist InfanteHarrald, Kathy J wrote: Pt request refill   traMADol (ULTRAM) 50 MG tablet  [id number] [place] - [address] [facility] [phone] (Phone) [phone] (Fax)  Pt has an appt 04/13/2017

## 2017-02-23 NOTE — Telephone Encounter (Signed)
Printed, signed and faxed.  

## 2017-03-06 ENCOUNTER — Telehealth: Payer: Self-pay

## 2017-03-06 NOTE — Telephone Encounter (Signed)
PA was sent to plan today for pt's tramadol and was approved. Pt and pharmacy have been notified.

## 2017-03-09 ENCOUNTER — Other Ambulatory Visit: Payer: Self-pay | Admitting: Internal Medicine

## 2017-03-09 DIAGNOSIS — R1314 Dysphagia, pharyngoesophageal phase: Secondary | ICD-10-CM

## 2017-03-09 DIAGNOSIS — K219 Gastro-esophageal reflux disease without esophagitis: Secondary | ICD-10-CM

## 2017-03-13 ENCOUNTER — Other Ambulatory Visit: Payer: Self-pay | Admitting: Internal Medicine

## 2017-04-13 ENCOUNTER — Encounter: Payer: Self-pay | Admitting: Internal Medicine

## 2017-04-13 ENCOUNTER — Ambulatory Visit: Payer: BLUE CROSS/BLUE SHIELD | Admitting: Internal Medicine

## 2017-04-13 VITALS — BP 138/84 | HR 77 | Temp 98.1°F | Resp 15 | Ht 71.0 in | Wt 198.6 lb

## 2017-04-13 DIAGNOSIS — R7301 Impaired fasting glucose: Secondary | ICD-10-CM

## 2017-04-13 DIAGNOSIS — G2581 Restless legs syndrome: Secondary | ICD-10-CM

## 2017-04-13 DIAGNOSIS — J0120 Acute ethmoidal sinusitis, unspecified: Secondary | ICD-10-CM | POA: Diagnosis not present

## 2017-04-13 DIAGNOSIS — E119 Type 2 diabetes mellitus without complications: Secondary | ICD-10-CM

## 2017-04-13 DIAGNOSIS — I10 Essential (primary) hypertension: Secondary | ICD-10-CM | POA: Diagnosis not present

## 2017-04-13 DIAGNOSIS — K760 Fatty (change of) liver, not elsewhere classified: Secondary | ICD-10-CM | POA: Diagnosis not present

## 2017-04-13 DIAGNOSIS — E042 Nontoxic multinodular goiter: Secondary | ICD-10-CM | POA: Diagnosis not present

## 2017-04-13 LAB — COMPREHENSIVE METABOLIC PANEL
ALBUMIN: 4.1 g/dL (ref 3.5–5.2)
ALK PHOS: 59 U/L (ref 39–117)
ALT: 38 U/L (ref 0–53)
AST: 32 U/L (ref 0–37)
BILIRUBIN TOTAL: 0.5 mg/dL (ref 0.2–1.2)
BUN: 14 mg/dL (ref 6–23)
CALCIUM: 9.5 mg/dL (ref 8.4–10.5)
CO2: 30 mEq/L (ref 19–32)
CREATININE: 0.92 mg/dL (ref 0.40–1.50)
Chloride: 99 mEq/L (ref 96–112)
GFR: 90.77 mL/min (ref >60.00)
Glucose, Bld: 112 mg/dL — ABNORMAL HIGH (ref 70–99)
Potassium: 4.1 mEq/L (ref 3.5–5.1)
Sodium: 135 mEq/L (ref 135–145)
TOTAL PROTEIN: 7.2 g/dL (ref 6.0–8.3)

## 2017-04-13 LAB — HEMOGLOBIN A1C: Hgb A1c MFr Bld: 5.8 % (ref 4.6–6.5)

## 2017-04-13 MED ORDER — PREDNISONE 10 MG PO TABS: ORAL_TABLET | ORAL | 0 refills | Status: DC

## 2017-04-13 MED ORDER — ROPINIROLE HCL 0.5 MG PO TABS: 0.5000 mg | ORAL_TABLET | Freq: Every day | ORAL | 1 refills | Status: DC

## 2017-04-13 MED ORDER — LEVOFLOXACIN 500 MG PO TABS: 500.0000 mg | ORAL_TABLET | Freq: Every day | ORAL | 0 refills | Status: DC

## 2017-04-13 NOTE — Progress Notes (Signed)
Subjective:  Patient ID: Jeff [name], male    DOB: [birth year]  Age: 57 y.o. MRN: [id number]  CC: The primary encounter diagnosis was Impaired fasting glucose. Diagnoses of Essential hypertension, Acute non-recurrent ethmoidal sinusitis, Restless legs syndrome, Multiple thyroid nodules, Type 2 diabetes mellitus without complication, without long-term current use of insulin ([place]), and Hepatic steatosis were also pertinent to this visit.  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  Saw a neuro ophthalmologist at [facility] .  Was screened for GCA given temporal headaches.  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  RPR negative .  Was having severe pain in eye, which the MD ignored,  Regular eye doctor found a suture that needed removal   Bronchitis:  Productive sounding cough for a few weeks,  Sinuses have draining and feeling full /painful for several weeks,  Works for [employer]  And spends most of every day going  in and out of the cold,  taking b12 and vitamin Denies fevers,  Chills,  body aches.    Saw ENT in Sept for hoarseness and thyroid nodule  Was biopsied and Benign  Using clonazepam prn not daily  Using requip  for RLs,  Symptoms not controlled.  Not sleeping well   Eating better,  Exercising rarely .  Still drinking alcohol but not more than 2 drinks per day   Outpatient Medications Prior to Visit  Medication Sig Dispense Refill  . allopurinol (ZYLOPRIM) 300 MG tablet TAKE 1/2 TABLETS (150 MG TOTAL) BY MOUTH DAILY. 90 tablet 0  . buPROPion (WELLBUTRIN SR) 150 MG 12 hr tablet Take 1 tablet (150 mg total) by mouth 2 (two) times daily. 180 tablet 0  . clonazePAM (KLONOPIN) 0.5 MG tablet Take 1 tablet (0.5 mg total) by mouth at bedtime. As needed for insomnia 30 tablet 5  . colchicine 0.6 MG tablet TAKE HOURLY UNTIL GOUT IMPROVED/DIARRHEA, ON FIRST DAY OF TREATMENT.TAKE DAILY  THEREAFTER AS NEEDED 30 tablet 3  . cyclobenzaprine (FLEXERIL) 10 MG tablet Take 1 tablet (10 mg total) by mouth 2 (two) times daily as needed for muscle spasms. 20 tablet 0  . dorzolamide-timolol (COSOPT) 22.3-6.8 MG/ML ophthalmic solution PLACE 1 DROP INTO THE RIGHT EYE 2 (TWO) TIMES DAILY.  11  . losartan-hydrochlorothiazide (HYZAAR) 100-25 MG tablet Take 1 tablet by mouth daily. 90 tablet 1  . metoprolol succinate (TOPROL-XL) 25 MG 24 hr tablet TAKE 1 TABLET (25 MG TOTAL) BY MOUTH DAILY. 90 tablet 1  . pantoprazole (PROTONIX) 40 MG tablet Take 1 tablet (40 mg total) by mouth 2 (two) times daily. 60 tablet 5  . PRED FORTE 1 % ophthalmic suspension   6  . sildenafil (REVATIO) 20 MG tablet Take 1 tablet (20 mg total) by mouth 3 (three) times daily. 30 tablet 0  . simvastatin (ZOCOR) 20 MG tablet Take 1 tablet (20 mg total) by mouth at bedtime. 90 tablet 1  . tamsulosin (FLOMAX) 0.4 MG CAPS capsule Take 1 capsule (0.4 mg total) by mouth daily. 90 capsule 3  . tiZANidine (ZANAFLEX) 4 MG tablet TAKE 1 TABLET (4 MG TOTAL) BY MOUTH AT BEDTIME. 90 tablet 1  . traMADol (ULTRAM) 50 MG tablet Take 1 tablet (50 mg total) by mouth every 6 (six) hours as needed. 120 tablet 0  . traZODone (DESYREL) 50 MG tablet Take 1 tablet (50 mg total) by  mouth daily as needed for sleep. 90 tablet 1  . rOPINIRole (REQUIP) 0.25 MG tablet TAKE 1 TABLET (0.25 MG TOTAL) BY MOUTH AT BEDTIME. 90 tablet 0  . chlorhexidine (PERIDEX) 0.12 % solution RINSE WITH 1 CAPFUL TWICE A DAY  0  . colchicine 0.6 MG tablet TAKE HOURLY UNTIL GOUT IMPROVED/DIARRHEA, ON FIRST DAY OF TREATMENT.TAKE DAILY THEREAFTER AS NEEDED (Patient not taking: Reported on 04/13/2017) 30 tablet 0  . oxyCODONE-acetaminophen (PERCOCET/ROXICET) 5-325 MG tablet Take 1 tablet by mouth every 4 (four) hours as needed for severe pain. (Patient not taking: Reported on 04/13/2017) 15 tablet 0   Facility-Administered Medications Prior to Visit  Medication Dose Route Frequency  Provider Last Rate Last Dose  . lidocaine (XYLOCAINE) 1 % (with pres) injection 4 mL  4 mL Other Once Crecencio Mc, MD      . triamcinolone acetonide (KENALOG-40) injection 40 mg  40 mg Intra-articular Once Crecencio Mc, MD        Review of Systems;  Patient denies headache, fevers, malaise, unintentional weight loss, skin rash, eye pain, sinus congestion and sinus pain, sore throat, dysphagia,  hemoptysis , cough, dyspnea, wheezing, chest pain, palpitations, orthopnea, edema, abdominal pain, nausea, melena, diarrhea, constipation, flank pain, dysuria, hematuria, urinary  Frequency, nocturia, numbness, tingling, seizures,  Focal weakness, Loss of consciousness,  Tremor, insomnia, depression, anxiety, and suicidal ideation.      Objective:  BP 138/84 (BP Location: Left Arm, Patient Position: Sitting, Cuff Size: Normal)   Pulse 77   Temp 98.1 F (36.7 C) (Oral)   Resp 15   Ht '5\' 11"'$  (1.803 m)   Wt 198 lb 9.6 oz (90.1 kg)   SpO2 91%   BMI 27.70 kg/m   BP Readings from Last 3 Encounters:  04/13/17 138/84  10/31/16 138/86  10/31/16 122/80    Wt Readings from Last 3 Encounters:  04/13/17 198 lb 9.6 oz (90.1 kg)  10/31/16 201 lb 9.6 oz (91.4 kg)  10/31/16 201 lb 6.4 oz (91.4 kg)    General appearance: alert, cooperative and appears stated age Ears: normal TM's and external ear canals both ears Throat: lips, mucosa, and tongue normal; teeth and gums normal Neck: no adenopathy, no carotid bruit, supple, symmetrical, trachea midline and thyroid not enlarged, symmetric, no tenderness/mass/nodules Back: symmetric, no curvature. ROM normal. No CVA tenderness. Lungs: clear to auscultation bilaterally Heart: regular rate and rhythm, S1, S2 normal, no murmur, click, rub or gallop Abdomen: soft, non-tender; bowel sounds normal; no masses,  no organomegaly Pulses: 2+ and symmetric Skin: Skin color, texture, turgor normal. No rashes or lesions Lymph nodes: Cervical, supraclavicular,  and axillary nodes normal.  Lab Results  Component Value Date   HGBA1C 5.8 04/13/2017   HGBA1C 5.7 07/13/2016   HGBA1C 5.4 07/09/2015    Lab Results  Component Value Date   CREATININE 0.92 04/13/2017   CREATININE 0.83 10/31/2016   CREATININE 0.97 08/28/2016    Lab Results  Component Value Date   WBC 7.2 10/31/2016   HGB 15.0 10/31/2016   HCT 44.6 10/31/2016   PLT 217.0 10/31/2016   GLUCOSE 112 (H) 04/13/2017   CHOL 206 (H) 10/31/2016   TRIG 184.0 (H) 10/31/2016   HDL 67.50 10/31/2016   LDLDIRECT 144.0 08/06/2012   LDLCALC 102 (H) 10/31/2016   ALT 38 04/13/2017   AST 32 04/13/2017   NA 135 04/13/2017   K 4.1 04/13/2017   CL 99 04/13/2017   CREATININE 0.92 04/13/2017   BUN 14 04/13/2017  CO2 30 04/13/2017   TSH 2.22 10/31/2016   PSA 2.55 07/13/2016   HGBA1C 5.8 04/13/2017   MICROALBUR 1.3 05/14/2014    No results found.  Assessment & Plan:   Problem List Items Addressed This Visit    HTN (hypertension)    Well controlled on current regimen. Renal function stable, no changes today.  Lab Results  Component Value Date   CREATININE 0.92 04/13/2017   Lab Results  Component Value Date   NA 135 04/13/2017   K 4.1 04/13/2017   CL 99 04/13/2017   CO2 30 04/13/2017         Hepatic steatosis    Continued to advise him  to sustain from alcohol. Hep A & B vaccines offered       Type 2 diabetes mellitus without complications (HCC)    His  fasting  Glucose was elevated and  diagnostic of diabetes in Sept 2018 ,  But his A1c has been < 6.5 repeatedly . ;  He does  not need medications at this time,  But needs to adopt healthy  lifestyle changes including diet , weight loss  and regular  exercise .      Multiple thyroid nodules    Thyroid biopsy in Sept benign      Restless legs syndrome    Will increase dose of Requip       Acute non-recurrent ethmoidal sinusitis    Given chronicity of symptoms, development of facial pain and exam consistent with  bacterial URI,  Will treat with empiric antibiotics, decongestants, and saline lavage.        Relevant Medications   levofloxacin (LEVAQUIN) 500 MG tablet   predniSONE (DELTASONE) 10 MG tablet    Other Visit Diagnoses    Impaired fasting glucose    -  Primary   Relevant Orders   Hemoglobin A1c (Completed)   Comprehensive metabolic panel (Completed)   Microalbumin / creatinine urine ratio    A total of 25 minutes of face to face time was spent with patient more than half of which was spent in counselling about the above mentioned conditions  and coordination of care   I have discontinued Gwyndolyn Saxon K. Delia's chlorhexidine and oxyCODONE-acetaminophen. I have also changed his rOPINIRole. Additionally, I am having him start on levofloxacin and predniSONE. Lastly, I am having him maintain his tiZANidine, PRED FORTE, allopurinol, traZODone, tamsulosin, dorzolamide-timolol, cyclobenzaprine, colchicine, clonazePAM, sildenafil, pantoprazole, simvastatin, buPROPion, losartan-hydrochlorothiazide, traMADol, and metoprolol succinate. We will continue to administer triamcinolone acetonide and lidocaine.  Meds ordered this encounter  Medications  . rOPINIRole (REQUIP) 0.5 MG tablet    Sig: Take 1 tablet (0.5 mg total) by mouth at bedtime.    Dispense:  90 tablet    Refill:  1  . levofloxacin (LEVAQUIN) 500 MG tablet    Sig: Take 1 tablet (500 mg total) by mouth daily.    Dispense:  10 tablet    Refill:  0  . predniSONE (DELTASONE) 10 MG tablet    Sig: 6 tablets on Day 1 , then reduce by 1 tablet daily until gone    Dispense:  21 tablet    Refill:  0    Medications Discontinued During This Encounter  Medication Reason  . chlorhexidine (PERIDEX) 0.12 % solution Completed Course  . colchicine 0.6 MG tablet Duplicate  . oxyCODONE-acetaminophen (PERCOCET/ROXICET) 5-325 MG tablet Completed Course  . rOPINIRole (REQUIP) 0.25 MG tablet     Follow-up: Return in about 6 months (around  10/11/2017).  Crecencio Mc, MD

## 2017-04-13 NOTE — Patient Instructions (Addendum)
I have increased your ropinirole for restless legs to 0.5 mg at bedtime.  You can increase this dose 1 mg after 2 weeks if needed.     I am starting you an antibiotic for your persistent sinus infection.  Levaquin 500 mg daily for 10 days .  One tablet daily   Prednisone  6 day taper  To help manage the inflammation      Please take a probiotic ( Align, Floraque or Culturelle) or a serving of yogurt  Daily for for a minimum of 3 weeks to prevent a serious antibiotic associated diarrhea  Called clostridium dificile colitis

## 2017-04-15 DIAGNOSIS — J0120 Acute ethmoidal sinusitis, unspecified: Secondary | ICD-10-CM | POA: Insufficient documentation

## 2017-04-15 NOTE — Assessment & Plan Note (Signed)
Continued to advise him  to sustain from alcohol. Hep A & B vaccines offered

## 2017-04-15 NOTE — Assessment & Plan Note (Signed)
Well controlled on current regimen. Renal function stable, no changes today.  Lab Results  Component Value Date   CREATININE 0.92 04/13/2017   Lab Results  Component Value Date   NA 135 04/13/2017   K 4.1 04/13/2017   CL 99 04/13/2017   CO2 30 04/13/2017

## 2017-04-15 NOTE — Assessment & Plan Note (Signed)
Given chronicity of symptoms, development of facial pain and exam consistent with bacterial URI,  Will treat with empiric antibiotics, decongestants, and saline lavage.   

## 2017-04-15 NOTE — Assessment & Plan Note (Signed)
Will increase dose of Requip

## 2017-04-15 NOTE — Assessment & Plan Note (Signed)
His  fasting  Glucose was elevated and  diagnostic of diabetes in Sept 2018 ,  But his A1c has been < 6.5 repeatedly . ;  He does  not need medications at this time,  But needs to adopt healthy  lifestyle changes including diet , weight loss  and regular  exercise .

## 2017-04-15 NOTE — Assessment & Plan Note (Signed)
Thyroid biopsy in Sept benign

## 2017-06-06 ENCOUNTER — Other Ambulatory Visit: Payer: Self-pay | Admitting: Internal Medicine

## 2017-06-14 ENCOUNTER — Other Ambulatory Visit: Payer: Self-pay | Admitting: Internal Medicine

## 2017-06-15 ENCOUNTER — Telehealth: Payer: Self-pay | Admitting: Internal Medicine

## 2017-06-15 NOTE — Telephone Encounter (Signed)
Copied from CRM [phone]. Topic: Quick Communication - Rx Refill/Question >> Jun 15, 2017  3:34 PM Crist InfanteHarrald, Kathy J wrote: Medication: pantoprazole (PROTONIX) 40 MG tablet Has the patient contacted their pharmacy? yes pt is requesting this be changed to a 90 day due to insurance (he can get free with 90 day)  [id number] [place] [facility] - [address] [facility] [phone] (Phone) [phone] (Fax)  90 day //BID  180 tabs

## 2017-06-18 ENCOUNTER — Other Ambulatory Visit: Payer: Self-pay | Admitting: *Deleted

## 2017-06-18 MED ORDER — PANTOPRAZOLE SODIUM 40 MG PO TBEC: 40.0000 mg | DELAYED_RELEASE_TABLET | Freq: Two times a day (BID) | ORAL | 0 refills | Status: DC

## 2017-06-18 NOTE — Telephone Encounter (Signed)
Rx refilled er protocol- patient to continue per note 04/13/17. Next appointment 07/13/17. Patient request 90 day supply for insurance purposes.

## 2017-06-21 ENCOUNTER — Other Ambulatory Visit: Payer: Self-pay | Admitting: Internal Medicine

## 2017-06-22 NOTE — Telephone Encounter (Signed)
Last OV 04/13/17 and last fill 1/19 180 and no refills.

## 2017-07-13 ENCOUNTER — Ambulatory Visit (INDEPENDENT_AMBULATORY_CARE_PROVIDER_SITE_OTHER): Payer: BLUE CROSS/BLUE SHIELD | Admitting: Internal Medicine

## 2017-07-13 VITALS — BP 108/78 | HR 70 | Temp 97.5°F | Resp 15 | Ht 71.0 in | Wt 194.4 lb

## 2017-07-13 DIAGNOSIS — Z0000 Encounter for general adult medical examination without abnormal findings: Secondary | ICD-10-CM

## 2017-07-13 DIAGNOSIS — F5104 Psychophysiologic insomnia: Secondary | ICD-10-CM

## 2017-07-13 DIAGNOSIS — Z125 Encounter for screening for malignant neoplasm of prostate: Secondary | ICD-10-CM

## 2017-07-13 DIAGNOSIS — R7301 Impaired fasting glucose: Secondary | ICD-10-CM

## 2017-07-13 DIAGNOSIS — R5383 Other fatigue: Secondary | ICD-10-CM

## 2017-07-13 LAB — CBC WITH DIFFERENTIAL/PLATELET
BASOS ABS: 0.1 10*3/uL (ref 0.0–0.1)
Basophils Relative: 1.2 % (ref 0.0–3.0)
Eosinophils Absolute: 0.6 10*3/uL (ref 0.0–0.7)
Eosinophils Relative: 12.5 % — ABNORMAL HIGH (ref 0.0–5.0)
HEMATOCRIT: 43.8 % (ref 39.0–52.0)
HEMOGLOBIN: 15.1 g/dL (ref 13.0–17.0)
LYMPHS PCT: 24.5 % (ref 12.0–46.0)
Lymphs Abs: 1.2 10*3/uL (ref 0.7–4.0)
MCHC: 34.6 g/dL (ref 30.0–36.0)
MCV: 95.6 fl (ref 78.0–100.0)
Monocytes Absolute: 0.7 10*3/uL (ref 0.1–1.0)
Monocytes Relative: 14.3 % — ABNORMAL HIGH (ref 3.0–12.0)
NEUTROS PCT: 47.5 % (ref 43.0–77.0)
Neutro Abs: 2.2 10*3/uL (ref 1.4–7.7)
Platelets: 272 10*3/uL (ref 150.0–400.0)
RBC: 4.58 Mil/uL (ref 4.22–5.81)
RDW: 13.8 % (ref 11.5–15.5)
WBC: 4.7 10*3/uL (ref 4.0–10.5)

## 2017-07-13 LAB — COMPREHENSIVE METABOLIC PANEL
ALBUMIN: 4 g/dL (ref 3.5–5.2)
ALK PHOS: 58 U/L (ref 39–117)
ALT: 29 U/L (ref 0–53)
AST: 30 U/L (ref 0–37)
BUN: 12 mg/dL (ref 6–23)
CALCIUM: 9.4 mg/dL (ref 8.4–10.5)
CHLORIDE: 99 meq/L (ref 96–112)
CO2: 30 mEq/L (ref 19–32)
Creatinine, Ser: 0.93 mg/dL (ref 0.40–1.50)
GFR: 89.56 mL/min (ref >60.00)
Glucose, Bld: 130 mg/dL — ABNORMAL HIGH (ref 70–99)
POTASSIUM: 4.3 meq/L (ref 3.5–5.1)
Sodium: 136 mEq/L (ref 135–145)
TOTAL PROTEIN: 6.8 g/dL (ref 6.0–8.3)
Total Bilirubin: 0.7 mg/dL (ref 0.2–1.2)

## 2017-07-13 LAB — PSA: PSA: 3.28 ng/mL (ref 0.10–4.00)

## 2017-07-13 LAB — TSH: TSH: 2.04 u[IU]/mL (ref 0.35–4.50)

## 2017-07-13 LAB — HEMOGLOBIN A1C: Hgb A1c MFr Bld: 5.8 % (ref 4.6–6.5)

## 2017-07-13 NOTE — Assessment & Plan Note (Signed)
His  fasting  Glucose was elevated and  diagnostic of diabetes in Sept 2018 ,  But his A1c has been < 6.5 repeatedly . ;  He does  not need medications at this time,  But needs to adopt healthy  lifestyle changes including diet , weight loss  and regular  exercise .

## 2017-07-13 NOTE — Patient Instructions (Addendum)
Try taking the trazodone  at 7 pm  For 1 -2 weeks  .  If taking  the medication this early still does not help  Your insomnia,  We will try something else.   We discussed getting the Coronary CT calcium test to assess the amount of placque in your arteries.  $150 cost .  I recommend doing this in conjunction with a cardiology referral    Health Maintenance, Male A healthy lifestyle and preventive care is important for your health and wellness. Ask your health care provider about what schedule of regular examinations is right for you. What should I know about weight and diet? Eat a Healthy Diet  Eat plenty of vegetables, fruits, whole grains, low-fat dairy products, and lean protein.  Do not eat a lot of foods high in solid fats, added sugars, or salt.  Maintain a Healthy Weight Regular exercise can help you achieve or maintain a healthy weight. You should:  Do at least 150 minutes of exercise each week. The exercise should increase your heart rate and make you sweat (moderate-intensity exercise).  Do strength-training exercises at least twice a week.  Watch Your Levels of Cholesterol and Blood Lipids  Have your blood tested for lipids and cholesterol every 5 years starting at 55 years of age. If you are at high risk for heart disease, you should start having your blood tested when you are 55 years old. You may need to have your cholesterol levels checked more often if: ? Your lipid or cholesterol levels are high. ? You are older than 55 years of age. ? You are at high risk for heart disease.  What should I know about cancer screening? Many types of cancers can be detected early and may often be prevented. Lung Cancer  You should be screened every year for lung cancer if: ? You are a current smoker who has smoked for at least 30 years. ? You are a former smoker who has quit within the past 15 years.  Talk to your health care provider about your screening options, when you should  start screening, and how often you should be screened.  Colorectal Cancer  Routine colorectal cancer screening usually begins at 55 years of age and should be repeated every 5-10 years until you are 55 years old. You may need to be screened more often if early forms of precancerous polyps or small growths are found. Your health care provider may recommend screening at an earlier age if you have risk factors for colon cancer.  Your health care provider may recommend using home test kits to check for hidden blood in the stool.  A small camera at the end of a tube can be used to examine your colon (sigmoidoscopy or colonoscopy). This checks for the earliest forms of colorectal cancer.  Prostate and Testicular Cancer  Depending on your age and overall health, your health care provider may do certain tests to screen for prostate and testicular cancer.  Talk to your health care provider about any symptoms or concerns you have about testicular or prostate cancer.  Skin Cancer  Check your skin from head to toe regularly.  Tell your health care provider about any new moles or changes in moles, especially if: ? There is a change in a mole's size, shape, or color. ? You have a mole that is larger than a pencil eraser.  Always use sunscreen. Apply sunscreen liberally and repeat throughout the day.  Protect yourself by wearing long  sleeves, pants, a wide-brimmed hat, and sunglasses when outside.  What should I know about heart disease, diabetes, and high blood pressure?  If you are 28-71 years of age, have your blood pressure checked every 3-5 years. If you are 76 years of age or older, have your blood pressure checked every year. You should have your blood pressure measured twice-once when you are at a hospital or clinic, and once when you are not at a hospital or clinic. Record the average of the two measurements. To check your blood pressure when you are not at a hospital or clinic, you can  use: ? An automated blood pressure machine at a pharmacy. ? A home blood pressure monitor.  Talk to your health care provider about your target blood pressure.  If you are between 27-73 years old, ask your health care provider if you should take aspirin to prevent heart disease.  Have regular diabetes screenings by checking your fasting blood sugar level. ? If you are at a normal weight and have a low risk for diabetes, have this test once every three years after the age of 91. ? If you are overweight and have a high risk for diabetes, consider being tested at a younger age or more often.  A one-time screening for abdominal aortic aneurysm (AAA) by ultrasound is recommended for men aged 90-75 years who are current or former smokers. What should I know about preventing infection? Hepatitis B If you have a higher risk for hepatitis B, you should be screened for this virus. Talk with your health care provider to find out if you are at risk for hepatitis B infection. Hepatitis C Blood testing is recommended for:  Everyone born from [birth year] through 1963.  Anyone with known risk factors for hepatitis C.  Sexually Transmitted Diseases (STDs)  You should be screened each year for STDs including gonorrhea and chlamydia if: ? You are sexually active and are younger than 55 years of age. ? You are older than 55 years of age and your health care provider tells you that you are at risk for this type of infection. ? Your sexual activity has changed since you were last screened and you are at an increased risk for chlamydia or gonorrhea. Ask your health care provider if you are at risk.  Talk with your health care provider about whether you are at high risk of being infected with HIV. Your health care provider may recommend a prescription medicine to help prevent HIV infection.  What else can I do?  Schedule regular health, dental, and eye exams.  Stay current with your vaccines  (immunizations).  Do not use any tobacco products, such as cigarettes, chewing tobacco, and e-cigarettes. If you need help quitting, ask your health care provider.  Limit alcohol intake to no more than 2 drinks per day. One drink equals 12 ounces of beer, 5 ounces of wine, or 1 ounces of hard liquor.  Do not use street drugs.  Do not share needles.  Ask your health care provider for help if you need support or information about quitting drugs.  Tell your health care provider if you often feel depressed.  Tell your health care provider if you have ever been abused or do not feel safe at home. This information is not intended to replace advice given to you by your health care provider. Make sure you discuss any questions you have with your health care provider. Document Released: 08/05/2007 Document Revised: 10/06/2015 Document Reviewed: 11/10/2014  [employer]vier Interactive Patient Education  2018 [employer]  

## 2017-07-13 NOTE — Progress Notes (Signed)
Patient ID: Jeff [name], male    DOB: Nov 17, 1960  Age: 57 y.o. MRN: [id number]  [name] here for annual preventive examination and management of other chronic and acute problems.   The risk factors are reflected in the social history.  The roster of all physicians providing medical care to patient - is listed in the Snapshot section of the chart.  Activities of daily living:  [name] 100% independent in all ADLs: dressing, toileting, feeding as well as independent mobility  Home safety : The patient has smoke detectors in the home. They wear seatbelts.  There are no firearms at home. There is no violence in the home.   There is no risks for hepatitis, STDs or HIV. There is no   history of blood transfusion. They have no travel history to infectious disease endemic areas of the world.  The patient has seen their dentist in the last six month.  They admit to slight hearing difficulty with regard to whispered voices and some television programs.  They have deferred audiologic testing in the last year.  They do not  have excessive sun exposure. Discussed the need for sun protection: hats, long sleeves and use of sunscreen if there is significant sun exposure.   Diet: the importance of a healthy diet is discussed. They do have a healthy diet.  The benefits of regular aerobic exercise were discussed. He is exercising  3 times per week   Depression screen: there are no signs or vegative symptoms of depression- irritability, change in appetite, anhedonia, sadness/tearfullness..   Cognitive assessment: the patient manages all their financial and personal affairs and is actively engaged. They could relate day,date,year and events; recalled 2/3 objects at 3 minutes; performed clock-face test normally.  The following portions of the patient's history were reviewed and updated as appropriate: allergies, current medications, past family history, past medical history,  past surgical  history, past social history  and problem list.  Visual acuity was not assessed per patient preference since she has regular follow up with her ophthalmologist. Hearing and body mass index were assessed and reviewed.   During the course of the visit the patient was educated and counseled about appropriate screening and preventive services including : fall prevention , diabetes screening, nutrition counseling, colorectal cancer screening, and recommended immunizations.    CC: The primary encounter diagnosis was Prostate cancer screening. Diagnoses of Impaired fasting glucose, Fatigue, unspecified type, Encounter for preventive health examination, and Psychophysiological insomnia were also pertinent to this visit.  POAG right eye   Seeing Dr. Harlon Flor next week,  Right eye vision getting worse, may be aggravated by occupational exposure to particulate matter at [employer].  He is  Using eye drops to lubricate   Sees Urology for LUTS, BPH, Peyronie's. Had a bladder test a few months ago   Taking silodosin,  flomax did not help urinary stream . Several other Procedures have been offered to help  occasional SSCP lasting 1-2 minutes radiates to back.  Occurs at work   Relieved by burping .  Has frequent activities that include lifting, pulling and pushing  .  Worried that something was missed during piror cardiac workup.  Since brother died of massive MI in his 57's   chronic insomnia.    trazodone not working until late after midnight makes him groggy     History Jeff [name] has a past medical history of Arthritis, Cataract, Claustrophobia, GERD (gastroesophageal reflux disease), Glaucoma (increased eye pressure), Headache(784.0), Hepatic steatosis,  Hyperglycemia, Hyperlipidemia, Hypertension, and Sleep apnea.   He has a past surgical history that includes Mini shunt insertion (03/09/2011); Finger surgery (Right); Eye surgery; Cataract extraction w/PHACO (Left, 07/16/2013); and Cataract extraction w/PHACO (Right,  10/29/2013).   His family history includes Aneurysm in his brother and father; Asthma in his father, mother, and paternal aunt; Early death in his brother and sister; Heart disease in his father, mother, and sister; Hyperlipidemia in his father and mother; Hypertension in his father and mother; Liver cancer in his mother; Stroke in his father; Thyroid disease in his mother.He reports that he has never smoked. He has never used smokeless tobacco. He reports that he drinks about 3.6 - 4.8 oz of alcohol per week. He reports that he does not use drugs.  Outpatient Medications Prior to Visit  Medication Sig Dispense Refill  . allopurinol (ZYLOPRIM) 300 MG tablet TAKE 1/2 TABLETS (150 MG TOTAL) BY MOUTH DAILY. 90 tablet 0  . buPROPion (WELLBUTRIN SR) 150 MG 12 hr tablet Take 1 tablet (150 mg total) by mouth 2 (two) times daily. 180 tablet 0  . colchicine 0.6 MG tablet TAKE HOURLY UNTIL GOUT IMPROVED/DIARRHEA, ON FIRST DAY OF TREATMENT.TAKE DAILY THEREAFTER AS NEEDED 30 tablet 3  . dorzolamide-timolol (COSOPT) 22.3-6.8 MG/ML ophthalmic solution PLACE 1 DROP INTO THE RIGHT EYE 2 (TWO) TIMES DAILY.  11  . losartan-hydrochlorothiazide (HYZAAR) 100-25 MG tablet TAKE 1 TABLET BY MOUTH EVERY DAY 90 tablet 1  . metoprolol succinate (TOPROL-XL) 25 MG 24 hr tablet TAKE 1 TABLET (25 MG TOTAL) BY MOUTH DAILY. 90 tablet 1  . pantoprazole (PROTONIX) 40 MG tablet Take 1 tablet (40 mg total) by mouth 2 (two) times daily. 180 tablet 0  . rOPINIRole (REQUIP) 0.5 MG tablet Take 1 tablet (0.5 mg total) by mouth at bedtime. 90 tablet 1  . sildenafil (REVATIO) 20 MG tablet Take 1 tablet (20 mg total) by mouth 3 (three) times daily. 30 tablet 0  . silodosin (RAPAFLO) 8 MG CAPS capsule TAKE 1 CAPSULE BY MOUTH EVERYDAY AT BEDTIME  11  . simvastatin (ZOCOR) 20 MG tablet TAKE 1 TABLET BY MOUTH EVERYDAY AT BEDTIME 90 tablet 1  . tamsulosin (FLOMAX) 0.4 MG CAPS capsule Take 1 capsule (0.4 mg total) by mouth daily. 90 capsule 3  .  tiZANidine (ZANAFLEX) 4 MG tablet TAKE 1 TABLET (4 MG TOTAL) BY MOUTH AT BEDTIME. 90 tablet 1  . traMADol (ULTRAM) 50 MG tablet TAKE 1 TABLET BY MOUTH EVERY 6 HOURS AS NEEDED 120 tablet 0  . traZODone (DESYREL) 50 MG tablet Take 1 tablet (50 mg total) by mouth daily as needed for sleep. 90 tablet 1  . clonazePAM (KLONOPIN) 0.5 MG tablet Take 1 tablet (0.5 mg total) by mouth at bedtime. As needed for insomnia (Patient not taking: Reported on 07/13/2017) 30 tablet 5  . cyclobenzaprine (FLEXERIL) 10 MG tablet Take 1 tablet (10 mg total) by mouth 2 (two) times daily as needed for muscle spasms. (Patient not taking: Reported on 07/13/2017) 20 tablet 0  . levofloxacin (LEVAQUIN) 500 MG tablet Take 1 tablet (500 mg total) by mouth daily. (Patient not taking: Reported on 07/13/2017) 10 tablet 0  . PRED FORTE 1 % ophthalmic suspension   6  . predniSONE (DELTASONE) 10 MG tablet 6 tablets on Day 1 , then reduce by 1 tablet daily until gone (Patient not taking: Reported on 07/13/2017) 21 tablet 0   Facility-Administered Medications Prior to Visit  Medication Dose Route Frequency Provider Last Rate Last Dose  .  lidocaine (XYLOCAINE) 1 % (with pres) injection 4 mL  4 mL Other Once Sherlene Shams, MD      . triamcinolone acetonide (KENALOG-40) injection 40 mg  40 mg Intra-articular Once Sherlene Shams, MD        Review of Systems   Patient denies headache, fevers, malaise, unintentional weight loss, skin rash, eye pain, sinus congestion and sinus pain, sore throat, dysphagia,  hemoptysis , cough, dyspnea, wheezing,  palpitations, orthopnea, edema, abdominal pain, nausea, melena, diarrhea, constipation, flank pain, dysuria, hematuria, urinary  Frequency, nocturia, numbness, tingling, seizures,  Focal weakness, Loss of consciousness,  Tremor,  depression, anxiety, and suicidal ideation.      Objective:  BP 108/78 (BP Location: Left Arm, Patient Position: Sitting, Cuff Size: Normal)   Pulse 70   Temp (!) 97.5 F  (36.4 C) (Oral)   Resp 15   Ht  (1.803 m)   Wt 194 lb 6.4 oz (88.2 kg)   SpO2 96%   BMI 27.11 kg/m   Physical Exam   General appearance: alert, cooperative and appears stated age Ears: normal TM's and external ear canals both ears Throat: lips, mucosa, and tongue normal; teeth and gums normal Neck: no adenopathy, no carotid bruit, supple, symmetrical, trachea midline and thyroid not enlarged, symmetric, no tenderness/mass/nodules Back: symmetric, no curvature. ROM normal. No CVA tenderness. Lungs: clear to auscultation bilaterally Heart: regular rate and rhythm, S1, S2 normal, no murmur, click, rub or gallop Abdomen: soft, non-tender; bowel sounds normal; no masses,  no organomegaly Pulses: 2+ and symmetric Skin: Skin color, texture, turgor normal. No rashes or lesions Lymph nodes: Cervical, supraclavicular, and axillary nodes normal.    Assessment & Plan:   Problem List Items Addressed This Visit    Impaired fasting glucose    His  fasting  Glucose was elevated and  diagnostic of diabetes in Sept 2018 ,  But his A1c has been < 6.5 repeatedly . ;  He does  not need medications at this time,  But needs to adopt healthy  lifestyle changes including diet , weight loss  and regular  exercise .        Relevant Orders   Hemoglobin A1c (Completed)   Insomnia    He has a history of alcohol abuse.  Cautioned to avoid use of benzo's.  Advised to continue trazodone with earlier administration at 7:30 PM      Encounter for preventive health examination    Annual comprehensive preventive exam was done as well as an evaluation and management of acute and chronic conditions .  During the course of the visit the patient was educated and counseled about appropriate screening and preventive services including :  diabetes screening, lipid analysis with projected  10 year  risk for CAD , nutrition counseling, prostate and colorectal cancer screening, and recommended immunizations.  Printed  recommendations for health maintenance screenings was given.   Lab Results  Component Value Date   PSA 3.28 07/13/2017   PSA 2.55 07/13/2016   PSA 2.38 07/09/2015     .       Other Visit Diagnoses    Prostate cancer screening    -  Primary   Relevant Orders   PSA (Completed)   Fatigue, unspecified type       Relevant Orders   Comprehensive metabolic panel (Completed)   TSH (Completed)   CBC with Differential/Platelet (Completed)      I have discontinued Lum Keas. [place]'s PRED FORTE, cyclobenzaprine, clonazePAM, levofloxacin,  and predniSONE. I am also having him maintain his tiZANidine, allopurinol, traZODone, tamsulosin, dorzolamide-timolol, colchicine, sildenafil, buPROPion, metoprolol succinate, rOPINIRole, simvastatin, losartan-hydrochlorothiazide, pantoprazole, traMADol, and silodosin. We will continue to administer triamcinolone acetonide and lidocaine.  No orders of the defined types were placed in this encounter.   Medications Discontinued During This Encounter  Medication Reason  . clonazePAM (KLONOPIN) 0.5 MG tablet Patient has not taken in last 30 days  . cyclobenzaprine (FLEXERIL) 10 MG tablet Patient has not taken in last 30 days  . levofloxacin (LEVAQUIN) 500 MG tablet Completed Course  . PRED FORTE 1 % ophthalmic suspension Patient has not taken in last 30 days  . predniSONE (DELTASONE) 10 MG tablet Patient has not taken in last 30 days    Follow-up: Return in about 6 months (around 01/13/2018) for hypertension,  prediabetes.   Sherlene Shams, MD

## 2017-07-15 NOTE — Assessment & Plan Note (Signed)
He has a history of alcohol abuse.  Cautioned to avoid use of benzo's.  Advised to continue trazodone with earlier administration at 7:30 PM

## 2017-07-15 NOTE — Assessment & Plan Note (Signed)
Annual comprehensive preventive exam was done as well as an evaluation and management of acute and chronic conditions .  During the course of the visit the patient was educated and counseled about appropriate screening and preventive services including :  diabetes screening, lipid analysis with projected  10 year  risk for CAD , nutrition counseling, prostate and colorectal cancer screening, and recommended immunizations.  Printed recommendations for health maintenance screenings was given.   Lab Results  Component Value Date   PSA 3.28 07/13/2017   PSA 2.55 07/13/2016   PSA 2.38 07/09/2015     .

## 2017-08-03 ENCOUNTER — Ambulatory Visit: Payer: Self-pay | Admitting: Endocrinology

## 2017-08-29 ENCOUNTER — Other Ambulatory Visit: Payer: Self-pay | Admitting: Internal Medicine

## 2017-09-13 ENCOUNTER — Other Ambulatory Visit: Payer: Self-pay | Admitting: Internal Medicine

## 2017-10-01 ENCOUNTER — Other Ambulatory Visit: Payer: Self-pay | Admitting: Internal Medicine

## 2017-10-02 MED ORDER — TRAMADOL HCL 50 MG PO TABS: 50.0000 mg | ORAL_TABLET | Freq: Four times a day (QID) | ORAL | 3 refills | Status: DC | PRN

## 2017-10-02 NOTE — Telephone Encounter (Signed)
Refilled: 06/22/2017 Last OV: 07/13/2017 Next OV: 01/14/2018

## 2017-10-05 ENCOUNTER — Telehealth: Payer: Self-pay | Admitting: Internal Medicine

## 2017-10-05 DIAGNOSIS — I10 Essential (primary) hypertension: Secondary | ICD-10-CM

## 2017-10-05 NOTE — Telephone Encounter (Signed)
PT dropped off lab orders from Dr. Chalmers Guestoy Whitaker, MD. Pt would like to have labs done here. Lab orders are up front in Dr. Melina Schoolsullo's color folder.  Also, Pt would like to have the heart test that he and Dr. Darrick Huntsmanullo talked about at last visit. Pt understands that his insurance will not pay the $150 cost, but wants to have it.  Pt knows that Dr. Darrick Huntsmanullo will not be back into office until Monday.

## 2017-10-08 ENCOUNTER — Other Ambulatory Visit: Payer: Self-pay | Admitting: Internal Medicine

## 2017-10-08 DIAGNOSIS — R519 Headache, unspecified: Secondary | ICD-10-CM

## 2017-10-08 DIAGNOSIS — R51 Headache: Principal | ICD-10-CM

## 2017-10-08 NOTE — Progress Notes (Unsigned)
esr

## 2017-10-08 NOTE — Telephone Encounter (Signed)
Mychart message has been sent to inform patient. 

## 2017-10-08 NOTE — Telephone Encounter (Signed)
FYI

## 2017-10-08 NOTE — Telephone Encounter (Signed)
Labs and cardiac ct ordered

## 2017-10-25 NOTE — Telephone Encounter (Signed)
Pt called in to follow up on request. Pt says that he is unable to read his message in my-chart. I did relay the information and also provided the phone # for CT location.   Pt is requesting a call back to discuss other testing for arthritis  CB: [phone]- Okay to lvm

## 2017-10-25 NOTE — Telephone Encounter (Signed)
Patient requesting call about results

## 2017-10-26 ENCOUNTER — Other Ambulatory Visit: Payer: Self-pay

## 2017-10-30 NOTE — Telephone Encounter (Signed)
LMTCB. Please transfer pt to our office.  

## 2017-11-01 NOTE — Telephone Encounter (Signed)
LMTCB. Please transfer pt to our office.  

## 2017-11-09 ENCOUNTER — Inpatient Hospital Stay: Admission: RE | Admit: 2017-11-09 | Payer: Self-pay | Source: Ambulatory Visit

## 2017-11-13 NOTE — Telephone Encounter (Signed)
LMTCB. Have attempted to call several times. Mailbox is full.

## 2017-11-30 ENCOUNTER — Ambulatory Visit (INDEPENDENT_AMBULATORY_CARE_PROVIDER_SITE_OTHER)
Admission: RE | Admit: 2017-11-30 | Discharge: 2017-11-30 | Disposition: A | Discharge status: Home / Self Care | Payer: Self-pay | Source: Ambulatory Visit | Attending: Internal Medicine | Admitting: Internal Medicine

## 2017-11-30 DIAGNOSIS — I10 Essential (primary) hypertension: Secondary | ICD-10-CM

## 2017-12-03 ENCOUNTER — Encounter: Payer: Self-pay | Admitting: Internal Medicine

## 2017-12-03 DIAGNOSIS — I2584 Coronary atherosclerosis due to calcified coronary lesion: Secondary | ICD-10-CM

## 2017-12-03 DIAGNOSIS — R931 Abnormal findings on diagnostic imaging of heart and coronary circulation: Secondary | ICD-10-CM

## 2017-12-03 DIAGNOSIS — I2510 Atherosclerotic heart disease of native coronary artery without angina pectoris: Secondary | ICD-10-CM

## 2017-12-06 ENCOUNTER — Other Ambulatory Visit: Payer: Self-pay | Admitting: Internal Medicine

## 2017-12-07 ENCOUNTER — Other Ambulatory Visit: Payer: Self-pay | Admitting: Internal Medicine

## 2017-12-14 NOTE — Telephone Encounter (Signed)
Copied from CRM [id number]. Topic: Quick Communication - Other Results (Clinic Use ONLY) >> Dec 14, 2017  1:09 PM Leafy Ro wrote: Pt is unable to get into his mychart and would like cardiac ct scan results.

## 2017-12-20 NOTE — Telephone Encounter (Signed)
LMTCB. PEC may speak with pt.  

## 2017-12-20 NOTE — Telephone Encounter (Signed)
Pt returning call and pt given test results per notes of Dr.Tullo via MyChart message on  12/03/17. Pt verbalized understanding. Pt states he would like to be seen locally rather than going to [place] or [facility]. Pt states he was seen by Dr. Charlyne Quale about 3 years ago at the [facility] location for a stress test.

## 2017-12-24 NOTE — Addendum Note (Signed)
Addended by: Sherlene Shams on: 12/24/2017 11:32 PM   Modules accepted: Orders

## 2017-12-24 NOTE — Telephone Encounter (Signed)
Pt would like to be seen locally.

## 2017-12-24 NOTE — Telephone Encounter (Signed)
  Your referral is in process as discussed.

## 2017-12-28 ENCOUNTER — Ambulatory Visit: Payer: BLUE CROSS/BLUE SHIELD | Admitting: Internal Medicine

## 2017-12-28 ENCOUNTER — Encounter: Payer: Self-pay | Admitting: Internal Medicine

## 2017-12-28 ENCOUNTER — Ambulatory Visit (INDEPENDENT_AMBULATORY_CARE_PROVIDER_SITE_OTHER): Payer: BLUE CROSS/BLUE SHIELD

## 2017-12-28 VITALS — BP 144/88 | HR 71 | Temp 98.2°F | Resp 15 | Ht 71.0 in | Wt 195.8 lb

## 2017-12-28 DIAGNOSIS — R51 Headache: Secondary | ICD-10-CM | POA: Diagnosis not present

## 2017-12-28 DIAGNOSIS — E042 Nontoxic multinodular goiter: Secondary | ICD-10-CM | POA: Diagnosis not present

## 2017-12-28 DIAGNOSIS — K760 Fatty (change of) liver, not elsewhere classified: Secondary | ICD-10-CM | POA: Diagnosis not present

## 2017-12-28 DIAGNOSIS — I2510 Atherosclerotic heart disease of native coronary artery without angina pectoris: Secondary | ICD-10-CM

## 2017-12-28 DIAGNOSIS — R7301 Impaired fasting glucose: Secondary | ICD-10-CM

## 2017-12-28 DIAGNOSIS — R519 Headache, unspecified: Secondary | ICD-10-CM

## 2017-12-28 DIAGNOSIS — I2584 Coronary atherosclerosis due to calcified coronary lesion: Secondary | ICD-10-CM

## 2017-12-28 DIAGNOSIS — M79641 Pain in right hand: Secondary | ICD-10-CM

## 2017-12-28 LAB — COMPREHENSIVE METABOLIC PANEL
ALBUMIN: 4 g/dL (ref 3.5–5.2)
ALT: 45 U/L (ref 0–53)
AST: 60 U/L — AB (ref 0–37)
Alkaline Phosphatase: 51 U/L (ref 39–117)
BUN: 14 mg/dL (ref 6–23)
CO2: 29 meq/L (ref 19–32)
CREATININE: 1.01 mg/dL (ref 0.40–1.50)
Calcium: 9 mg/dL (ref 8.4–10.5)
Chloride: 104 mEq/L (ref 96–112)
GFR: 81.29 mL/min (ref >60.00)
GLUCOSE: 116 mg/dL — AB (ref 70–99)
Potassium: 4.4 mEq/L (ref 3.5–5.1)
SODIUM: 138 meq/L (ref 135–145)
Total Bilirubin: 0.6 mg/dL (ref 0.2–1.2)
Total Protein: 7 g/dL (ref 6.0–8.3)

## 2017-12-28 LAB — HEMOGLOBIN A1C: Hgb A1c MFr Bld: 5.6 % (ref 4.6–6.5)

## 2017-12-28 LAB — SEDIMENTATION RATE: SED RATE: 10 mm/h (ref 0–20)

## 2017-12-28 MED ORDER — TRAMADOL HCL 50 MG PO TABS: 50.0000 mg | ORAL_TABLET | Freq: Four times a day (QID) | ORAL | 3 refills | Status: DC | PRN

## 2017-12-28 MED ORDER — HYDROCHLOROTHIAZIDE 25 MG PO TABS: 25.0000 mg | ORAL_TABLET | Freq: Every day | ORAL | 3 refills | Status: DC

## 2017-12-28 MED ORDER — CELECOXIB 200 MG PO CAPS: 200.0000 mg | ORAL_CAPSULE | Freq: Every day | ORAL | 0 refills | Status: DC

## 2017-12-28 MED ORDER — LOSARTAN POTASSIUM 100 MG PO TABS: 100.0000 mg | ORAL_TABLET | Freq: Every day | ORAL | 3 refills | Status: DC

## 2017-12-28 NOTE — Progress Notes (Signed)
Subjective:  Patient ID: Jeff [name], male    DOB: Apr 23, 1960  Age: 57 y.o. MRN: [id number]  CC: The primary encounter diagnosis was Chronic intractable headache, unspecified headache type. Diagnoses of Pain of right hand, Hepatic steatosis, Pain in right hand, Multiple thyroid nodules, Impaired fasting glucose, New onset of headaches after age 24, and Coronary artery disease due to calcified coronary lesion were also pertinent to this visit.  HPI Jeff [name] presents for follow up on multiple issues,  Last seen in May.    1) hypertension.  Patient is taking his medications as prescribed and notes no adverse effects.  Home BP readings have been done about once per week and are  generally < 130/80 .  he is avoiding added salt in her diet and walking regularly about 3 times per week for exercise  . Has been out of losartan /hct for several days due to pharmacy having a shortage of the medication (told "it was on  back order ")  2)  Swollen right hand :    Right hand crush injury that occurred at work, lateral half of hand is  swollen from 3rd finger to 5th,  including wrist .  Has been icing it and using Advil.  able to use keyboard.  History of prior fracture in 2018 (Boxer's fracture) with plate from prior surgery.  Planning on getting it x rayed on Monday through work .    Has been taking increased amoutns of advil 800 mg  Daily  ]along with  tramadol   3) headaches have become more frequent ,bilateral temporal in location and at base of skull  Saw ophthalmology  in May ,  MD wanted him to get ESR to rule out arteritis ,  Did not come in until now .    4) CAD:  2 vessel disease noted on Cardiac CT done 2 weeks ago .  Has not had cardiology follow up in over 3 years. Denies chest pain   Outpatient Medications Prior to Visit  Medication Sig Dispense Refill  . allopurinol (ZYLOPRIM) 300 MG tablet TAKE 1/2 TABLETS (150 MG TOTAL) BY MOUTH DAILY. 90 tablet 0  . colchicine 0.6 MG tablet  TAKE HOURLY UNTIL GOUT IMPROVED/DIARRHEA, ON FIRST DAY OF TREATMENT.TAKE DAILY THEREAFTER AS NEEDED 30 tablet 3  . dorzolamide-timolol (COSOPT) 22.3-6.8 MG/ML ophthalmic solution PLACE 1 DROP INTO THE RIGHT EYE 2 (TWO) TIMES DAILY.  11  . metoprolol succinate (TOPROL-XL) 25 MG 24 hr tablet TAKE 1 TABLET (25 MG TOTAL) BY MOUTH DAILY. 90 tablet 1  . pantoprazole (PROTONIX) 40 MG tablet TAKE 1 TABLET BY MOUTH TWICE A DAY 180 tablet 1  . rOPINIRole (REQUIP) 0.5 MG tablet Take 1 tablet (0.5 mg total) by mouth at bedtime. 90 tablet 1  . sildenafil (REVATIO) 20 MG tablet Take 1 tablet (20 mg total) by mouth 3 (three) times daily. 30 tablet 0  . silodosin (RAPAFLO) 8 MG CAPS capsule TAKE 1 CAPSULE BY MOUTH EVERYDAY AT BEDTIME  11  . simvastatin (ZOCOR) 20 MG tablet TAKE 1 TABLET BY MOUTH EVERYDAY AT BEDTIME 90 tablet 0  . tiZANidine (ZANAFLEX) 4 MG tablet TAKE 1 TABLET (4 MG TOTAL) BY MOUTH AT BEDTIME. 90 tablet 1  . traZODone (DESYREL) 50 MG tablet Take 1 tablet (50 mg total) by mouth daily as needed for sleep. 90 tablet 1  . losartan-hydrochlorothiazide (HYZAAR) 100-25 MG tablet TAKE 1 TABLET BY MOUTH EVERY DAY 90 tablet 1  . traMADol (ULTRAM) 50  MG tablet Take 1 tablet (50 mg total) by mouth every 6 (six) hours as needed for moderate pain. 120 tablet 3  . buPROPion (WELLBUTRIN SR) 150 MG 12 hr tablet Take 1 tablet (150 mg total) by mouth 2 (two) times daily. (Patient not taking: Reported on 12/28/2017) 180 tablet 0  . tamsulosin (FLOMAX) 0.4 MG CAPS capsule Take 1 capsule (0.4 mg total) by mouth daily. (Patient not taking: Reported on 12/28/2017) 90 capsule 3   Facility-Administered Medications Prior to Visit  Medication Dose Route Frequency Provider Last Rate Last Dose  . lidocaine (XYLOCAINE) 1 % (with pres) injection 4 mL  4 mL Other Once Crecencio Mc, MD      . triamcinolone acetonide (KENALOG-40) injection 40 mg  40 mg Intra-articular Once Crecencio Mc, MD        Review of  Systems;  Patient denies headache, fevers, malaise, unintentional weight loss, skin rash, eye pain, sinus congestion and sinus pain, sore throat, dysphagia,  hemoptysis , cough, dyspnea, wheezing, chest pain, palpitations, orthopnea, edema, abdominal pain, nausea, melena, diarrhea, constipation, flank pain, dysuria, hematuria, urinary  Frequency, nocturia, numbness, tingling, seizures,  Focal weakness, Loss of consciousness,  Tremor, insomnia, depression, anxiety, and suicidal ideation.      Objective:  BP (!) 144/88 (BP Location: Left Arm, Patient Position: Sitting, Cuff Size: Normal)   Pulse 71   Temp 98.2 F (36.8 C) (Oral)   Resp 15   Ht '5\' 11"'$  (1.803 m)   Wt 195 lb 12.8 oz (88.8 kg)   SpO2 99%   BMI 27.31 kg/m   BP Readings from Last 3 Encounters:  12/28/17 (!) 144/88  07/13/17 108/78  04/13/17 138/84    Wt Readings from Last 3 Encounters:  12/28/17 195 lb 12.8 oz (88.8 kg)  07/13/17 194 lb 6.4 oz (88.2 kg)  04/13/17 198 lb 9.6 oz (90.1 kg)    General appearance: alert, cooperative and appears stated age Ears: normal TM's and external ear canals both ears Throat: lips, mucosa, and tongue normal; teeth and gums normal Neck: no adenopathy, no carotid bruit, supple, symmetrical, trachea midline and thyroid not enlarged, symmetric, no tenderness/mass/nodules Back: symmetric, no curvature. ROM normal. No CVA tenderness. Lungs: clear to auscultation bilaterally Heart: regular rate and rhythm, S1, S2 normal, no murmur, click, rub or gallop Abdomen: soft, non-tender; bowel sounds normal; no masses,  no organomegaly Pulses: 2+ and symmetric Skin: Skin color, texture, turgor normal. No rashes or lesions Lymph nodes: Cervical, supraclavicular, and axillary nodes normal.  Lab Results  Component Value Date   HGBA1C 5.6 12/28/2017   HGBA1C 5.8 07/13/2017   HGBA1C 5.8 04/13/2017    Lab Results  Component Value Date   CREATININE 1.01 12/28/2017   CREATININE 0.93 07/13/2017    CREATININE 0.92 04/13/2017    Lab Results  Component Value Date   WBC 4.7 07/13/2017   HGB 15.1 07/13/2017   HCT 43.8 07/13/2017   PLT 272.0 07/13/2017   GLUCOSE 116 (H) 12/28/2017   CHOL 206 (H) 10/31/2016   TRIG 184.0 (H) 10/31/2016   HDL 67.50 10/31/2016   LDLDIRECT 144.0 08/06/2012   LDLCALC 102 (H) 10/31/2016   ALT 45 12/28/2017   AST 60 (H) 12/28/2017   NA 138 12/28/2017   K 4.4 12/28/2017   CL 104 12/28/2017   CREATININE 1.01 12/28/2017   BUN 14 12/28/2017   CO2 29 12/28/2017   TSH 2.04 07/13/2017   PSA 3.28 07/13/2017   HGBA1C 5.6 12/28/2017   MICROALBUR  1.3 05/14/2014    Ct Cardiac Scoring  Addendum Date: 12/03/2017   ADDENDUM REPORT: 12/03/2017 08:25 CLINICAL DATA:  Risk stratification EXAM: Coronary Calcium Score TECHNIQUE: The patient was scanned on a Siemens Somatom 64 slice scanner. Axial non-contrast 3 mm slices were carried out through the heart. The data set was analyzed on a dedicated work station and scored using the [place]. FINDINGS: Non-cardiac: See separate report from [facility]. Ascending Aorta: Normal diameter 3.0 cm Pericardium: Normal Coronary arteries: Dense calcification of the proximal and mid LAD Mild calcification of the proximal RCA IMPRESSION: Coronary calcium score of 451 . This was 95 th percentile for age and sex matched control. Consider f/u stress testing / perfusion study given how high calcium score is to r/o anterior wall ischemia Jenkins Rouge Electronically Signed   By: Jenkins Rouge M.D.   On: 12/03/2017 08:25   Result Date: 12/03/2017 EXAM: OVER-READ INTERPRETATION  CT CHEST The following report is an over-read performed by radiologist Dr. Rolm Baptise of [facility] on 11/30/2017. This over-read does not include interpretation of cardiac or coronary anatomy or pathology. The coronary calcium score interpretation by the cardiologist is attached. COMPARISON:  None. FINDINGS: Vascular: Heart is normal size.   Visualized aorta is normal caliber. Mediastinum/Nodes: No adenopathy in the lower mediastinum or hila. Lungs/Pleura: Visualized lungs clear.  No effusions. Upper Abdomen: Imaging into the upper abdomen shows no acute findings. Musculoskeletal: Chest wall soft tissues are unremarkable. No acute bony abnormality. IMPRESSION: No acute or significant extracardiac abnormality. Electronically Signed: By: Rolm Baptise M.D. On: 11/30/2017 13:37    Assessment & Plan:   Problem List Items Addressed This Visit    Coronary artery disease due to calcified coronary lesion    2 vessel disease,  Calcium score of 451 (95th percentile) with dense calcification of the proximal and mid LAD noted ,  Cardiology referral in process.  Continue simvastatin, losartan and metoprolol .  Adding asa      Relevant Medications   losartan (COZAAR) 100 MG tablet   hydrochlorothiazide (HYDRODIURIL) 25 MG tablet   Hepatic steatosis   Relevant Orders   Comprehensive metabolic panel (Completed)   Hemoglobin A1c (Completed)   Impaired fasting glucose    His  fasting  Glucose was elevated and  diagnostic of diabetes in Sept 2018 ,  But he has lowered his A1 to 5.6 with  healthy  lifestyle changes including Mediterranean diet , weight loss  and regular  exercise . Lab Results  Component Value Date   HGBA1C 5.6 12/28/2017          Multiple thyroid nodules    Thyroid biopsy in Sept was benign      New onset of headaches after age 45 (Chronic)    Temporal arteritis ruled out with a normal ESR .  Headaches are likely due to cervical disk disease, eye strain .  Will consider sleep study to rule out OSA and  repeat MRI of brain to rule out mass if persistent   Lab Results  Component Value Date   ESRSEDRATE 10 12/28/2017         Relevant Medications   celecoxib (CELEBREX) 200 MG capsule   traMADol (ULTRAM) 50 MG tablet   Pain in right hand    Plain films were done to rule out fractures given the degree of swelling  noted.  No new fractures were noted.        Other Visit Diagnoses    Chronic intractable  headache, unspecified headache type    -  Primary   Relevant Medications   celecoxib (CELEBREX) 200 MG capsule   traMADol (ULTRAM) 50 MG tablet   Other Relevant Orders   Sedimentation rate (Completed)   Pain of right hand       Relevant Orders   DG Hand Complete Right (Completed)      I have discontinued Jeff [name]'s losartan-hydrochlorothiazide. I am also having him start on celecoxib, losartan, and hydrochlorothiazide. Additionally, I am having him maintain his tiZANidine, allopurinol, traZODone, tamsulosin, dorzolamide-timolol, colchicine, sildenafil, buPROPion, rOPINIRole, silodosin, metoprolol succinate, pantoprazole, simvastatin, and traMADol. We will continue to administer triamcinolone acetonide and lidocaine.  Meds ordered this encounter  Medications  . celecoxib (CELEBREX) 200 MG capsule    Sig: Take 1 capsule (200 mg total) by mouth daily.    Dispense:  30 capsule    Refill:  0  . traMADol (ULTRAM) 50 MG tablet    Sig: Take 1 tablet (50 mg total) by mouth every 6 (six) hours as needed for moderate pain.    Dispense:  120 tablet    Refill:  3    Not to exceed 5 additional fills before 12/19/2017.  Marland Kitchen losartan (COZAAR) 100 MG tablet    Sig: Take 1 tablet (100 mg total) by mouth daily.    Dispense:  90 tablet    Refill:  3  . hydrochlorothiazide (HYDRODIURIL) 25 MG tablet    Sig: Take 1 tablet (25 mg total) by mouth daily.    Dispense:  90 tablet    Refill:  3   A total of 40 minutes was spent with patient more than half of which was spent in counseling patient on the above mentioned issues , reviewing and explaining recent labs and imaging studies done, and coordination of care.  Medications Discontinued During This Encounter  Medication Reason  . traMADol (ULTRAM) 50 MG tablet Reorder  . losartan-hydrochlorothiazide (HYZAAR) 100-25 MG tablet     Follow-up: No  follow-ups on file.   Crecencio Mc, MD

## 2017-12-28 NOTE — Patient Instructions (Addendum)
YOU CAN USE ADVIL OR CELEBREX ( CELEXICOB) DAILY FOR YOUR INFLAM MATION    I HAVE REFILLED THE LOSARTAN AND HCT SEPARATELY

## 2017-12-30 ENCOUNTER — Telehealth: Payer: Self-pay | Admitting: Internal Medicine

## 2017-12-30 DIAGNOSIS — I2584 Coronary atherosclerosis due to calcified coronary lesion: Secondary | ICD-10-CM

## 2017-12-30 DIAGNOSIS — M79641 Pain in right hand: Secondary | ICD-10-CM | POA: Insufficient documentation

## 2017-12-30 DIAGNOSIS — I2510 Atherosclerotic heart disease of native coronary artery without angina pectoris: Secondary | ICD-10-CM | POA: Insufficient documentation

## 2017-12-30 NOTE — Assessment & Plan Note (Signed)
Plain films were done to rule out fractures given the degree of swelling noted.  No new fractures were noted.

## 2017-12-30 NOTE — Assessment & Plan Note (Signed)
His  fasting  Glucose was elevated and  diagnostic of diabetes in Sept 2018 ,  But he has lowered his A1 to 5.6 with  healthy  lifestyle changes including Mediterranean diet , weight loss  and regular  exercise . Lab Results  Component Value Date   HGBA1C 5.6 12/28/2017

## 2017-12-30 NOTE — Assessment & Plan Note (Signed)
2 vessel disease,  Calcium score of 451 (95th percentile) with dense calcification of the proximal and mid LAD noted ,  Cardiology referral in process.  Continue simvastatin, losartan and metoprolol .  Adding asa

## 2017-12-30 NOTE — Assessment & Plan Note (Addendum)
Temporal arteritis ruled out with a normal ESR .  Headaches are likely due to cervical disk disease, eye strain .  Will consider sleep study to rule out OSA and  repeat MRI of brain to rule out mass if persistent   Lab Results  Component Value Date   ESRSEDRATE 10 12/28/2017

## 2017-12-30 NOTE — Telephone Encounter (Signed)
We did not discuss his need to start taking aspirin daily at his viist,  But he needs to start taking 81  Mg of aspirin (not more)

## 2017-12-30 NOTE — Assessment & Plan Note (Signed)
Thyroid biopsy in Sept was benign

## 2017-12-31 NOTE — Telephone Encounter (Signed)
LMTCB. PEC may speak with pt.  

## 2018-01-01 NOTE — Telephone Encounter (Signed)
LMTCB. PEC may speak with pt.  

## 2018-01-02 NOTE — Telephone Encounter (Signed)
I left detailed VM & stated that patient could call back if he had any further questions regarding results.

## 2018-01-02 NOTE — Telephone Encounter (Signed)
Pt aware of message below. Pt would like a detailed message left on his phone with his x-ray and lab results. He is unable to have his phone at work.

## 2018-01-09 ENCOUNTER — Telehealth: Payer: Self-pay | Admitting: *Deleted

## 2018-01-09 NOTE — Telephone Encounter (Signed)
Attempted to call the office for a fax number and was unsuccessful. LMTCB with pt to see if he has [facility]. PEC may speak with pt.

## 2018-01-09 NOTE — Telephone Encounter (Signed)
Copied from CRM [phone]. Topic: General - Other >> Jan 09, 2018 12:59 PM Crist InfanteHarrald, Kaydra Borgen J wrote: Reason for CRM: pt states he was to call back with his glaucoma specialist Dr Chalmers Guestoy Whitaker  [facility] [phone] Pt would like you to fax his labs and CT scan results to this doctor.

## 2018-01-14 ENCOUNTER — Ambulatory Visit: Payer: Self-pay | Admitting: Internal Medicine

## 2018-01-14 ENCOUNTER — Encounter

## 2018-01-19 ENCOUNTER — Other Ambulatory Visit: Payer: Self-pay | Admitting: Internal Medicine

## 2018-01-30 ENCOUNTER — Telehealth: Payer: Self-pay | Admitting: *Deleted

## 2018-01-30 NOTE — Telephone Encounter (Signed)
Copied from CRM [id number]. Topic: General - Other >> Jan 30, 2018  1:46 PM Wyonia HoughJohnson, Chaz E wrote: Reason for CRM: Evalina FieldBailey Sigmon from [facility] called to see if she can get the notes in reference to the Pts right hand injury at work./ please advise  Workers comp - Nov 6th   Cb# [phone]

## 2018-01-31 NOTE — Telephone Encounter (Signed)
Attempted to call pt to let him know that he will need to come by the office to sign a medical record release form so that we can fax over the records to [facility]. No answer, mailbox is full. PEC may speak with pt.

## 2018-02-04 ENCOUNTER — Other Ambulatory Visit: Payer: Self-pay | Admitting: Internal Medicine

## 2018-02-04 ENCOUNTER — Other Ambulatory Visit: Payer: Self-pay

## 2018-02-04 MED ORDER — CELECOXIB 200 MG PO CAPS: ORAL_CAPSULE | ORAL | 0 refills | Status: DC

## 2018-02-25 ENCOUNTER — Other Ambulatory Visit: Payer: Self-pay | Admitting: Internal Medicine

## 2018-02-25 NOTE — Telephone Encounter (Signed)
Copied from CRM [id number]. Topic: Quick Communication - Rx Refill/Question >> Feb 25, 2018 10:29 AM Burchel, Abbi R wrote: Medication: traMADol (ULTRAM) 50 MG tablet  [facility] [phone][id number] [facility] - [address] AT [facility]  [phone] (Fax)    Pt was  advised that RX refills may take up to 3 business days. We ask that you follow-up with your pharmacy.

## 2018-02-27 MED ORDER — TRAMADOL HCL 50 MG PO TABS: 50.0000 mg | ORAL_TABLET | Freq: Four times a day (QID) | ORAL | 2 refills | Status: DC | PRN

## 2018-03-01 ENCOUNTER — Other Ambulatory Visit: Payer: Self-pay

## 2018-03-01 ENCOUNTER — Emergency Department: Payer: BLUE CROSS/BLUE SHIELD

## 2018-03-01 ENCOUNTER — Ambulatory Visit: Payer: Self-pay | Admitting: Internal Medicine

## 2018-03-01 ENCOUNTER — Encounter: Payer: Self-pay | Admitting: Internal Medicine

## 2018-03-01 ENCOUNTER — Ambulatory Visit: Payer: BLUE CROSS/BLUE SHIELD | Admitting: Internal Medicine

## 2018-03-01 ENCOUNTER — Encounter: Payer: Self-pay | Admitting: *Deleted

## 2018-03-01 ENCOUNTER — Emergency Department
Admission: EM | Admit: 2018-03-01 | Discharge: 2018-03-01 | Disposition: A | Discharge status: Home / Self Care | Payer: BLUE CROSS/BLUE SHIELD | Attending: Emergency Medicine | Admitting: Emergency Medicine

## 2018-03-01 DIAGNOSIS — E785 Hyperlipidemia, unspecified: Secondary | ICD-10-CM | POA: Insufficient documentation

## 2018-03-01 DIAGNOSIS — E860 Dehydration: Secondary | ICD-10-CM

## 2018-03-01 DIAGNOSIS — J1089 Influenza due to other identified influenza virus with other manifestations: Secondary | ICD-10-CM | POA: Diagnosis not present

## 2018-03-01 DIAGNOSIS — Z79899 Other long term (current) drug therapy: Secondary | ICD-10-CM | POA: Diagnosis not present

## 2018-03-01 DIAGNOSIS — J111 Influenza due to unidentified influenza virus with other respiratory manifestations: Secondary | ICD-10-CM

## 2018-03-01 DIAGNOSIS — R5383 Other fatigue: Secondary | ICD-10-CM | POA: Diagnosis present

## 2018-03-01 DIAGNOSIS — I10 Essential (primary) hypertension: Secondary | ICD-10-CM | POA: Insufficient documentation

## 2018-03-01 DIAGNOSIS — I959 Hypotension, unspecified: Secondary | ICD-10-CM | POA: Diagnosis not present

## 2018-03-01 LAB — CBC WITH DIFFERENTIAL/PLATELET
ABS IMMATURE GRANULOCYTES: 0.05 10*3/uL (ref 0.00–0.07)
BASOS PCT: 1 %
Basophils Absolute: 0.1 10*3/uL (ref 0.0–0.1)
Eosinophils Absolute: 0.1 10*3/uL (ref 0.0–0.5)
Eosinophils Relative: 1 %
HCT: 42.5 % (ref 39.0–52.0)
Hemoglobin: 14.5 g/dL (ref 13.0–17.0)
Immature Granulocytes: 1 %
Lymphocytes Relative: 22 %
Lymphs Abs: 1.2 10*3/uL (ref 0.7–4.0)
MCH: 32 pg (ref 26.0–34.0)
MCHC: 34.1 g/dL (ref 30.0–36.0)
MCV: 93.8 fL (ref 80.0–100.0)
MONO ABS: 0.8 10*3/uL (ref 0.1–1.0)
Monocytes Relative: 15 %
NEUTROS ABS: 3.1 10*3/uL (ref 1.7–7.7)
Neutrophils Relative %: 60 %
PLATELETS: 203 10*3/uL (ref 150–400)
RBC: 4.53 MIL/uL (ref 4.22–5.81)
RDW: 12.6 % (ref 11.5–15.5)
WBC: 5.3 10*3/uL (ref 4.0–10.5)
nRBC: 0 % (ref 0.0–0.2)

## 2018-03-01 LAB — URINALYSIS, ROUTINE W REFLEX MICROSCOPIC
Bacteria, UA: NONE SEEN
Bilirubin Urine: NEGATIVE
Glucose, UA: NEGATIVE mg/dL
Ketones, ur: NEGATIVE mg/dL
Leukocytes, UA: NEGATIVE
Nitrite: NEGATIVE
PROTEIN: NEGATIVE mg/dL
Specific Gravity, Urine: 1.004 — ABNORMAL LOW (ref 1.005–1.030)
pH: 7 (ref 5.0–8.0)

## 2018-03-01 LAB — COMPREHENSIVE METABOLIC PANEL
ALT: 41 U/L (ref 0–44)
ANION GAP: 12 (ref 5–15)
AST: 56 U/L — ABNORMAL HIGH (ref 15–41)
Albumin: 3.7 g/dL (ref 3.5–5.0)
Alkaline Phosphatase: 51 U/L (ref 38–126)
BILIRUBIN TOTAL: 0.8 mg/dL (ref 0.3–1.2)
BUN: 21 mg/dL — ABNORMAL HIGH (ref 6–20)
CO2: 26 mmol/L (ref 22–32)
Calcium: 8.2 mg/dL — ABNORMAL LOW (ref 8.9–10.3)
Chloride: 92 mmol/L — ABNORMAL LOW (ref 98–111)
Creatinine, Ser: 2.06 mg/dL — ABNORMAL HIGH (ref 0.61–1.24)
GFR, EST AFRICAN AMERICAN: 41 mL/min — AB (ref >60)
GFR, EST NON AFRICAN AMERICAN: 35 mL/min — AB (ref >60)
Glucose, Bld: 141 mg/dL — ABNORMAL HIGH (ref 70–99)
POTASSIUM: 2.6 mmol/L — AB (ref 3.5–5.1)
Sodium: 130 mmol/L — ABNORMAL LOW (ref 135–145)
TOTAL PROTEIN: 7 g/dL (ref 6.5–8.1)

## 2018-03-01 LAB — BASIC METABOLIC PANEL
Anion gap: 7 (ref 5–15)
BUN: 21 mg/dL — ABNORMAL HIGH (ref 6–20)
CO2: 25 mmol/L (ref 22–32)
CREATININE: 1.5 mg/dL — AB (ref 0.61–1.24)
Calcium: 7.3 mg/dL — ABNORMAL LOW (ref 8.9–10.3)
Chloride: 99 mmol/L (ref 98–111)
GFR calc non Af Amer: 52 mL/min — ABNORMAL LOW (ref >60)
GFR, EST AFRICAN AMERICAN: 60 mL/min — AB (ref >60)
Glucose, Bld: 133 mg/dL — ABNORMAL HIGH (ref 70–99)
Potassium: 2.7 mmol/L — CL (ref 3.5–5.1)
Sodium: 131 mmol/L — ABNORMAL LOW (ref 135–145)

## 2018-03-01 LAB — CG4 I-STAT (LACTIC ACID)
Lactic Acid, Venous: 2.82 mmol/L (ref 0.5–1.9)
Lactic Acid, Venous: 1.1 mmol/L (ref 0.5–1.9)

## 2018-03-01 MED ORDER — SODIUM CHLORIDE 0.9 % IV BOLUS (SEPSIS)
500.0000 mL | Freq: Once | INTRAVENOUS | Status: AC
  Administered 2018-03-01: 500 mL via INTRAVENOUS

## 2018-03-01 MED ORDER — OSELTAMIVIR PHOSPHATE 75 MG PO CAPS: 75.0000 mg | ORAL_CAPSULE | Freq: Two times a day (BID) | ORAL | 0 refills | Status: AC

## 2018-03-01 MED ORDER — POTASSIUM CHLORIDE CRYS ER 20 MEQ PO TBCR
40.0000 meq | EXTENDED_RELEASE_TABLET | Freq: Once | ORAL | Status: AC
  Administered 2018-03-01: 40 meq via ORAL
  Filled 2018-03-01: qty 2

## 2018-03-01 MED ORDER — POTASSIUM CHLORIDE CRYS ER 20 MEQ PO TBCR: 20.0000 meq | EXTENDED_RELEASE_TABLET | Freq: Two times a day (BID) | ORAL | 0 refills | Status: DC

## 2018-03-01 MED ORDER — POTASSIUM CHLORIDE 10 MEQ/100ML IV SOLN
10.0000 meq | Freq: Once | INTRAVENOUS | Status: AC
  Administered 2018-03-01: 10 meq via INTRAVENOUS
  Filled 2018-03-01: qty 100

## 2018-03-01 MED ORDER — SODIUM CHLORIDE 0.9 % IV BOLUS (SEPSIS)
1000.0000 mL | Freq: Once | INTRAVENOUS | Status: AC
  Administered 2018-03-01: 1000 mL via INTRAVENOUS

## 2018-03-01 MED ORDER — OSELTAMIVIR PHOSPHATE 75 MG PO CAPS
75.0000 mg | ORAL_CAPSULE | Freq: Once | ORAL | Status: AC
  Administered 2018-03-01: 75 mg via ORAL
  Filled 2018-03-01: qty 1

## 2018-03-01 MED ORDER — ONDANSETRON 4 MG PO TBDP: 4.0000 mg | ORAL_TABLET | Freq: Three times a day (TID) | ORAL | 0 refills | Status: DC | PRN

## 2018-03-01 NOTE — Assessment & Plan Note (Signed)
Secondary to dehydration and influenza B,  Possibly sepsis.  He is unsafe to drive himself to ER due to AMS. .  EMS called to transport patient to ER at [facility]

## 2018-03-01 NOTE — Progress Notes (Addendum)
Subjective:  Patient ID: Jeff [name], male    DOB: [birth year]  Age: 58 y.o. MRN: [id number]  CC: The encounter diagnosis was Acute hypotension.  HPI Jeff [name] presents for regularly shceduled visit but has been acutely ill with fevers,  Body aches,  Nausea without vomiting,  And recurrent loose stools for the past 4-5 days.  He has had decreased PO intake.  During intake he became presyncopal and was helped to the examining table and placed in a supine position.  Supine pulse was not palpable and BP was 80/62 at best.   He has taken metoprolol,  hct and losartan today for history of hypertension.  Influenza B rapid test positive today  Past Medical History:  Diagnosis Date  . Arthritis    KNEES  . Cataract    REMOVED BILATERAL  . Claustrophobia   . GERD (gastroesophageal reflux disease)    with certain foods  . Glaucoma (increased eye pressure)   . Headache(784.0)    07/11/13- none in a long time  . Hepatic steatosis   . Hyperglycemia   . Hyperlipidemia   . Hypertension    no meds  . Sleep apnea     Outpatient Medications Prior to Visit  Medication Sig Dispense Refill  . allopurinol (ZYLOPRIM) 300 MG tablet TAKE 1/2 TABLETS (150 MG TOTAL) BY MOUTH DAILY. 90 tablet 0  . buPROPion (WELLBUTRIN SR) 150 MG 12 hr tablet Take 1 tablet (150 mg total) by mouth 2 (two) times daily. 180 tablet 0  . celecoxib (CELEBREX) 200 MG capsule TAKE 1 CAPSULE BY MOUTH EVERY DAY 90 capsule 0  . colchicine 0.6 MG tablet TAKE HOURLY UNTIL GOUT IMPROVED/DIARRHEA, ON FIRST DAY OF TREATMENT.TAKE DAILY THEREAFTER AS NEEDED 30 tablet 3  . dorzolamide-timolol (COSOPT) 22.3-6.8 MG/ML ophthalmic solution PLACE 1 DROP INTO THE RIGHT EYE 2 (TWO) TIMES DAILY.  11  . hydrochlorothiazide (HYDRODIURIL) 25 MG tablet Take 1 tablet (25 mg total) by mouth daily. 90 tablet 3  . losartan (COZAAR) 100 MG tablet Take 1 tablet (100 mg total) by mouth daily. 90 tablet 3  . metoprolol succinate (TOPROL-XL) 25  MG 24 hr tablet TAKE 1 TABLET (25 MG TOTAL) BY MOUTH DAILY. 90 tablet 1  . pantoprazole (PROTONIX) 40 MG tablet TAKE 1 TABLET BY MOUTH TWICE A DAY 180 tablet 1  . rOPINIRole (REQUIP) 0.5 MG tablet Take 1 tablet (0.5 mg total) by mouth at bedtime. 90 tablet 1  . sildenafil (REVATIO) 20 MG tablet Take 1 tablet (20 mg total) by mouth 3 (three) times daily. 30 tablet 0  . silodosin (RAPAFLO) 8 MG CAPS capsule TAKE 1 CAPSULE BY MOUTH EVERYDAY AT BEDTIME  11  . simvastatin (ZOCOR) 20 MG tablet TAKE 1 TABLET BY MOUTH EVERYDAY AT BEDTIME 90 tablet 0  . tamsulosin (FLOMAX) 0.4 MG CAPS capsule Take 1 capsule (0.4 mg total) by mouth daily. 90 capsule 3  . tiZANidine (ZANAFLEX) 4 MG tablet TAKE 1 TABLET (4 MG TOTAL) BY MOUTH AT BEDTIME. 90 tablet 1  . traMADol (ULTRAM) 50 MG tablet Take 1 tablet (50 mg total) by mouth every 6 (six) hours as needed for moderate pain. 120 tablet 2  . traZODone (DESYREL) 50 MG tablet Take 1 tablet (50 mg total) by mouth daily as needed for sleep. 90 tablet 1   Facility-Administered Medications Prior to Visit  Medication Dose Route Frequency Provider Last Rate Last Dose  . lidocaine (XYLOCAINE) 1 % (with pres) injection 4 mL  4  mL Other Once Sherlene Shamsullo, Teresa L, MD      . triamcinolone acetonide (KENALOG-40) injection 40 mg  40 mg Intra-articular Once Sherlene Shamsullo, Teresa L, MD        Review of Systems;  Patient denies , unintentional weight loss, skin rash, eye pain, sinus congestion and sinus pain, sore throat, dysphagia,  hemoptysis , cough, dyspnea, wheezing, chest pain, palpitations, orthopnea, edema, abdominal pain,  melena,  constipation, flank pain, dysuria, hematuria, urinary  Frequency, nocturia, numbness, tingling, seizures,  Focal weakness, Loss of consciousness,  Tremor, insomnia, depression, anxiety, and suicidal ideation.      Objective:  BP (!) 80/62 (BP Location: Left Arm, Patient Position: Supine, Cuff Size: Normal)   Wt 180 lb (81.6 kg)   BMI 25.10 kg/m   BP  Readings from Last 3 Encounters:  03/01/18 (!) 80/62  12/28/17 (!) 144/88  07/13/17 108/78    Wt Readings from Last 3 Encounters:  03/01/18 180 lb (81.6 kg)  12/28/17 195 lb 12.8 oz (88.8 kg)  07/13/17 194 lb 6.4 oz (88.2 kg)    General appearance: acutely ill,  Dazed ,  appears stated age Ears: normal TM's and external ear canals both ears Throat: lips, mucosa, and tongue normal; teeth and gums normal Neck: no adenopathy, no carotid bruit, supple, symmetrical, trachea midline and thyroid not enlarged, symmetric, no tenderness/mass/nodules Back: symmetric, no curvature. ROM normal. No CVA tenderness. Lungs: clear to auscultation bilaterally Heart: regular rate and rhythm, S1, S2 normal, no murmur, click, rub or gallop Abdomen: soft, non-tender; bowel sounds normal; no masses,  no organomegaly Pulses: 2+ and symmetric Skin: Skin color pale,  Texture dry torgor poor with TENTING .No rashes or lesions Lymph nodes: Cervical, supraclavicular, and axillary nodes normal.  Lab Results  Component Value Date   HGBA1C 5.6 12/28/2017   HGBA1C 5.8 07/13/2017   HGBA1C 5.8 04/13/2017    Lab Results  Component Value Date   CREATININE 1.01 12/28/2017   CREATININE 0.93 07/13/2017   CREATININE 0.92 04/13/2017    Lab Results  Component Value Date   WBC 4.7 07/13/2017   HGB 15.1 07/13/2017   HCT 43.8 07/13/2017   PLT 272.0 07/13/2017   GLUCOSE 116 (H) 12/28/2017   CHOL 206 (H) 10/31/2016   TRIG 184.0 (H) 10/31/2016   HDL 67.50 10/31/2016   LDLDIRECT 144.0 08/06/2012   LDLCALC 102 (H) 10/31/2016   ALT 45 12/28/2017   AST 60 (H) 12/28/2017   NA 138 12/28/2017   K 4.4 12/28/2017   CL 104 12/28/2017   CREATININE 1.01 12/28/2017   BUN 14 12/28/2017   CO2 29 12/28/2017   TSH 2.04 07/13/2017   PSA 3.28 07/13/2017   HGBA1C 5.6 12/28/2017   MICROALBUR 1.3 05/14/2014    Ct Cardiac Scoring  Addendum Date: 12/03/2017   ADDENDUM REPORT: 12/03/2017 08:25 CLINICAL DATA:  Risk  stratification EXAM: Coronary Calcium Score TECHNIQUE: The patient was scanned on a Siemens Somatom 64 slice scanner. Axial non-contrast 3 mm slices were carried out through the heart. The data set was analyzed on a dedicated work station and scored using the [place] method. FINDINGS: Non-cardiac: See [facility] from [facility]. Ascending Aorta: Normal diameter 3.0 cm Pericardium: Normal Coronary arteries: Dense calcification of the proximal and mid LAD Mild calcification of the proximal RCA IMPRESSION: Coronary calcium score of 451 . This was 95 th percentile for age and sex matched control. Consider f/u stress testing / perfusion study given how high calcium score is to r/o anterior wall  ischemia Charlton Haws Electronically Signed   By: Charlton Haws M.D.   On: 12/03/2017 08:25   Result Date: 12/03/2017 EXAM: OVER-READ INTERPRETATION  CT CHEST The following report is an over-read performed by radiologist Dr. Charlett Nose of [facility] on 11/30/2017. This over-read does not include interpretation of cardiac or coronary anatomy or pathology. The coronary calcium score interpretation by the cardiologist is attached. COMPARISON:  None. FINDINGS: Vascular: Heart is normal size.  Visualized aorta is normal caliber. Mediastinum/Nodes: No adenopathy in the lower mediastinum or hila. Lungs/Pleura: Visualized lungs clear.  No effusions. Upper Abdomen: Imaging into the upper abdomen shows no acute findings. Musculoskeletal: Chest wall soft tissues are unremarkable. No acute bony abnormality. IMPRESSION: No acute or significant extracardiac abnormality. Electronically Signed: By: Charlett Nose M.D. On: 11/30/2017 13:37    Assessment & Plan:   Problem List Items Addressed This Visit    Acute hypotension    Secondary to dehydration and influenza B,  Possibly sepsis.  He is unsafe to drive himself to ER due to AMS. .  EMS called to transport patient to ER at [facility]          I am having  Keturah Barre maintain his tiZANidine, allopurinol, traZODone, tamsulosin, dorzolamide-timolol, colchicine, sildenafil, buPROPion, rOPINIRole, silodosin, metoprolol succinate, pantoprazole, simvastatin, losartan, hydrochlorothiazide, celecoxib, and traMADol. We will continue to administer triamcinolone acetonide and lidocaine.  No orders of the defined types were placed in this encounter.   There are no discontinued medications.  Follow-up: No follow-ups on file.   Sherlene Shams, MD

## 2018-03-01 NOTE — ED Triage Notes (Signed)
Pt to ED via EMS from [facility] medical for routine appointment. Pt has had generalized malaise since Monday with nausea and body aches. No vomiting or diarrhea. pT tested positive for influenza B. Hypotensive with EMS 4mg  of Zofran given in route to ED.  97.3 F 64/41

## 2018-03-01 NOTE — ED Provider Notes (Signed)
[facility] Emergency Department Provider Note  Time seen: 3:24 PM  I have reviewed the triage vital signs and the nursing notes.   HISTORY  Chief Complaint Influenza    HPI Jeff [name] is a 58 y.o. male with a past medical history of gastric reflux, hypertension, hyperlipidemia, borderline diabetes, presents to the emergency department from his doctor's office with generalized fatigue weakness nausea found to be hypotensive and influenza B positive.  According to the patient for the past 4 days he has been feeling weak and nauseated.  States frequent cough rarely with sputum production.  States slight chest discomfort described more as a tightness sensation.  Denies any abdominal pain.  Does state loose stool as well.  Reported fevers at home although afebrile currently.  Patient went to his primary care doctor today for evaluation was found to be hypotensive 80/60, work-up showed influenza B positive.  Patient sent to the emergency department for evaluation.  Upon arrival patient is awake and alert and oriented.  States he feels bad, but describes his feeling is generalized fatigue weakness nausea body aches.   Past Medical History:  Diagnosis Date  . Arthritis    KNEES  . Cataract    REMOVED BILATERAL  . Claustrophobia   . GERD (gastroesophageal reflux disease)    with certain foods  . Glaucoma (increased eye pressure)   . Headache(784.0)    07/11/13- none in a long time  . Hepatic steatosis   . Hyperglycemia   . Hyperlipidemia   . Hypertension    no meds  . Sleep apnea     Patient Active Problem List   Diagnosis Date Noted  . Acute hypotension 03/01/2018  . Pain in right hand 12/30/2017  . Coronary artery disease due to calcified coronary lesion 12/30/2017  . Abnormal screening cardiac CT 12/03/2017  . Acute non-recurrent ethmoidal sinusitis 04/15/2017  . Restless legs syndrome 11/02/2016  . Flushing 10/31/2016  . Blind left eye  08/29/2016  . Displaced fracture of base of third metacarpal bone, right hand, initial encounter for closed fracture 08/29/2016  . Multiple thyroid nodules 07/25/2016  . History of alcohol abuse 08/15/2015  . Benign prostatic hypertrophy (BPH) with incomplete bladder emptying 08/13/2015  . Hyperlipidemia 07/15/2015  . Dyspnea 07/15/2015  . Major depressive disorder with single episode 07/11/2015  . Cognitive complaints 05/24/2014  . Cervicalgia of occipito-atlanto-axial region 05/24/2014  . Hepatic steatosis 05/17/2014  . Impaired fasting glucose 05/17/2014  . Chronic glaucoma 10/21/2013  . New onset of headaches after age 26 09/12/2013  . Insomnia 11/17/2012  . Erectile dysfunction of nonorganic origin 09/04/2012  . Hx of acute gouty arthritis 09/03/2012  . Encounter for preventive health examination 09/03/2012  . Screening for prostate cancer 09/03/2012  . Urinary hesitancy 07/28/2012  . Hoarseness of voice 07/28/2012  . Polyarthritis 07/28/2012  . Glaucoma (increased eye pressure) 03/22/2011  . HTN (hypertension) 03/22/2011    Past Surgical History:  Procedure Laterality Date  . CATARACT EXTRACTION W/PHACO Left 07/16/2013   Procedure: CATARACT EXTRACTION PHACO AND INTRAOCULAR LENS PLACEMENT (IOC) LEFT EYE;  Surgeon: Chalmers Guest, MD;  Location: [facility] OR;  Service: Ophthalmology;  Laterality: Left;  . CATARACT EXTRACTION W/PHACO Right 10/29/2013   Procedure: CATARACT EXTRACTION PHACO AND INTRAOCULAR LENS PLACEMENT (IOC);  Surgeon: Chalmers Guest, MD;  Location: [facility] OR;  Service: Ophthalmology;  Laterality: Right;  . EYE SURGERY     2 on left eye  . FINGER SURGERY Right   . MINI SHUNT  INSERTION  03/09/2011   Procedure: INSERTION OF MINI SHUNT;  Surgeon: Chalmers Guestoy Whitaker, MD;  [facility] OR;  Service: Ophthalmology;  Laterality: Left;    Prior to Admission medications   Medication Sig Start Date End Date Taking? Authorizing Provider  allopurinol (ZYLOPRIM) 300 MG tablet TAKE 1/2 TABLETS  (150 MG TOTAL) BY MOUTH DAILY. 05/19/15   Sherlene Shamsullo, Teresa L, MD  buPROPion (WELLBUTRIN SR) 150 MG 12 hr tablet Take 1 tablet (150 mg total) by mouth 2 (two) times daily. 12/14/16   Sherlene Shamsullo, Teresa L, MD  celecoxib (CELEBREX) 200 MG capsule TAKE 1 CAPSULE BY MOUTH EVERY DAY 02/04/18   Sherlene Shamsullo, Teresa L, MD  colchicine 0.6 MG tablet TAKE HOURLY UNTIL GOUT IMPROVED/DIARRHEA, ON FIRST DAY OF TREATMENT.TAKE DAILY THEREAFTER AS NEEDED 08/28/16   Sherlene Shamsullo, Teresa L, MD  dorzolamide-timolol (COSOPT) 22.3-6.8 MG/ML ophthalmic solution PLACE 1 DROP INTO THE RIGHT EYE 2 (TWO) TIMES DAILY. 04/24/16   [provider]  hydrochlorothiazide (HYDRODIURIL) 25 MG tablet Take 1 tablet (25 mg total) by mouth daily. 12/28/17   Sherlene Shamsullo, Teresa L, MD  losartan (COZAAR) 100 MG tablet Take 1 tablet (100 mg total) by mouth daily. 12/28/17   Sherlene Shamsullo, Teresa L, MD  metoprolol succinate (TOPROL-XL) 25 MG 24 hr tablet TAKE 1 TABLET (25 MG TOTAL) BY MOUTH DAILY. 08/29/17   Sherlene Shamsullo, Teresa L, MD  pantoprazole (PROTONIX) 40 MG tablet TAKE 1 TABLET BY MOUTH TWICE A DAY 09/13/17   Sherlene Shamsullo, Teresa L, MD  rOPINIRole (REQUIP) 0.5 MG tablet Take 1 tablet (0.5 mg total) by mouth at bedtime. 04/13/17   Sherlene Shamsullo, Teresa L, MD  sildenafil (REVATIO) 20 MG tablet Take 1 tablet (20 mg total) by mouth 3 (three) times daily. 10/31/16   Sherlene Shamsullo, Teresa L, MD  silodosin (RAPAFLO) 8 MG CAPS capsule TAKE 1 CAPSULE BY MOUTH EVERYDAY AT BEDTIME 06/13/17   [provider]  simvastatin (ZOCOR) 20 MG tablet TAKE 1 TABLET BY MOUTH EVERYDAY AT BEDTIME 12/07/17   Sherlene Shamsullo, Teresa L, MD  tamsulosin (FLOMAX) 0.4 MG CAPS capsule Take 1 capsule (0.4 mg total) by mouth daily. 12/20/15   Sherlene Shamsullo, Teresa L, MD  tiZANidine (ZANAFLEX) 4 MG tablet TAKE 1 TABLET (4 MG TOTAL) BY MOUTH AT BEDTIME. 06/22/14   Sherlene Shamsullo, Teresa L, MD  traMADol (ULTRAM) 50 MG tablet Take 1 tablet (50 mg total) by mouth every 6 (six) hours as needed for moderate pain. 02/27/18   Sherlene Shamsullo, Teresa L, MD  traZODone (DESYREL) 50  MG tablet Take 1 tablet (50 mg total) by mouth daily as needed for sleep. 06/03/15   Sherlene Shamsullo, Teresa L, MD    Allergies  Allergen Reactions  . Keflex [Cephalexin] Rash    Family History  Problem Relation Age of Onset  . Asthma Mother   . Hyperlipidemia Mother   . Heart disease Mother   . Hypertension Mother   . Liver cancer Mother   . Thyroid disease Mother   . Asthma Father   . Hyperlipidemia Father   . Heart disease Father   . Stroke Father   . Hypertension Father   . Aneurysm Father   . Aneurysm Brother        non smoker   . Early death Brother   . Asthma Paternal Aunt   . Heart disease Sister   . Early death Sister   . Migraines Neg Hx     Social History Social History   Tobacco Use  . Smoking status: Never Smoker  . Smokeless tobacco: Never  Used  Substance Use Topics  . Alcohol use: Yes    Alcohol/week: 2.0 standard drinks    Types: 2 Cans of beer per week    Comment: daily  . Drug use: No    Review of Systems Constitutional: Reported fever at home.  Positive for generalized fatigue. ENT: Positive for congestion Cardiovascular: Mild chest tightness Respiratory: Positive for shortness of breath.  Positive for cough. Gastrointestinal: Negative for abdominal pain.  Positive for nausea vomiting.  Occasional loose stool. Genitourinary: Negative for urinary compaints Musculoskeletal: Negative for musculoskeletal complaints Skin: Negative for skin complaints  Neurological: Negative for headache All other ROS negative  ____________________________________________   PHYSICAL EXAM:  VITAL SIGNS: ED Triage Vitals  Enc Vitals Group     BP 03/01/18 1512 (!) 83/64     Pulse Rate 03/01/18 1512 83     Resp 03/01/18 1512 (!) 9     Temp 03/01/18 1512 (!) 97.4 F (36.3 C)     Temp Source 03/01/18 1512 Oral     SpO2 03/01/18 1512 99 %     Weight 03/01/18 1505 180 lb (81.6 kg)     Height 03/01/18 1512 5\' 11"  (1.803 m)     Head Circumference --      Peak Flow --       Pain Score 03/01/18 1512 5     Pain Loc --      Pain Edu? --      Excl. in GC? --    Constitutional: Alert and oriented. Well appearing and in no distress. Eyes: Normal exam ENT   Head: Normocephalic and atraumatic.   Mouth/Throat: Mucous membranes are moist. Cardiovascular: Normal rate, regular rhythm Respiratory: Normal respiratory effort without tachypnea nor retractions.  Slight expiratory wheeze in the right lung fields.  No rhonchi or rales. Gastrointestinal: Soft and nontender. No distention.  Musculoskeletal: Nontender with normal range of motion in all extremities. Neurologic:  Normal speech and language. No gross focal neurologic deficits  Skin:  Skin is warm, dry and intact.  Psychiatric: Mood and affect are normal.  ____________________________________________    EKG  EKG viewed and interpreted by myself shows a sinus rhythm 86 bpm with a narrow QRS, normal axis, normal intervals, no concerning ST changes.  ____________________________________________    RADIOLOGY  Chest x-ray negative  ____________________________________________   INITIAL IMPRESSION / ASSESSMENT AND PLAN / ED COURSE  Pertinent labs & imaging results that were available during my care of the patient were reviewed by me and considered in my medical decision making (see chart for details).  Patient presents to the emergency department for generalized fatigue weakness nausea cough and congestion ongoing for the past 4 days  Influenza B positive at his primary care doctor's office along with hypotension was sent to the ER for evaluation.  On arrival patient remains hypotensive 83/64.  Differential would include influenza, pneumonia, sepsis, metabolic abnormality or renal insufficiency, ACS.  We will check labs including cardiac enzymes.  We will begin fluid resuscitation with 2.5 L of fluid.  We will begin Tamiflu treatment.  We will hold off on antibiotics at this time given influenza B  positive at his primary care doctor's office which fits the patient's clinical presentation.  Patient's labs have begun to resolve.  CBC is largely within normal limits.  CMP does show a renal insufficiency as well as hypokalemia.  We will continue with fluid resuscitation we will replete potassium.  Reassuringly patient's blood pressure is improving substantially currently 120/70 patient continues  to receive IV fluids.  Awaiting further lab results.  Chest x-ray is negative.  Patient's labs showed acute renal insufficiency creatinine around 2, however after IV fluids the patient's lactic acid as well as creatinine are improving.  Patient states he feels much better.  I believe the patient will be safe for discharge home with potassium supplements, nausea medication and Tamiflu.  I discussed with the patient the need to drink a significant amount of fluids over the next several days including Gatorade.  Patient will follow-up with his primary care doctor, Dr. Darrick Huntsman on Monday for recheck of labs.  I discussed with the patient very low threshold for returning to the emergency department if he begins vomiting, feels weaker has a low blood pressure reading at home.  ____________________________________________   FINAL CLINICAL IMPRESSION(S) / ED DIAGNOSES  Influenza Weakness Hypotension   Minna Antis, MD 03/01/18 1935

## 2018-03-01 NOTE — ED Notes (Signed)
Pt able to ambulate tot he bathroom without assistance. No lightheadedness or dizziness reported. Pt alert and verbalized feeling better after receiving the first two liters of NS.

## 2018-03-01 NOTE — Progress Notes (Signed)
CODE SEPSIS - PHARMACY COMMUNICATION  **Broad Spectrum Antibiotics should be administered within 1 hour of Sepsis diagnosis**  Time Code Sepsis Called/Page Received: 1531  Antibiotics Ordered: no abx ordered, oseltamivir ordered  Time of  administration: no abx ordered, Tamiflu given 1717  Additional action taken by pharmacy:  Called RN phone at 1557 no answer Called again at 1612 no answer Touched based with RN at 1630--RN stated she will give med  If necessary, Name of Provider/Nurse Contacted: 1600 confirmed with MD Paduchowski that pt is code sepsis but with flu (Tamiflu ordered)    Marty Heck ,PharmD Clinical Pharmacist  03/01/2018  3:32 PM

## 2018-03-01 NOTE — ED Notes (Signed)
MD at bedside with patient. PT alert and oriented x 4 and able to speak in complete sentences. NO SOB noted. NO cough.

## 2018-03-02 LAB — URINE CULTURE

## 2018-03-04 ENCOUNTER — Telehealth: Payer: Self-pay | Admitting: Internal Medicine

## 2018-03-04 NOTE — Telephone Encounter (Signed)
Copied from CRM [id number]. Topic: Quick Communication - See Telephone Encounter >> Mar 04, 2018  3:50 PM Jeff [name] wrote: CRM for notification. See Telephone encounter for: 03/04/18.  Patient is calling because he was advised to schedule [name] hospital follow up before returning back to work. Soonest appt was 03/15/18.  Calling to see if he can [name] sooner appt.  Patient was diagnosed for Influenza B at the hospital Please advise

## 2018-03-05 NOTE — Telephone Encounter (Signed)
Pt calling back. Please advise.

## 2018-03-05 NOTE — Telephone Encounter (Signed)
[place] no answer on backdoor line

## 2018-03-05 NOTE — Telephone Encounter (Signed)
Left message for patient to return call to office. Please contact office ,.

## 2018-03-06 ENCOUNTER — Other Ambulatory Visit: Payer: Self-pay | Admitting: Internal Medicine

## 2018-03-06 LAB — CULTURE, BLOOD (ROUTINE X 2)
Culture: NO GROWTH
Culture: NO GROWTH
Special Requests: ADEQUATE

## 2018-03-06 NOTE — Telephone Encounter (Signed)
Left message for patient to return call to office. 

## 2018-03-06 NOTE — Telephone Encounter (Signed)
Scheduled

## 2018-03-08 ENCOUNTER — Ambulatory Visit: Payer: BLUE CROSS/BLUE SHIELD | Admitting: Internal Medicine

## 2018-03-08 ENCOUNTER — Encounter: Payer: Self-pay | Admitting: Internal Medicine

## 2018-03-08 VITALS — BP 102/70 | HR 90 | Temp 98.2°F | Resp 15 | Ht 71.0 in | Wt 180.8 lb

## 2018-03-08 DIAGNOSIS — N179 Acute kidney failure, unspecified: Secondary | ICD-10-CM

## 2018-03-08 DIAGNOSIS — R931 Abnormal findings on diagnostic imaging of heart and coronary circulation: Secondary | ICD-10-CM | POA: Diagnosis not present

## 2018-03-08 DIAGNOSIS — E042 Nontoxic multinodular goiter: Secondary | ICD-10-CM

## 2018-03-08 DIAGNOSIS — R0609 Other forms of dyspnea: Secondary | ICD-10-CM

## 2018-03-08 DIAGNOSIS — F5104 Psychophysiologic insomnia: Secondary | ICD-10-CM

## 2018-03-08 DIAGNOSIS — E785 Hyperlipidemia, unspecified: Secondary | ICD-10-CM | POA: Diagnosis not present

## 2018-03-08 DIAGNOSIS — I2584 Coronary atherosclerosis due to calcified coronary lesion: Secondary | ICD-10-CM

## 2018-03-08 DIAGNOSIS — I2510 Atherosclerotic heart disease of native coronary artery without angina pectoris: Secondary | ICD-10-CM

## 2018-03-08 DIAGNOSIS — E876 Hypokalemia: Secondary | ICD-10-CM

## 2018-03-08 MED ORDER — ROSUVASTATIN CALCIUM 20 MG PO TABS: 20.0000 mg | ORAL_TABLET | Freq: Every day | ORAL | 1 refills | Status: DC

## 2018-03-08 MED ORDER — BENZONATATE 200 MG PO CAPS: 200.0000 mg | ORAL_CAPSULE | Freq: Three times a day (TID) | ORAL | 0 refills | Status: DC | PRN

## 2018-03-08 MED ORDER — TRAZODONE HCL 50 MG PO TABS: 50.0000 mg | ORAL_TABLET | Freq: Every day | ORAL | 1 refills | Status: DC | PRN

## 2018-03-08 NOTE — Progress Notes (Signed)
Subjective:  Patient ID: Jeff [name], male    DOB: 09/14/1960  Age: 58 y.o. MRN: [id number]  CC: The primary encounter diagnosis was Acute renal failure, unspecified acute renal failure type (HCC). Diagnoses of Abnormal screening cardiac CT, Hyperlipidemia, unspecified hyperlipidemia type, Multiple thyroid nodules, Dyspnea on exertion, Coronary artery disease due to calcified coronary lesion, Psychophysiological insomnia, and Hypokalemia due to excessive gastrointestinal loss of potassium were also pertinent to this visit.  HPI Jeff [name] presents for ER follow up on dehydration and hypotension secondary to influenza B.  AT last scheduled follow up  visit on Jan 10 he was noted to e lethargic and severely hypotensive . Influenza B positive.  He was sent via EMS to ER for emergent treatment.  Lactic acid level was elevated ,  He was noted to be hypokalemic and in acute renal failure.  He was treated with IV fluids and oral potassium . Blood cultures were not done.  He was discharged home on Tamiflu and oral potassium.  He states that he has finished taking the tamiflu,  Drinking a lot of  water  And gatorade , and taking the potassium but not twice daily due to forgetting. Labs, chest x ray and  EKG reviewed   He feels generally better.still having  A cough productive of thick tenacious sputum.   He requests management of the following issues:  He is having trouble sleeping.  Trouble initiating sleep,  Then sleeping too late. Has not used alcohol in 2 weeks, but if he drinks he averages 3 to 4  Beers per night  .  CAD:  His coronary calcium score was done Oct 2019 and over 400 (95th percentile) Has not seen his cardiologist Dr Jeff [name] in 3 years . At that time an ECHO noted diastolic dysfunction,  Normal wall motion and trivial MR.  Stress test was ordered but not done.   Left shoulder pain:  Present since his work related injury but apparently not addressed at the time  Due to  other more obvious injuries to his right hand. .    Outpatient Medications Prior to Visit  Medication Sig Dispense Refill  . allopurinol (ZYLOPRIM) 300 MG tablet TAKE 1/2 TABLETS (150 MG TOTAL) BY MOUTH DAILY. 90 tablet 0  . buPROPion (WELLBUTRIN SR) 150 MG 12 hr tablet Take 1 tablet (150 mg total) by mouth 2 (two) times daily. 180 tablet 0  . celecoxib (CELEBREX) 200 MG capsule TAKE 1 CAPSULE BY MOUTH EVERY DAY 90 capsule 0  . colchicine 0.6 MG tablet TAKE HOURLY UNTIL GOUT IMPROVED/DIARRHEA, ON FIRST DAY OF TREATMENT.TAKE DAILY THEREAFTER AS NEEDED 30 tablet 3  . dorzolamide-timolol (COSOPT) 22.3-6.8 MG/ML ophthalmic solution PLACE 1 DROP INTO THE RIGHT EYE 2 (TWO) TIMES DAILY.  11  . hydrochlorothiazide (HYDRODIURIL) 25 MG tablet Take 1 tablet (25 mg total) by mouth daily. 90 tablet 3  . losartan (COZAAR) 100 MG tablet Take 1 tablet (100 mg total) by mouth daily. 90 tablet 3  . metoprolol succinate (TOPROL-XL) 25 MG 24 hr tablet TAKE 1 TABLET (25 MG TOTAL) BY MOUTH DAILY. 90 tablet 1  . ondansetron (ZOFRAN ODT) 4 MG disintegrating tablet Take 1 tablet (4 mg total) by mouth every 8 (eight) hours as needed for nausea or vomiting. 20 tablet 0  . pantoprazole (PROTONIX) 40 MG tablet TAKE 1 TABLET BY MOUTH TWICE A DAY 180 tablet 1  . potassium chloride SA (K-DUR,KLOR-CON) 20 MEQ tablet Take 1 tablet (20 mEq total)  by mouth 2 (two) times daily. 14 tablet 0  . rOPINIRole (REQUIP) 0.5 MG tablet Take 1 tablet (0.5 mg total) by mouth at bedtime. 90 tablet 1  . sildenafil (REVATIO) 20 MG tablet Take 1 tablet (20 mg total) by mouth 3 (three) times daily. 30 tablet 0  . silodosin (RAPAFLO) 8 MG CAPS capsule TAKE 1 CAPSULE BY MOUTH EVERYDAY AT BEDTIME  11  . tamsulosin (FLOMAX) 0.4 MG CAPS capsule Take 1 capsule (0.4 mg total) by mouth daily. 90 capsule 3  . tiZANidine (ZANAFLEX) 4 MG tablet TAKE 1 TABLET (4 MG TOTAL) BY MOUTH AT BEDTIME. 90 tablet 1  . traMADol (ULTRAM) 50 MG tablet Take 1 tablet (50 mg  total) by mouth every 6 (six) hours as needed for moderate pain. 120 tablet 2  . simvastatin (ZOCOR) 20 MG tablet TAKE 1 TABLET BY MOUTH EVERYDAY AT BEDTIME 90 tablet 0  . traZODone (DESYREL) 50 MG tablet Take 1 tablet (50 mg total) by mouth daily as needed for sleep. 90 tablet 1   Facility-Administered Medications Prior to Visit  Medication Dose Route Frequency Provider Last Rate Last Dose  . lidocaine (XYLOCAINE) 1 % (with pres) injection 4 mL  4 mL Other Once Sherlene Shams, MD      . triamcinolone acetonide (KENALOG-40) injection 40 mg  40 mg Intra-articular Once Sherlene Shams, MD        Review of Systems;  Patient denies headache, fevers, malaise, unintentional weight loss, skin rash, eye pain, sinus congestion and sinus pain, sore throat, dysphagia,  hemoptysis , cough, dyspnea, wheezing, chest pain, palpitations, orthopnea, edema, abdominal pain, nausea, melena, diarrhea, constipation, flank pain, dysuria, hematuria, urinary  Frequency, nocturia, numbness, tingling, seizures,  Focal weakness, Loss of consciousness,  Tremor, insomnia, depression, anxiety, and suicidal ideation.      Objective:  BP 102/70 (BP Location: Left Arm, Patient Position: Sitting, Cuff Size: Large)   Pulse 90   Temp 98.2 F (36.8 C) (Oral)   Resp 15   Ht 5\' 11"  (1.803 m)   Wt 180 lb 12.8 oz (82 kg)   BMI 25.22 kg/m   BP Readings from Last 3 Encounters:  03/08/18 102/70  03/01/18 126/87  03/01/18 (!) 80/62    Wt Readings from Last 3 Encounters:  03/08/18 180 lb 12.8 oz (82 kg)  03/01/18 180 lb (81.6 kg)  03/01/18 180 lb (81.6 kg)    General appearance: alert, cooperative and appears stated age Ears: normal TM's and external ear canals both ears Throat: lips, mucosa, and tongue normal; teeth and gums normal Neck: no adenopathy, no carotid bruit, supple, symmetrical, trachea midline and thyroid not enlarged, symmetric, no tenderness/mass/nodules Back: symmetric, no curvature. ROM normal. No CVA  tenderness. Lungs: clear to auscultation bilaterally Heart: regular rate and rhythm, S1, S2 normal, no murmur, click, rub or gallop Abdomen: soft, non-tender; bowel sounds normal; no masses,  no organomegaly Pulses: 2+ and symmetric Skin: Skin color, texture, turgor normal. No rashes or lesions Lymph nodes: Cervical, supraclavicular, and axillary nodes normal.  Lab Results  Component Value Date   HGBA1C 5.6 12/28/2017   HGBA1C 5.8 07/13/2017   HGBA1C 5.8 04/13/2017    Lab Results  Component Value Date   CREATININE 0.93 03/08/2018   CREATININE 1.50 (H) 03/01/2018   CREATININE 2.06 (H) 03/01/2018    Lab Results  Component Value Date   WBC 5.3 03/01/2018   HGB 14.5 03/01/2018   HCT 42.5 03/01/2018   PLT 203 03/01/2018  GLUCOSE 118 (H) 03/08/2018   CHOL 176 03/08/2018   TRIG 145 03/08/2018   HDL 48 03/08/2018   LDLDIRECT 144.0 08/06/2012   LDLCALC 103 (H) 03/08/2018   ALT 41 03/01/2018   AST 56 (H) 03/01/2018   NA 140 03/08/2018   K 4.4 03/08/2018   CL 101 03/08/2018   CREATININE 0.93 03/08/2018   BUN 14 03/08/2018   CO2 26 03/08/2018   TSH 2.04 07/13/2017   PSA 3.28 07/13/2017   HGBA1C 5.6 12/28/2017   MICROALBUR 1.3 05/14/2014    Dg Chest Portable 1 View  Result Date: 03/01/2018 CLINICAL DATA:  Cough and weakness EXAM: PORTABLE CHEST 1 VIEW COMPARISON:  July 24, 2016 FINDINGS: There is no appreciable edema or consolidation. The heart size and pulmonary vascularity are normal. No adenopathy. There is a benign-appearing exostosis arising from the inferior aspect of the mid right clavicle. IMPRESSION: No edema or consolidation. Electronically Signed   By: Bretta BangWilliam  Woodruff III M.D.   On: 03/01/2018 15:38    Assessment & Plan:   Problem List Items Addressed This Visit    Abnormal screening cardiac CT   Relevant Orders   Ambulatory referral to Cardiology   Acute renal failure (ARF) (HCC) - Primary    With severe hypotension and lactic acidosis  Secondary to  Influenza B.  Resolved with IV fluids and oral hydration  Lab Results  Component Value Date   CREATININE 0.93 03/08/2018         Relevant Orders   Basic metabolic panel (Completed)   Coronary artery disease due to calcified coronary lesion    Coronary calcium score  of > 400 on Oct 2019 CT.  I am changing his  statin to high potency.  Continue  metoprolol..  Referring back to Dr Sharol Givenrenwshaw  For stress testing that was ordered in 2017 but apparently not done       Relevant Medications   rosuvastatin (CRESTOR) 20 MG tablet   Dyspnea    Need to rule out occult CAD given extremely high Coronary calcium score,  Family history of CAD.  Referring back to Dr Jens Somrenshaw.        Hyperlipidemia    Changing simvastatin to Crestor for goal LDL < 70 given his high coronary calcium score  Lab Results  Component Value Date   CHOL 176 03/08/2018   HDL 48 03/08/2018   LDLCALC 103 (H) 03/08/2018   LDLDIRECT 144.0 08/06/2012   TRIG 145 03/08/2018   CHOLHDL 3.7 03/08/2018         Relevant Medications   rosuvastatin (CRESTOR) 20 MG tablet   Other Relevant Orders   Lipid panel (Completed)   Hypokalemia due to excessive gastrointestinal loss of potassium    Resolved with oral supplementation given by ER   Lab Results  Component Value Date   NA 140 03/08/2018   K 4.4 03/08/2018   CL 101 03/08/2018   CO2 26 03/08/2018         Insomnia    Resuming trazodone starting at 50 mg dose. Advised to limit alcohol to 2 drinks per night,  At dinner preferably      Multiple thyroid nodules     A total of 40 minutes was spent with patient more than half of which was spent in counseling patient on the above mentioned issues , reviewing and explaining recent ER  labs and imaging , coronary CT done in October , and coordination of care.   I have discontinued Lum KeasWilliam K. [name]'s  simvastatin. I am also having him start on benzonatate and rosuvastatin. Additionally, I am having him maintain his  tiZANidine, allopurinol, tamsulosin, dorzolamide-timolol, colchicine, sildenafil, buPROPion, rOPINIRole, silodosin, pantoprazole, losartan, hydrochlorothiazide, celecoxib, traMADol, ondansetron, potassium chloride SA, metoprolol succinate, and traZODone. We will continue to administer triamcinolone acetonide and lidocaine.  Meds ordered this encounter  Medications  . traZODone (DESYREL) 50 MG tablet    Sig: Take 1 tablet (50 mg total) by mouth daily as needed for sleep.    Dispense:  90 tablet    Refill:  1  . benzonatate (TESSALON) 200 MG capsule    Sig: Take 1 capsule (200 mg total) by mouth 3 (three) times daily as needed for cough.    Dispense:  30 capsule    Refill:  0  . rosuvastatin (CRESTOR) 20 MG tablet    Sig: Take 1 tablet (20 mg total) by mouth daily.    Dispense:  90 tablet    Refill:  1    Medications Discontinued During This Encounter  Medication Reason  . traZODone (DESYREL) 50 MG tablet Reorder  . simvastatin (ZOCOR) 20 MG tablet     Follow-up: No follow-ups on file.   Sherlene Shams, MD

## 2018-03-08 NOTE — Patient Instructions (Addendum)
Mucinex (guafenesin)  May help reduce the   Thick phlegm along with drinking lots of water    I have Refilled trazodone.  Start with 50 mg dose,  Increase to 100 ng if needed after one week  Finish the potassium supplement   I have changed yoru cholesterol medication from simvastatin to rosuvastatin (much more potent) and made the referral t Dr Jens Som

## 2018-03-09 LAB — BASIC METABOLIC PANEL
BUN: 14 mg/dL (ref 7–25)
CHLORIDE: 101 mmol/L (ref 98–110)
CO2: 26 mmol/L (ref 20–32)
Calcium: 9.9 mg/dL (ref 8.6–10.3)
Creat: 0.93 mg/dL (ref 0.70–1.33)
Glucose, Bld: 118 mg/dL — ABNORMAL HIGH (ref 65–99)
POTASSIUM: 4.4 mmol/L (ref 3.5–5.3)
Sodium: 140 mmol/L (ref 135–146)

## 2018-03-09 LAB — LIPID PANEL
Cholesterol: 176 mg/dL (ref <200)
HDL: 48 mg/dL (ref >40)
LDL CHOLESTEROL (CALC): 103 mg/dL — AB
Non-HDL Cholesterol (Calc): 128 mg/dL (calc) (ref <130)
Total CHOL/HDL Ratio: 3.7 (calc) (ref <5.0)
Triglycerides: 145 mg/dL (ref <150)

## 2018-03-10 DIAGNOSIS — N179 Acute kidney failure, unspecified: Secondary | ICD-10-CM | POA: Insufficient documentation

## 2018-03-10 DIAGNOSIS — E876 Hypokalemia: Secondary | ICD-10-CM | POA: Insufficient documentation

## 2018-03-10 NOTE — Assessment & Plan Note (Signed)
Resolved with oral supplementation given by ER   Lab Results  Component Value Date   NA 140 03/08/2018   K 4.4 03/08/2018   CL 101 03/08/2018   CO2 26 03/08/2018

## 2018-03-10 NOTE — Assessment & Plan Note (Signed)
Need to rule out occult CAD given extremely high Coronary calcium score,  Family history of CAD.  Referring back to Dr Jens Som.

## 2018-03-10 NOTE — Assessment & Plan Note (Signed)
Coronary calcium score  of > 400 on Oct 2019 CT.  I am changing his  statin to high potency.  Continue  metoprolol..  Referring back to Dr Sharol Given  For stress testing that was ordered in 2017 but apparently not done

## 2018-03-10 NOTE — Assessment & Plan Note (Signed)
Changing simvastatin to Crestor for goal LDL < 70 given his high coronary calcium score  Lab Results  Component Value Date   CHOL 176 03/08/2018   HDL 48 03/08/2018   LDLCALC 103 (H) 03/08/2018   LDLDIRECT 144.0 08/06/2012   TRIG 145 03/08/2018   CHOLHDL 3.7 03/08/2018

## 2018-03-10 NOTE — Assessment & Plan Note (Addendum)
Resuming trazodone starting at 50 mg dose. Advised to limit alcohol to 2 drinks per night,  At dinner preferably

## 2018-03-10 NOTE — Assessment & Plan Note (Signed)
With severe hypotension and lactic acidosis  Secondary to Influenza B.  Resolved with IV fluids and oral hydration  Lab Results  Component Value Date   CREATININE 0.93 03/08/2018

## 2018-03-13 ENCOUNTER — Telehealth: Payer: Self-pay | Admitting: Internal Medicine

## 2018-03-13 NOTE — Telephone Encounter (Signed)
Copied from CRM [id number]. Topic: General - Other >> Mar 13, 2018  2:28 PM Tamela Oddi wrote: Reason for CRM: Patient called to speak with a nurse or doctor regarding his results of a hand x-ray from 12/2017 for work purposes.  Please advise and call patient back so he can discuss.  CB# [phone]

## 2018-03-14 NOTE — Telephone Encounter (Signed)
Called pt and he stated that all he needed was the results of his hand xray. The results was given to pt. Pt verbalized understanding.

## 2018-04-09 ENCOUNTER — Encounter: Payer: Self-pay | Admitting: Cardiology

## 2018-04-23 NOTE — Progress Notes (Signed)
Saundra Shelling MD Reason for referral-coronary artery disease  HPI: 58 year old male for evaluation of coronary artery disease at request of Duncan Dull MD. Patient seen previously but not since May 2017.  Exercise treadmill June 2017 normal.  Echocardiogram June 2017 showed normal LV function, mild diastolic dysfunction.  Patient had calcium score performed October 2019.  Total score 451.  Patient has dyspnea with more vigorous activities.  No orthopnea, PND, recent pedal edema, exertional chest pain or syncope.  Because of the above we were asked to evaluate.  Current Outpatient Medications  Medication Sig Dispense Refill  . allopurinol (ZYLOPRIM) 300 MG tablet TAKE 1/2 TABLETS (150 MG TOTAL) BY MOUTH DAILY. (Patient taking differently: Take 300 mg by mouth as needed. TAKE 1/2 TABLETS (150 MG TOTAL) BY MOUTH DAILY.) 90 tablet 0  . benzonatate (TESSALON) 200 MG capsule Take 1 capsule (200 mg total) by mouth 3 (three) times daily as needed for cough. 30 capsule 0  . celecoxib (CELEBREX) 200 MG capsule TAKE 1 CAPSULE BY MOUTH EVERY DAY 90 capsule 0  . colchicine 0.6 MG tablet TAKE HOURLY UNTIL GOUT IMPROVED/DIARRHEA, ON FIRST DAY OF TREATMENT.TAKE DAILY THEREAFTER AS NEEDED 30 tablet 3  . dorzolamide-timolol (COSOPT) 22.3-6.8 MG/ML ophthalmic solution PLACE 1 DROP INTO THE RIGHT EYE 2 (TWO) TIMES DAILY.  11  . hydrochlorothiazide (HYDRODIURIL) 25 MG tablet Take 1 tablet (25 mg total) by mouth daily. 90 tablet 3  . losartan (COZAAR) 100 MG tablet Take 1 tablet (100 mg total) by mouth daily. 90 tablet 3  . metoprolol succinate (TOPROL-XL) 25 MG 24 hr tablet TAKE 1 TABLET (25 MG TOTAL) BY MOUTH DAILY. 90 tablet 1  . pantoprazole (PROTONIX) 40 MG tablet TAKE 1 TABLET BY MOUTH TWICE A DAY 180 tablet 1  . rOPINIRole (REQUIP) 0.5 MG tablet Take 1 tablet (0.5 mg total) by mouth at bedtime. (Patient taking differently: Take 0.5 mg by mouth as needed. ) 90 tablet 1  . rosuvastatin (CRESTOR) 20  MG tablet Take 1 tablet (20 mg total) by mouth daily. 90 tablet 1  . sildenafil (REVATIO) 20 MG tablet Take 1 tablet (20 mg total) by mouth 3 (three) times daily. 30 tablet 0  . silodosin (RAPAFLO) 8 MG CAPS capsule TAKE 1 CAPSULE BY MOUTH EVERYDAY AT BEDTIME  11  . traMADol (ULTRAM) 50 MG tablet Take 1 tablet (50 mg total) by mouth every 6 (six) hours as needed for moderate pain. 120 tablet 2  . traZODone (DESYREL) 50 MG tablet Take 1 tablet (50 mg total) by mouth daily as needed for sleep. 90 tablet 1   Current Facility-Administered Medications  Medication Dose Route Frequency Provider Last Rate Last Dose  . lidocaine (XYLOCAINE) 1 % (with pres) injection 4 mL  4 mL Other Once Sherlene Shams, MD        Allergies  Allergen Reactions  . Keflex [Cephalexin] Rash     Past Medical History:  Diagnosis Date  . Arthritis    KNEES  . Cataract    REMOVED BILATERAL  . Claustrophobia   . GERD (gastroesophageal reflux disease)    with certain foods  . Glaucoma (increased eye pressure)   . Headache(784.0)    07/11/13- none in a long time  . Hepatic steatosis   . Hyperglycemia   . Hyperlipidemia   . Hypertension    no meds  . Sleep apnea     Past Surgical History:  Procedure Laterality Date  . CATARACT EXTRACTION W/PHACO Left 07/16/2013  Procedure: CATARACT EXTRACTION PHACO AND INTRAOCULAR LENS PLACEMENT (IOC) LEFT EYE;  Surgeon: Chalmers Guest, MD;  Location: [facility] OR;  Service: Ophthalmology;  Laterality: Left;  . CATARACT EXTRACTION W/PHACO Right 10/29/2013   Procedure: CATARACT EXTRACTION PHACO AND INTRAOCULAR LENS PLACEMENT (IOC);  Surgeon: Chalmers Guest, MD;  Location: [facility] OR;  Service: Ophthalmology;  Laterality: Right;  . EYE SURGERY     2 on left eye  . FINGER SURGERY Right   . MINI SHUNT INSERTION  03/09/2011   Procedure: INSERTION OF MINI SHUNT;  Surgeon: Chalmers Guest, MD;  Location: [facility] OR;  Service: Ophthalmology;  Laterality: Left;    Social History   Socioeconomic History    . Marital status: Divorced    Spouse name: Not on file  . Number of children: 0  . Years of education: college3  . Highest education level: Not on file  Occupational History  . Occupation: Aeronautical engineer: [employer]  Social Needs  . Financial resource strain: Not on file  . Food insecurity:    Worry: Not on file    Inability: Not on file  . Transportation needs:    Medical: Not on file    Non-medical: Not on file  Tobacco Use  . Smoking status: Never Smoker  . Smokeless tobacco: Never Used  Substance and Sexual Activity  . Alcohol use: Yes    Alcohol/week: 2.0 standard drinks    Types: 2 Cans of beer per week    Comment: daily  . Drug use: No  . Sexual activity: Not on file  Lifestyle  . Physical activity:    Days per week: Not on file    Minutes per session: Not on file  . Stress: Not on file  Relationships  . Social connections:    Talks on phone: Not on file    Gets together: Not on file    Attends religious service: Not on file    Active member of club or organization: Not on file    Attends meetings of clubs or organizations: Not on file    Relationship status: Not on file  . Intimate partner violence:    Fear of current or ex partner: Not on file    Emotionally abused: Not on file    Physically abused: Not on file    Forced sexual activity: Not on file  Other Topics Concern  . Not on file  Social History Narrative  . Not on file    Family History  Problem Relation Age of Onset  . Asthma Mother   . Hyperlipidemia Mother   . Heart disease Mother   . Hypertension Mother   . Liver cancer Mother   . Thyroid disease Mother   . Asthma Father   . Hyperlipidemia Father   . Heart disease Father   . Stroke Father   . Hypertension Father   . Aneurysm Father   . Aneurysm Brother        non smoker   . Early death Brother   . Asthma Paternal Aunt   . Heart disease Sister   . Early death Sister   . Migraines Neg Hx     ROS: no fevers or chills,  productive cough, hemoptysis, dysphasia, odynophagia, melena, hematochezia, dysuria, hematuria, rash, seizure activity, orthopnea, PND, pedal edema, claudication. Remaining systems are negative.  Physical Exam:   Blood pressure 128/78, pulse 78, height 5\' 11"  (1.803 m), weight 188 lb (85.3 kg).  General:  Well developed/well nourished in NAD Skin  warm/dry Patient not depressed No peripheral clubbing Back-normal HEENT-normal/normal eyelids Neck supple/normal carotid upstroke bilaterally; no bruits; no JVD; no thyromegaly chest - CTA/ normal expansion CV - RRR/normal S1 and S2; no murmurs, rubs or gallops;  PMI nondisplaced Abdomen -NT/ND, no HSM, no mass, + bowel sounds, no bruit 2+ femoral pulses, no bruits Ext-no edema, chords, 2+ DP Neuro-grossly nonfocal  ECG -03/01/18-sinus rhythm with no ST changes.  Personally reviewed  A/P  1 coronary artery disease-patient has calcium score of 451.  Strong family history of coronary disease.  Also with history of hyperlipidemia and hypertension.  He has some dyspnea on exertion.  I will arrange a stress nuclear study to screen for significant coronary disease.  We will treat with aspirin 81 mg daily and continue statin.  2 hyperlipidemia-given documented coronary disease will increase Crestor to 40 mg daily.  Check lipids and liver in 6 weeks.  Goal LDL less than 70 given coronary disease as outlined above.  3 hypertension-blood pressure is controlled.  Continue present medications and follow.  Olga Millers, MD

## 2018-04-26 ENCOUNTER — Encounter

## 2018-04-26 ENCOUNTER — Ambulatory Visit: Payer: BLUE CROSS/BLUE SHIELD | Admitting: Cardiology

## 2018-04-26 ENCOUNTER — Encounter: Payer: Self-pay | Admitting: Cardiology

## 2018-04-26 VITALS — BP 128/78 | HR 78 | Ht 71.0 in | Wt 188.0 lb

## 2018-04-26 DIAGNOSIS — R931 Abnormal findings on diagnostic imaging of heart and coronary circulation: Secondary | ICD-10-CM | POA: Diagnosis not present

## 2018-04-26 DIAGNOSIS — E7800 Pure hypercholesterolemia, unspecified: Secondary | ICD-10-CM | POA: Diagnosis not present

## 2018-04-26 DIAGNOSIS — I10 Essential (primary) hypertension: Secondary | ICD-10-CM | POA: Diagnosis not present

## 2018-04-26 DIAGNOSIS — R0602 Shortness of breath: Secondary | ICD-10-CM

## 2018-04-26 MED ORDER — ROSUVASTATIN CALCIUM 40 MG PO TABS: 40.0000 mg | ORAL_TABLET | Freq: Every day | ORAL | 3 refills | Status: DC

## 2018-04-26 NOTE — Patient Instructions (Signed)
Medication Instructions:  INCREASE ROSUVASTATIN TO 40 MG ONCE DAILY=2 OF THE 20 MG TABLETS ONCE DAILY If you need a refill on your cardiac medications before your next appointment, please call your pharmacy.   Lab work: Your physician recommends that you return for lab work in: 6 WEEKS PRIOR TO EATING If you have labs (blood work) drawn today and your tests are completely normal, you will receive your results only by: Marland Kitchen MyChart Message (if you have MyChart) OR . A paper copy in the mail If you have any lab test that is abnormal or we need to change your treatment, we will call you to review the results.  Testing/Procedures: Your physician has requested that you have en exercise stress myoview. For further information please visit [email or web address] Please follow instruction sheet, as given.DO NOT TAKE METOPROLOL THE MORNING OF THE STEST    Follow-Up: At [facility], you and your health needs are our priority.  As part of our continuing mission to provide you with exceptional heart care, we have created designated Provider Care Teams.  These Care Teams include your primary Cardiologist (physician) and Advanced Practice Providers (APPs -  Physician Assistants and Nurse Practitioners) who all work together to provide you with the care you need, when you need it. You will need a follow up appointment in 12 months.  Please call our office 2 months in advance to schedule this appointment.  You may see Olga Millers MD or one of the following Advanced Practice Providers on your designated Care Team:   Corine Shelter, PA-C Judy Pimple, [place] . Marjie Skiff, PA-C

## 2018-04-30 ENCOUNTER — Telehealth (HOSPITAL_COMMUNITY): Payer: Self-pay

## 2018-04-30 NOTE — Telephone Encounter (Signed)
Encounter complete. 

## 2018-05-03 ENCOUNTER — Other Ambulatory Visit: Payer: Self-pay

## 2018-05-03 ENCOUNTER — Ambulatory Visit (HOSPITAL_COMMUNITY)
Admission: RE | Admit: 2018-05-03 | Discharge: 2018-05-03 | Disposition: A | Discharge status: Home / Self Care | Payer: BLUE CROSS/BLUE SHIELD | Source: Ambulatory Visit | Attending: Cardiology | Admitting: Cardiology

## 2018-05-03 DIAGNOSIS — R0602 Shortness of breath: Secondary | ICD-10-CM | POA: Diagnosis not present

## 2018-05-03 DIAGNOSIS — R931 Abnormal findings on diagnostic imaging of heart and coronary circulation: Secondary | ICD-10-CM | POA: Insufficient documentation

## 2018-05-03 LAB — MYOCARDIAL PERFUSION IMAGING
CHL CUP NUCLEAR SRS: 5
CHL CUP RESTING HR STRESS: 76 {beats}/min
CSEPHR: 103 %
Estimated workload: 11.7 METS
Exercise duration (min): 10 min
Exercise duration (sec): 0 s
LV sys vol: 41 mL
LVDIAVOL: 103 mL (ref 62–150)
MPHR: 164 {beats}/min
Peak HR: 169 {beats}/min
RPE: 17
SDS: 7
SSS: 12
TID: 0.96

## 2018-05-03 MED ORDER — TECHNETIUM TC 99M TETROFOSMIN IV KIT
30.7000 | PACK | Freq: Once | INTRAVENOUS | Status: AC | PRN
  Administered 2018-05-03: 30.7 via INTRAVENOUS
  Filled 2018-05-03: qty 31

## 2018-05-03 MED ORDER — TECHNETIUM TC 99M TETROFOSMIN IV KIT
10.0000 | PACK | Freq: Once | INTRAVENOUS | Status: AC | PRN
  Administered 2018-05-03: 10 via INTRAVENOUS
  Filled 2018-05-03: qty 10

## 2018-06-06 ENCOUNTER — Telehealth: Payer: Self-pay | Admitting: Internal Medicine

## 2018-06-06 NOTE — Telephone Encounter (Signed)
Copied from CRM [id number]. Topic: Quick Communication - Rx Refill/Question >> Jun 06, 2018 12:49 PM Floria Raveling A wrote: Medication:  colchicine 0.6 MG tablet [[id number]]- pt is currently having a flare up.  He is completley out of this med   Has the patient contacted their pharmacy? No.- Script has expired  (Agent: If no, request that the patient contact the pharmacy for the refill.) (Agent: If yes, when and what did the pharmacy advise?)  Preferred Pharmacy (with phone number or street name): [id number] - [place], [place] - [address] AT [facility] [phone] (Phone)   Agent: Please be advised that RX refills may take up to 3 business days. We ask that you follow-up with your pharmacy.

## 2018-06-07 ENCOUNTER — Other Ambulatory Visit: Payer: Self-pay | Admitting: Internal Medicine

## 2018-06-07 MED ORDER — COLCHICINE 0.6 MG PO TABS: ORAL_TABLET | ORAL | 3 refills | Status: DC

## 2018-06-07 NOTE — Telephone Encounter (Signed)
Refilled colchicine for pt due to his current gout flare up.

## 2018-06-21 ENCOUNTER — Other Ambulatory Visit: Payer: Self-pay

## 2018-06-21 ENCOUNTER — Encounter: Payer: Self-pay | Admitting: Internal Medicine

## 2018-06-21 ENCOUNTER — Ambulatory Visit (INDEPENDENT_AMBULATORY_CARE_PROVIDER_SITE_OTHER): Payer: BLUE CROSS/BLUE SHIELD | Admitting: Internal Medicine

## 2018-06-21 DIAGNOSIS — I2510 Atherosclerotic heart disease of native coronary artery without angina pectoris: Secondary | ICD-10-CM | POA: Diagnosis not present

## 2018-06-21 DIAGNOSIS — Z79899 Other long term (current) drug therapy: Secondary | ICD-10-CM

## 2018-06-21 DIAGNOSIS — H409 Unspecified glaucoma: Secondary | ICD-10-CM

## 2018-06-21 DIAGNOSIS — E7800 Pure hypercholesterolemia, unspecified: Secondary | ICD-10-CM | POA: Diagnosis not present

## 2018-06-21 DIAGNOSIS — I10 Essential (primary) hypertension: Secondary | ICD-10-CM

## 2018-06-21 DIAGNOSIS — I2584 Coronary atherosclerosis due to calcified coronary lesion: Secondary | ICD-10-CM

## 2018-06-21 DIAGNOSIS — Z7189 Other specified counseling: Secondary | ICD-10-CM

## 2018-06-21 MED ORDER — TRAMADOL HCL 50 MG PO TABS: 50.0000 mg | ORAL_TABLET | Freq: Four times a day (QID) | ORAL | 2 refills | Status: DC | PRN

## 2018-06-21 NOTE — Progress Notes (Signed)
Virtual Visit converted to Telephone  Note  This visit type was conducted due to national recommendations for restrictions regarding the COVID-19 pandemic (e.g. social distancing).  This format is felt to be most appropriate for this patient at this time.  All issues noted in this document were discussed and addressed.  No physical exam was performed (except for noted visual exam findings with Video Visits).   I attempted to connect  with@ on 06/21/18 at  1:30 PM EDT by a video enabled telemedicine application and verified that I am speaking with the correct person using two identifiers.  Interactive audio and video telecommunications were attempted between this provider and patient, however failed, due to patient having technical difficulties .  We continued and completed visit with audio only.  Location patient: home Location provider: work or home office Persons participating in the virtual visit: patient, provider  I discussed the limitations, risks, security and privacy concerns of performing an evaluation and management service by telephone and the availability of in person appointments. I also discussed with the patient that there may be a patient responsible charge related to this service. The patient expressed understanding and agreed to proceed. Reason for visit: follow up on multiple chronic issues  HPI:  Referred to cardiology in January for hi risk of CAD .based on Cardiac CT  Score  of 451 in 2019  And strong FH of CAD .  Jeff [name] was evaluated by Olga Millers,  Had a Myoview done March 13. Low risk study. crestor dose was increased to 40 mg and asa 81 mg advised.  Goal LDL < 70   Has not had repeat lipids or hepatic panel done yet.   Shoulder pain:  Chronic,  Had improved with PT done regularly at gym. HOwever since the COVID EPIDEMIC  closed the gyms Jeff [name] has  Not been exercising the same  And feels Jeff [name] has lost some strength since then and notes that the shoulder is  hurting a little  more.  Scapular pain is burning and tingling  And biceps head hurts .  Glaucoma:   Jeff [name] has had increased pain in the right eye and has increased his use of tramadol periodically to 4 daily. Jeff [name] has not requested any early refills    Jeff [name] continues to work for [employer]:  Goes to work daily  Working longer hours.   The patient has no signs or symptoms of COVID 19 infection (fever, cough, sore throat  or shortness of breath beyond what is typical for patient).  Patient denies contact with other persons with the above mentioned symptoms or with anyone confirmed to have COVID 19 .   Patient is taking his medications as prescribed and notes no adverse effects.  Home BP readings have been done about once per week and are  generally < 130/80 .  Jeff [name] is avoiding added salt in her diet and walking regularly about 3 times per week for exercise  .  ROS: See pertinent positives and negatives per HPI.  Past Medical History:  Diagnosis Date   Arthritis    KNEES   Cataract    REMOVED BILATERAL   Claustrophobia    GERD (gastroesophageal reflux disease)    with certain foods   Glaucoma (increased eye pressure)    Headache(784.0)    07/11/13- none in a long time   Hepatic steatosis    Hyperglycemia    Hyperlipidemia    Hypertension    no meds   Sleep apnea  Past Surgical History:  Procedure Laterality Date   CATARACT EXTRACTION W/PHACO Left 07/16/2013   Procedure: CATARACT EXTRACTION PHACO AND INTRAOCULAR LENS PLACEMENT (IOC) LEFT EYE;  Surgeon: Chalmers Guestoy Whitaker, MD;  [facility];  Service: Ophthalmology;  Laterality: Left;   CATARACT EXTRACTION W/PHACO Right 10/29/2013   Procedure: CATARACT EXTRACTION PHACO AND INTRAOCULAR LENS PLACEMENT (IOC);  Surgeon: Chalmers Guestoy Whitaker, MD;  [facility];  Service: Ophthalmology;  Laterality: Right;   EYE SURGERY     2 on left eye   FINGER SURGERY Right    MINI SHUNT INSERTION  03/09/2011   Procedure: INSERTION OF MINI SHUNT;  Surgeon: Chalmers Guestoy Whitaker, MD;   [facility];  Service: Ophthalmology;  Laterality: Left;    Family History  Problem Relation Age of Onset   Asthma Mother    Hyperlipidemia Mother    Heart disease Mother    Hypertension Mother    Liver cancer Mother    Thyroid disease Mother    Asthma Father    Hyperlipidemia Father    Heart disease Father    Stroke Father    Hypertension Father    Aneurysm Father    Aneurysm Brother        non smoker    Early death Brother    Asthma Paternal Aunt    Heart disease Sister    Early death Sister    Migraines Neg Hx     SOCIAL HX: divorced,  Employed by [employer] .  uses alchol daily    Current Outpatient Medications:    allopurinol (ZYLOPRIM) 300 MG tablet, TAKE 1/2 TABLETS (150 MG TOTAL) BY MOUTH DAILY. (Patient taking differently: Take 300 mg by mouth as needed. TAKE 1/2 TABLETS (150 MG TOTAL) BY MOUTH DAILY.), Disp: 90 tablet, Rfl: 0   colchicine 0.6 MG tablet, TAKE HOURLY UNTIL GOUT IMPROVED/DIARRHEA, ON FIRST DAY OF TREATMENT.TAKE DAILY THEREAFTER AS NEEDED, Disp: 30 tablet, Rfl: 3   dorzolamide-timolol (COSOPT) 22.3-6.8 MG/ML ophthalmic solution, PLACE 1 DROP INTO THE RIGHT EYE 2 (TWO) TIMES DAILY., Disp: , Rfl: 11   hydrochlorothiazide (HYDRODIURIL) 25 MG tablet, Take 1 tablet (25 mg total) by mouth daily., Disp: 90 tablet, Rfl: 3   losartan (COZAAR) 100 MG tablet, Take 1 tablet (100 mg total) by mouth daily., Disp: 90 tablet, Rfl: 3   metoprolol succinate (TOPROL-XL) 25 MG 24 hr tablet, TAKE 1 TABLET (25 MG TOTAL) BY MOUTH DAILY., Disp: 90 tablet, Rfl: 1   pantoprazole (PROTONIX) 40 MG tablet, TAKE 1 TABLET BY MOUTH TWICE A DAY, Disp: 180 tablet, Rfl: 1   rOPINIRole (REQUIP) 0.5 MG tablet, Take 1 tablet (0.5 mg total) by mouth at bedtime. (Patient taking differently: Take 0.5 mg by mouth as needed. ), Disp: 90 tablet, Rfl: 1   rosuvastatin (CRESTOR) 40 MG tablet, Take 1 tablet (40 mg total) by mouth daily., Disp: 90 tablet, Rfl: 3   sildenafil  (REVATIO) 20 MG tablet, Take 1 tablet (20 mg total) by mouth 3 (three) times daily., Disp: 30 tablet, Rfl: 0   silodosin (RAPAFLO) 8 MG CAPS capsule, TAKE 1 CAPSULE BY MOUTH EVERYDAY AT BEDTIME, Disp: , Rfl: 11   traMADol (ULTRAM) 50 MG tablet, Take 1 tablet (50 mg total) by mouth every 6 (six) hours as needed for moderate pain., Disp: 120 tablet, Rfl: 2   traZODone (DESYREL) 50 MG tablet, Take 1 tablet (50 mg total) by mouth daily as needed for sleep., Disp: 90 tablet, Rfl: 1   benzonatate (TESSALON) 200 MG capsule, Take 1  capsule (200 mg total) by mouth 3 (three) times daily as needed for cough. (Patient not taking: Reported on 06/21/2018), Disp: 30 capsule, Rfl: 0   celecoxib (CELEBREX) 200 MG capsule, TAKE 1 CAPSULE BY MOUTH EVERY DAY (Patient not taking: Reported on 06/21/2018), Disp: 90 capsule, Rfl: 0   prednisoLONE acetate (PRED FORTE) 1 % ophthalmic suspension, INSTILL 1 DROP IN BOTH EYES 4 TIMES DAILY, Disp: , Rfl:   Current Facility-Administered Medications:    lidocaine (XYLOCAINE) 1 % (with pres) injection 4 mL, 4 mL, Other, Once, Darrick Huntsman, Mar Daring, MD  EXAM:  VITALS per patient if applicable:  GENERAL: alert, oriented, appears well and in no acute distress  HEENT: atraumatic, conjunttiva clear, no obvious abnormalities on inspection of external nose and ears  NECK: normal movements of the head and neck  LUNGS: on inspection no signs of respiratory distress, breathing rate appears normal, no obvious gross SOB, gasping or wheezing  CV: no obvious cyanosis  MS: moves all visible extremities without noticeable abnormality  PSYCH/NEURO: pleasant and cooperative, no obvious depression or anxiety, speech and thought processing grossly intact  ASSESSMENT AND PLAN:  Discussed the following assessment and plan:  Long-term use of high-risk medication - Plan: Basic metabolic panel  Glaucoma of right eye, unspecified glaucoma type  Pure hypercholesterolemia  Coronary artery  disease due to calcified coronary lesion  Essential hypertension  Educated About Covid-19 Virus Infection  Glaucoma (increased eye pressure) Advised to follow up with his ophthalmologist given Jeff [name] increase pain in his right eye which may indicate increased pressure   Hyperlipidemia Repeat lipids are due post increased dose of Crestor for goal LDL < 70 given his high coronary calcium score  Coronary artery disease due to calcified coronary lesion Despite his high coronary calcium score,  Jeff [name] had a "low risk " myoview.  Jeff [name] is taking aspirin,  High dose crestor , beta blocker and ARB   HTN (hypertension) Well controlled on current regimen. Renal function stable, no changes today.  Lab Results  Component Value Date   CREATININE 0.93 03/08/2018   Lab Results  Component Value Date   NA 140 03/08/2018   K 4.4 03/08/2018   CL 101 03/08/2018   CO2 26 03/08/2018     Educated About Covid-19 Virus Infection Educated patient on the signs and symptoms of COVID-19 infection and ways to avoid the viral infection including washing hands frequently with soap and water,  using hand sanitizer if unable to wash, avoiding touching face,  staying at home and limiting visitors,  and avoiding contact with people coming in and out of home.  Reminded patient to call office with questions/concerns.  The importance of social distancing was discussed today    I discussed the assessment and treatment plan with the patient. The patient was provided an opportunity to ask questions and all were answered. The patient agreed with the plan and demonstrated an understanding of the instructions.   The patient was advised to call back or seek an in-person evaluation if the symptoms worsen or if the condition fails to improve as anticipated.  I provided 25 minutes of non-face-to-face time during this encounter.   Sherlene Shams, MD

## 2018-06-23 DIAGNOSIS — Z7189 Other specified counseling: Secondary | ICD-10-CM | POA: Insufficient documentation

## 2018-06-23 NOTE — Assessment & Plan Note (Signed)
Advised to follow up with his ophthalmologist given he increase pain in his right eye which may indicate increased pressure

## 2018-06-23 NOTE — Assessment & Plan Note (Signed)
Educated patient on the signs and symptoms of COVID-19 infection and ways to avoid the viral infection including washing hands frequently with soap and water,  using hand sanitizer if unable to wash, avoiding touching face,  staying at home and limiting visitors,  and avoiding contact with people coming in and out of home.  Reminded patient to call office with questions/concerns.  The importance of social distancing was discussed today 

## 2018-06-23 NOTE — Assessment & Plan Note (Signed)
Well controlled on current regimen. Renal function stable, no changes today.  Lab Results  Component Value Date   CREATININE 0.93 03/08/2018   Lab Results  Component Value Date   NA 140 03/08/2018   K 4.4 03/08/2018   CL 101 03/08/2018   CO2 26 03/08/2018

## 2018-06-23 NOTE — Assessment & Plan Note (Signed)
Repeat lipids are due post increased dose of Crestor for goal LDL < 70 given his high coronary calcium score

## 2018-06-23 NOTE — Assessment & Plan Note (Signed)
Despite his high coronary calcium score,  He had a "low risk " myoview.  He is taking aspirin,  High dose crestor , beta blocker and ARB

## 2018-08-26 ENCOUNTER — Other Ambulatory Visit: Payer: Self-pay | Admitting: Internal Medicine

## 2018-08-30 ENCOUNTER — Other Ambulatory Visit: Payer: Self-pay | Admitting: Internal Medicine

## 2018-08-31 ENCOUNTER — Other Ambulatory Visit: Payer: Self-pay | Admitting: Internal Medicine

## 2018-11-23 ENCOUNTER — Other Ambulatory Visit: Payer: Self-pay | Admitting: Internal Medicine

## 2018-11-25 NOTE — Telephone Encounter (Signed)
Refill for 30 days only.  OFFICE VISIT NEEDED prior to any more refills 

## 2018-11-25 NOTE — Telephone Encounter (Signed)
Refilled: 06/21/2018 Last OV: 06/21/2018 Next OV: not scheduled

## 2018-11-28 ENCOUNTER — Other Ambulatory Visit: Payer: Self-pay | Admitting: Internal Medicine

## 2018-11-29 ENCOUNTER — Other Ambulatory Visit: Payer: Self-pay

## 2018-11-29 ENCOUNTER — Encounter: Payer: Self-pay | Admitting: Internal Medicine

## 2018-11-29 ENCOUNTER — Ambulatory Visit (INDEPENDENT_AMBULATORY_CARE_PROVIDER_SITE_OTHER): Payer: BC Managed Care – PPO | Admitting: Internal Medicine

## 2018-11-29 VITALS — Ht 71.0 in | Wt 188.0 lb

## 2018-11-29 DIAGNOSIS — G8929 Other chronic pain: Secondary | ICD-10-CM

## 2018-11-29 DIAGNOSIS — J310 Chronic rhinitis: Secondary | ICD-10-CM

## 2018-11-29 DIAGNOSIS — F321 Major depressive disorder, single episode, moderate: Secondary | ICD-10-CM

## 2018-11-29 DIAGNOSIS — H5711 Ocular pain, right eye: Secondary | ICD-10-CM

## 2018-11-29 DIAGNOSIS — E119 Type 2 diabetes mellitus without complications: Secondary | ICD-10-CM

## 2018-11-29 DIAGNOSIS — M25512 Pain in left shoulder: Secondary | ICD-10-CM | POA: Diagnosis not present

## 2018-11-29 DIAGNOSIS — K760 Fatty (change of) liver, not elsewhere classified: Secondary | ICD-10-CM
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
## 2018-12-01 DIAGNOSIS — M25512 Pain in left shoulder: Secondary | ICD-10-CM | POA: Insufficient documentation

## 2018-12-01 DIAGNOSIS — E119 Type 2 diabetes mellitus without complications: Secondary | ICD-10-CM | POA: Insufficient documentation

## 2018-12-01 DIAGNOSIS — G8929 Other chronic pain: Secondary | ICD-10-CM | POA: Insufficient documentation

## 2018-12-01 DIAGNOSIS — R7303 Prediabetes: Secondary | ICD-10-CM | POA: Insufficient documentation

## 2018-12-01 NOTE — Assessment & Plan Note (Signed)
His  fasting  Glucose was elevated and  diagnostic of diabetes in Sept 2018 ,  But he  lowered his A1 to 5.6 with  healthy  lifestyle changes including Mediterranean diet , weight loss  and regular  exercise . He is overdue for follow up Lab Results  Component Value Date   HGBA1C 5.6 12/28/2017

## 2018-12-01 NOTE — Assessment & Plan Note (Signed)
Presumed by ultrasound changes and serologies negative for autoimmune causes of hepatitis.  Current liver enzymes are overdue,  and all modifiable risk factors including obesity, alcohol abuse and hyperlipidemia have been addressed   Lab Results  Component Value Date   ALT 41 03/01/2018   AST 56 (H) 03/01/2018   ALKPHOS 51 03/01/2018   BILITOT 0.8 03/01/2018

## 2018-12-01 NOTE — Assessment & Plan Note (Signed)
Referral to [facility] recommended and accepted

## 2018-12-01 NOTE — Assessment & Plan Note (Addendum)
Resolved.  His symptoms were largely reactive secondary to marriage dissolution and loss of vision in left eye but were complicated by alcohol abuse.  He has been avoiding alcohol and taking only trazodone for chronic insomnia with no relapse.

## 2018-12-18 ENCOUNTER — Other Ambulatory Visit: Payer: Self-pay | Admitting: Internal Medicine

## 2018-12-23 ENCOUNTER — Telehealth: Payer: Self-pay | Admitting: *Deleted

## 2018-12-23 NOTE — Telephone Encounter (Signed)
Given his history of glaucoma, I would prefer that he ask his ophthalmologist what products he can use because many of them are not advised.

## 2018-12-23 NOTE — Telephone Encounter (Signed)
Copied from [place] [phone]. Topic: General - Inquiry >> Dec 23, 2018 11:40 AM Virl Axe D wrote: Reason for CRM: Pt stated he is unable to get in with ENT until 01/13/19. He is wondering if Dr. Derrel Nip can suggest anything to help with sinus/eye pressure until then. Okay to leave detailed vm.

## 2018-12-26 MED ORDER — PREDNISONE 10 MG PO TABS: ORAL_TABLET | ORAL | 0 refills | Status: DC

## 2018-12-26 NOTE — Telephone Encounter (Signed)
He should use the afrin every 12 hours,  I will send in a prednisone taper and keep the appt on Monday   6 tablets DAILY FOR 3 DAYS,,  ,  Then taper by 1 tablet daily until gone

## 2018-12-26 NOTE — Telephone Encounter (Signed)
Pt states his ophthalmologist advised him to ask his pcp .  He only suggest afrin. Nose runs constantly, the pressure in his head is tremendous. He does not know what to do now.  Pt has been scheduled virtual visit Monday at 4:30 to discuss his issues.

## 2018-12-27 NOTE — Telephone Encounter (Signed)
Patient notified and voiced understanding.

## 2018-12-30 ENCOUNTER — Telehealth (INDEPENDENT_AMBULATORY_CARE_PROVIDER_SITE_OTHER): Payer: BC Managed Care – PPO | Admitting: Internal Medicine

## 2018-12-30 ENCOUNTER — Other Ambulatory Visit: Payer: Self-pay

## 2018-12-30 DIAGNOSIS — J3489 Other specified disorders of nose and nasal sinuses: Secondary | ICD-10-CM

## 2018-12-30 NOTE — Progress Notes (Signed)
Virtual Visit via telephone  Note  This visit type was conducted due to national recommendations for restrictions regarding the COVID-19 pandemic (e.g. social distancing).  This format is felt to be most appropriate for this patient at this time.  All issues noted in this document were discussed and addressed.  No physical exam was performed (except for noted visual exam findings with Video Visits).   I connected with@ on 12/30/18 at  4:30 PM EST by telephone and verified that I am speaking with the correct person using two identifiers. Location patient: home Location provider: work or home office Persons participating in the virtual visit: patient, provider  I discussed the limitations, risks, security and privacy concerns of performing an evaluation and management service by telephone and the availability of in person appointments. I also discussed with the patient that there may be a patient responsible charge related to this service. The patient expressed understanding and agreed to proceed.   Reason for visit: sinus pain   HPI:  58 yr old male with history of hypertension, glaucoma with loss of vision presents for management of sinus pain and ear pain that has been preent for one week.  Symptoms have occurred without fevers,  Purulent discharge or loss of hearing.  Patient was started on a prednisone taper 48 hours ago and notes improved symptoms . Denies cough,  Purulent nasal discharge  Dizziness and malaise.    ROS: See pertinent positives and negatives per HPI.  Past Medical History:  Diagnosis Date  . Arthritis    KNEES  . Cataract    REMOVED BILATERAL  . Claustrophobia   . GERD (gastroesophageal reflux disease)    with certain foods  . Glaucoma (increased eye pressure)   . Headache(784.0)    07/11/13- none in a long time  . Hepatic steatosis   . Hyperglycemia   . Hyperlipidemia   . Hypertension    no meds  . Sleep apnea     Past Surgical History:  Procedure  Laterality Date  . CATARACT EXTRACTION W/PHACO Left 07/16/2013   Procedure: CATARACT EXTRACTION PHACO AND INTRAOCULAR LENS PLACEMENT (IOC) LEFT EYE;  Surgeon: Marylynn Pearson, MD;  Location: [facility];  Service: Ophthalmology;  Laterality: Left;  . CATARACT EXTRACTION W/PHACO Right 10/29/2013   Procedure: CATARACT EXTRACTION PHACO AND INTRAOCULAR LENS PLACEMENT (IOC);  Surgeon: Marylynn Pearson, MD;  Location: [facility];  Service: Ophthalmology;  Laterality: Right;  . EYE SURGERY     2 on left eye  . FINGER SURGERY Right   . MINI SHUNT INSERTION  03/09/2011   Procedure: INSERTION OF MINI SHUNT;  Surgeon: Marylynn Pearson, MD;  Location: [place];  Service: Ophthalmology;  Laterality: Left;    Family History  Problem Relation Age of Onset  . Asthma Mother   . Hyperlipidemia Mother   . Heart disease Mother   . Hypertension Mother   . Liver cancer Mother   . Thyroid disease Mother   . Asthma Father   . Hyperlipidemia Father   . Heart disease Father   . Stroke Father   . Hypertension Father   . Aneurysm Father   . Aneurysm Brother        non smoker   . Early death Brother   . Asthma Paternal Aunt   . Heart disease Sister   . Early death Sister   . Migraines Neg Hx     SOCIAL HX:  reports that he has never smoked. He has never used smokeless tobacco. He reports  current alcohol use of about 2.0 standard drinks of alcohol per week. He reports that he does not use drugs.   Current Outpatient Medications:  .  allopurinol (ZYLOPRIM) 300 MG tablet, TAKE 1/2 TABLETS (150 MG TOTAL) BY MOUTH DAILY. (Patient taking differently: Take 300 mg by mouth as needed. TAKE 1/2 TABLETS (150 MG TOTAL) BY MOUTH DAILY.), Disp: 90 tablet, Rfl: 0 .  colchicine 0.6 MG tablet, TAKE HOURLY UNTIL GOUT IMPROVED/DIARRHEA, ON FIRST DAY OF TREATMENT.TAKE DAILY THEREAFTER AS NEEDED, Disp: 30 tablet, Rfl: 3 .  dorzolamide-timolol (COSOPT) 22.3-6.8 MG/ML ophthalmic solution, PLACE 1 DROP INTO THE RIGHT EYE 2 (TWO) TIMES DAILY., Disp: , Rfl:  11 .  hydrochlorothiazide (HYDRODIURIL) 25 MG tablet, TAKE 1 TABLET BY MOUTH EVERY DAY, Disp: 90 tablet, Rfl: 3 .  losartan (COZAAR) 100 MG tablet, TAKE 1 TABLET BY MOUTH EVERY DAY, Disp: 90 tablet, Rfl: 3 .  metoprolol succinate (TOPROL-XL) 25 MG 24 hr tablet, TAKE 1 TABLET BY MOUTH EVERY DAY, Disp: 90 tablet, Rfl: 1 .  pantoprazole (PROTONIX) 40 MG tablet, TAKE 1 TABLET BY MOUTH TWICE A DAY, Disp: 180 tablet, Rfl: 1 .  prednisoLONE acetate (PRED FORTE) 1 % ophthalmic suspension, INSTILL 1 DROP IN BOTH EYES 4 TIMES DAILY, Disp: , Rfl:  .  predniSONE (DELTASONE) 10 MG tablet, 6 tablets daily for 3 days,  then reduce by 1 tablet daily until gone, Disp: 33 tablet, Rfl: 0 .  rOPINIRole (REQUIP) 0.5 MG tablet, Take 1 tablet (0.5 mg total) by mouth at bedtime. (Patient taking differently: Take 0.5 mg by mouth as needed. ), Disp: 90 tablet, Rfl: 1 .  rosuvastatin (CRESTOR) 40 MG tablet, Take 1 tablet (40 mg total) by mouth daily., Disp: 90 tablet, Rfl: 3 .  sildenafil (REVATIO) 20 MG tablet, Take 1 tablet (20 mg total) by mouth 3 (three) times daily., Disp: 30 tablet, Rfl: 0 .  silodosin (RAPAFLO) 8 MG CAPS capsule, TAKE 1 CAPSULE BY MOUTH EVERYDAY AT BEDTIME, Disp: , Rfl: 11 .  traMADol (ULTRAM) 50 MG tablet, TAKE 1 TABLET (50 MG TOTAL) BY MOUTH EVERY 6 (SIX) HOURS AS NEEDED FOR MODERATE PAIN, Disp: 120 tablet, Rfl: 0 .  traZODone (DESYREL) 50 MG tablet, TAKE 1 TABLET BY MOUTH EVERY DAY AS NEEDED FOR SLEEP, Disp: 90 tablet, Rfl: 1 .  XIIDRA 5 % SOLN, , Disp: , Rfl:   Current Facility-Administered Medications:  .  lidocaine (XYLOCAINE) 1 % (with pres) injection 4 mL, 4 mL, Other, Once, Darrick Huntsmanullo, Mar Daringeresa L, MD  EXAM:  General impression: alert, cooperative and articulate.  No signs of being in distress  Lungs: speech is fluent sentence length suggests that patient is not short of breath and not punctuated by cough, sneezing or sniffing. Marland Kitchen.   Psych: affect normal.  speech is articulate and non pressured .   Denies suicidal thoughts  ASSESSMENT AND PLAN:  Discussed the following assessment and plan:  Sinus pain  Sinus pain Symptoms of acute pain and pressure have Improved with prednisone taper  Advised to add Afrin for congestion and anthistamine without decongestant.     I discussed the assessment and treatment plan with the patient. The patient was provided an opportunity to ask questions and all were answered. The patient agreed with the plan and demonstrated an understanding of the instructions.   The patient was advised to call back or seek an in-person evaluation if the symptoms worsen or if the condition fails to improve as anticipated.  I provided 15 minutes of  non-face-to-face time during this encounter reviewing patient's current problems and post surgeries.  Providing counseling on the above mentioned problems , and coordination  of care .   Sherlene Shams, MD

## 2018-12-31 DIAGNOSIS — J3489 Other specified disorders of nose and nasal sinuses: Secondary | ICD-10-CM | POA: Insufficient documentation

## 2018-12-31 NOTE — Assessment & Plan Note (Addendum)
Symptoms of acute pain and pressure have Improved with prednisone taper  Advised to add Afrin for congestion and anthistamine without decongestant.

## 2019-01-04 ENCOUNTER — Other Ambulatory Visit: Payer: Self-pay | Admitting: Internal Medicine

## 2019-01-06 NOTE — Telephone Encounter (Signed)
Refilled: 11/25/2018 Last OV: 11/29/2018 Next OV: not scheduled

## 2019-01-13 DIAGNOSIS — J324 Chronic pansinusitis: Secondary | ICD-10-CM | POA: Insufficient documentation

## 2019-02-18 ENCOUNTER — Other Ambulatory Visit: Payer: Self-pay | Admitting: Internal Medicine

## 2019-02-18 NOTE — Telephone Encounter (Signed)
Refilled: 01/06/2019 Last OV: 11/29/2018 Next OV: not scheduled

## 2019-02-18 NOTE — Telephone Encounter (Signed)
Pt called about the request that was sent in for tramadol. Please advise and Thank you!

## 2019-02-25 ENCOUNTER — Other Ambulatory Visit: Payer: Self-pay | Admitting: Internal Medicine

## 2019-02-28 ENCOUNTER — Other Ambulatory Visit: Payer: Self-pay | Admitting: Internal Medicine

## 2019-04-11 ENCOUNTER — Other Ambulatory Visit: Payer: Self-pay | Admitting: Internal Medicine

## 2019-04-11 NOTE — Telephone Encounter (Signed)
Refilled: 02/18/2019 Last OV: 11/29/2018 Next OV: not scheduled

## 2019-04-30 ENCOUNTER — Other Ambulatory Visit: Payer: Self-pay | Admitting: Internal Medicine

## 2019-05-20 ENCOUNTER — Other Ambulatory Visit: Payer: Self-pay | Admitting: Cardiology

## 2019-05-20 DIAGNOSIS — E7800 Pure hypercholesterolemia, unspecified: Secondary | ICD-10-CM

## 2019-05-26 ENCOUNTER — Other Ambulatory Visit: Payer: Self-pay | Admitting: Internal Medicine

## 2019-06-22 ENCOUNTER — Other Ambulatory Visit: Payer: Self-pay | Admitting: Internal Medicine

## 2019-07-05 ENCOUNTER — Other Ambulatory Visit: Payer: Self-pay | Admitting: Cardiology

## 2019-07-05 DIAGNOSIS — E7800 Pure hypercholesterolemia, unspecified: Secondary | ICD-10-CM

## 2019-07-08 ENCOUNTER — Encounter: Payer: Self-pay | Admitting: General Practice

## 2019-07-10 ENCOUNTER — Other Ambulatory Visit: Payer: Self-pay | Admitting: Cardiology

## 2019-07-10 DIAGNOSIS — E7800 Pure hypercholesterolemia, unspecified: Secondary | ICD-10-CM

## 2019-07-10 MED ORDER — ROSUVASTATIN CALCIUM 40 MG PO TABS: 40.0000 mg | ORAL_TABLET | Freq: Every day | ORAL | 0 refills | Status: DC

## 2019-07-10 NOTE — Telephone Encounter (Signed)
*  STAT* If patient is at the pharmacy, call can be transferred to refill team.   1. Which medications need to be refilled? (please list name of each medication and dose if known)  rosuvastatin (CRESTOR) 40 MG tablet  2. Which pharmacy/location (including street and city if local pharmacy) is medication to be sent to?   [id number] - [place], [place] - [address] [facility]  3. Do they need a 30 day or 90 day supply? 90 with Refills  Pt was not sure if DR. Crenshaw would refill this now or if he should wait until after his visit with the PA 07-23-19. The pt plans to have labs done at his appt

## 2019-07-16 NOTE — Progress Notes (Signed)
Cardiology Office Note:    Date:  07/23/2019   ID:  Jeff [name], DOB Mar 29, 1960, MRN [id number]  PCP:  Crecencio Mc, MD  Cardiologist:  Kirk Ruths, MD  Electrophysiologist:  None   Referring MD: Crecencio Mc, MD   Chief Complaint: follow-up for elevated coronary calcium score  History of Present Illness:    Jeff [name] is a 59 y.o. male with a history of elevated coronary calcium score but normal Myoview in 04/2018, hypertension, hyperlipidemia, GERD who is followed by Dr. Stanford Breed and presents today for routine follow-up.  Patient was first seen by Dr. Stanford Breed in 06/2015 for evaluation of possible CHF. He elevated LFTs and was told that one possible cause could be CHF. Therefore, he was referred to Cardiology. At visit with Dr. Stanford Breed, he reported dyspnea with more extreme activities but not with routine activities. No other cardiac symptoms.  Echo was ordered for further evaluation and showed LVEF of 60-65% with normal wall motion and grade 1 diastolic dysfunction. Given strong family history of CAD, ETT was also ordered and was normal at an excellent work load. Patient was not seen again until 04/3028 for further evaluation of elevated coronary calcium score. He had a coronary calcium score  of 451 (95th percentile for age and sex) in 11/2017. He continued to note some dyspnea with vigorous activity but no other cardiac history. Dr. Stanford Breed ordered an Exercise Myoview for further evaluation and this came back low risk with no evidence of ischemia or prior infarct. He was advised to follow-up in 1 year.   Patient presents today for follow-up. Patient doing well since last visit. Patient works at [employer]. He notes occasional brief episodes of chest discomfort if he has lifted something heavy that last for about 30 seconds and then resolves with stretching. He describes it as a tightness/pressure that almost feels like reflux. No associated symptoms or radiation to neck/arm. No  other chest pain. He has started going back to the gym and has not chest pain with this. No shortness of breath. No orthopnea or PND. He notes occasional mild ankle edema at the end of the day that resolves by the morning. No palpitations. He notes occasional mild lightheadedness with quick position changes but states it never lasts long. No falls or syncope.  Past Medical History:  Diagnosis Date  . Arthritis    KNEES  . Cataract    REMOVED BILATERAL  . Claustrophobia   . GERD (gastroesophageal reflux disease)    with certain foods  . Glaucoma (increased eye pressure)   . Headache(784.0)    07/11/13- none in a long time  . Hepatic steatosis   . Hyperglycemia   . Hyperlipidemia   . Hypertension    no meds  . Sleep apnea     Past Surgical History:  Procedure Laterality Date  . CATARACT EXTRACTION W/PHACO Left 07/16/2013   Procedure: CATARACT EXTRACTION PHACO AND INTRAOCULAR LENS PLACEMENT (IOC) LEFT EYE;  Surgeon: Marylynn Pearson, MD;  Location: [place];  Service: Ophthalmology;  Laterality: Left;  . CATARACT EXTRACTION W/PHACO Right 10/29/2013   Procedure: CATARACT EXTRACTION PHACO AND INTRAOCULAR LENS PLACEMENT (IOC);  Surgeon: Marylynn Pearson, MD;  Location: [place];  Service: Ophthalmology;  Laterality: Right;  . EYE SURGERY     2 on left eye  . FINGER SURGERY Right   . MINI SHUNT INSERTION  03/09/2011   Procedure: INSERTION OF MINI SHUNT;  Surgeon: Marylynn Pearson, MD;  Location: [place];  Service:  Ophthalmology;  Laterality: Left;    Current Medications: Current Meds  Medication Sig  . colchicine 0.6 MG tablet TAKE HOURLY UNTIL GOUT IMPROVED/DIARRHEA, ON FIRST DAY OF TREATMENT.TAKE DAILY THEREAFTER AS NEEDED  . dorzolamide-timolol (COSOPT) 22.3-6.8 MG/ML ophthalmic solution PLACE 1 DROP INTO THE RIGHT EYE 2 (TWO) TIMES DAILY.  . hydrochlorothiazide (HYDRODIURIL) 25 MG tablet TAKE 1 TABLET BY MOUTH EVERY DAY  . losartan (COZAAR) 100 MG tablet TAKE 1 TABLET BY MOUTH EVERY DAY  . metoprolol  succinate (TOPROL-XL) 25 MG 24 hr tablet TAKE 1 TABLET BY MOUTH EVERY DAY  . pantoprazole (PROTONIX) 40 MG tablet TAKE 1 TABLET BY MOUTH TWICE A DAY  . prednisoLONE acetate (PRED FORTE) 1 % ophthalmic suspension INSTILL 1 DROP IN BOTH EYES 4 TIMES DAILY  . rOPINIRole (REQUIP) 0.5 MG tablet Take 1 tablet (0.5 mg total) by mouth at bedtime. (Patient taking differently: Take 0.5 mg by mouth as needed. )  . rosuvastatin (CRESTOR) 40 MG tablet Take 1 tablet (40 mg total) by mouth daily.  . sildenafil (REVATIO) 20 MG tablet Take 1 tablet (20 mg total) by mouth 3 (three) times daily.  . silodosin (RAPAFLO) 8 MG CAPS capsule TAKE 1 CAPSULE BY MOUTH EVERYDAY AT BEDTIME  . traMADol (ULTRAM) 50 MG tablet TAKE 1 TABLET BY MOUTH EVERY 6 HOURS AS NEEDED FOR PAIN   Current Facility-Administered Medications for the 07/23/19 encounter (Office Visit) with Corrin Parker, PA-C  Medication  . lidocaine (XYLOCAINE) 1 % (with pres) injection 4 mL     Allergies:   Keflex [cephalexin]   Social History   Socioeconomic History  . Marital status: Divorced    Spouse name: Not on file  . Number of children: 0  . Years of education: college3  . Highest education level: Not on file  Occupational History  . Occupation: Aeronautical engineer: [employer]  Tobacco Use  . Smoking status: Never Smoker  . Smokeless tobacco: Never Used  Substance and Sexual Activity  . Alcohol use: Yes    Alcohol/week: 2.0 standard drinks    Types: 2 Cans of beer per week    Comment: daily  . Drug use: No  . Sexual activity: Not on file  Other Topics Concern  . Not on file  Social History Narrative  . Not on file   Social Determinants of Health   Financial Resource Strain:   . Difficulty of Paying Living Expenses:   Food Insecurity:   . Worried About Programme researcher, broadcasting/film/video in the Last Year:   . Barista in the Last Year:   Transportation Needs:   . Freight forwarder (Medical):   Marland Kitchen Lack of Transportation  (Non-Medical):   Physical Activity:   . Days of Exercise per Week:   . Minutes of Exercise per Session:   Stress:   . Feeling of Stress :   Social Connections:   . Frequency of Communication with Friends and Family:   . Frequency of Social Gatherings with Friends and Family:   . Attends Religious Services:   . Active Member of Clubs or Organizations:   . Attends Banker Meetings:   Marland Kitchen Marital Status:      Family History: The patient's family history includes Aneurysm in his brother and father; Asthma in his father, mother, and paternal aunt; Early death in his brother and sister; Heart disease in his father, mother, and sister; Hyperlipidemia in his father and mother; Hypertension in his father and  mother; Liver cancer in his mother; Stroke in his father; Thyroid disease in his mother. There is no history of Migraines.  ROS:   Please see the history of present illness.    All other systems reviewed and are negative.  EKGs/Labs/Other Studies Reviewed:    The following studies were reviewed today:  Exercise Tolerance Test 07/22/2015: There was no ST segment deviation noted during stress. Normal ETT at an excellent work load _______________  Echo 08/03/2015: Study Conclusions: - Left ventricle: The cavity size was normal. Wall thickness was  normal. Systolic function was normal. The estimated ejection  fraction was in the range of 60% to 65%. Wall motion was normal;  there were no regional wall motion abnormalities. Doppler  parameters are consistent with abnormal left ventricular  relaxation (grade 1 diastolic dysfunction). The E/e&' ratio is  between 8-15, suggesting indetermiante LV filling pressure.  - Mitral valve: Mildly thickened leaflets . There was trivial  regurgitation.  - Left atrium: The atrium was normal in size.  - Right atrium: The atrium was mildly dilated.  - Inferior vena cava: The vessel was normal in size. The  respirophasic  diameter changes were in the normal range (>= 50%),  consistent with normal central venous pressure.   Impressions:  - LVEF 60-65%, normal wall thickness, normal wall motion, diastolic  dysfunction, indeterminate LV filling pressure, trivial MR,  normal LA size, mildly dilated RA, normal IVC. _______________  Exercise Myoview 05/03/2018:  The left ventricular ejection fraction is normal (55-65%).  Nuclear stress EF: 60%.  Blood pressure demonstrated a normal response to exercise.  The study is normal.  This is a low risk study.   Normal exercise nuclear stress test with no evidence for prior infarct or ischemia.  Excellent exercise capacity and normal blood pressure response to exertion.  EKG:  EKG ordered today. EKG personally reviewed and demonstrates normal sinus rhythm, rate 82 bpm, with some non-specific T wave flattening in aVL. No acute ischemic changes compared to prior tracings.  Recent Labs: No results found for requested labs within last 8760 hours.  Recent Lipid Panel    Component Value Date/Time   CHOL 176 03/08/2018 1631   TRIG 145 03/08/2018 1631   HDL 48 03/08/2018 1631   CHOLHDL 3.7 03/08/2018 1631   VLDL 36.8 10/31/2016 1352   LDLCALC 103 (H) 03/08/2018 1631   LDLDIRECT 144.0 08/06/2012 0917    Physical Exam:    Vital Signs: BP (!) 144/98   Pulse 82   Ht  (1.803 m)   Wt 192 lb 3.2 oz (87.2 kg)   SpO2 99%   BMI 26.81 kg/m     Wt Readings from Last 3 Encounters:  07/23/19 192 lb 3.2 oz (87.2 kg)  12/30/18 189 lb (85.7 kg)  11/29/18 188 lb (85.3 kg)     General: 59 y.o. male in no acute distress. HEENT: Normocephalic and atraumatic. Sclera clear.  Neck: Supple. No carotid bruits. No JVD. Heart: RRR. Distinct S1 and S2. No murmurs, gallops, or rubs. Radial pulses 2+ and equal bilaterally. Lungs: No increased work of breathing. Clear to ausculation bilaterally. No wheezes, rhonchi, or rales.  Abdomen: Soft, non-distended, and  non-tender to palpation.  Extremities: No lower extremity edema.    Skin: Warm and dry. Neuro: No focal deficits. Psych: Normal affect. Responds appropriately.   Assessment:    1. Elevated coronary artery calcium score   2. Essential hypertension   3. Hyperlipidemia, unspecified hyperlipidemia type     Plan:  Elevated Coronary Artery Calcium Score - Coronary calcium score of 451 in 11/2017. Low risk Myoview in 04/2018 with no evidence of ischemia. He has a strong family history of CAD.  - He describes occasional brief episodes of mild chest discomfort after doing heavy lifting that sounds musculoskeletal in nature. Nothing that sounds like angina. - Continue aspirin and high-intensity statin.  - Discussed symptoms concerning for angina and patient will let us know if he develops any of these.  Hypertension - BP mildly elevated at 144/98. - Continue current medications: HCTZ 25mg  daily, Losartan 100mg  daily, and Toprol-XL 25mg  daily.  - BP goal <130/80. Patient to keep log of BP and heart rate for 2 weeks and then notify of his readings. If average BP is above goal, will like increase HCTZ given reports of mild ankle edema. - Will check CMET today.   Hyperlipidemia - Most recent lipid panel in 02/2018: Total Cholesterol 176, Triglycerides 145, HDL 48, LDL 103.  - Crestor increased to 40mg  daily at last office visit.  - Will check lipid panel and CMET today.  Received a message from Dr. (patient's PCP) prior to visit - patient overdue for routine labs (CMET, lipid panel, and hemoglobin A1c) which were ordered in 11/2018. Therefore, will add on hemoglobin A1c to labs today so patient does not have to have labs drawn twice.  Disposition: Follow up in 1 year with Dr. 03/2018.   Medication Adjustments/Labs and Tests Ordered: Current medicines are reviewed at length with the patient today.  Concerns regarding medicines are outlined above.  Orders Placed This Encounter    Procedures  . Comprehensive metabolic panel  . Lipid panel  . Hemoglobin A1c  . EKG 12-Lead   No orders of the defined types were placed in this encounter.   Patient Instructions  Medication Instructions:  Your physician recommends that you continue on your current medications as directed. Please refer to the Current Medication list given to you today.  *If you need a refill on your cardiac medications before your next appointment, please call your pharmacy*   Lab Work: Your physician recommends that you return for lab work today: CMET, Lipid, Hemoglobin A1C  If you have labs (blood work) drawn today and your tests are completely normal, you will receive your results only by: MyChart Message (if you have MyChart) OR . A paper copy in the mail If you have any lab test that is abnormal or we need to change your treatment, we will call you to review the results.   Follow-Up: At [facility], you and your health needs are our priority.  As part of our continuing mission to provide you with exceptional heart care, we have created designated Provider Care Teams.  These Care Teams include your primary Cardiologist (physician) and Advanced Practice Providers (APPs -  Physician Assistants and Nurse Practitioners) who all work together to provide you with the care you need, when you need it.  We recommend signing up for the patient portal called "MyChart".  Sign up information is provided on this After Visit Summary.  MyChart is used to connect with patients for Virtual Visits (Telemedicine).  Patients are able to view lab/test results, encounter notes, upcoming appointments, etc.  Non-urgent messages can be sent to your provider as well.   To learn more about what you can do with MyChart, go to 12/2018.    Your next appointment:   12 month(s)  The format for your next appointment:  In Person  Provider:   You may see Olga Millers, MD or one of the following  Advanced Practice Providers on your designated Care Team:    Corine Shelter, PA-C  [place], [place]  Edd Fabian, FNP  Please call our office 2 months in advance to schedule your follow-up appointment with Dr. Jens Som.  Other Instructions Your physician has requested that you regularly monitor and record your blood pressure readings at home. Please use the same machine, 2 hours after you have taken your medications and after sitting for about 5-10 minutes. Please record them and call us with the results in 2 weeks.   Blood Pressure Record Sheet To take your blood pressure, you will need a blood pressure machine. You can buy a blood pressure machine (blood pressure monitor) at your clinic, drug store, or online. When choosing one, consider:  An automatic monitor that has an arm cuff.  A cuff that wraps snugly around your upper arm. You should be able to fit only one finger between your arm and the cuff.  A device that stores blood pressure reading results.  Do not choose a monitor that measures your blood pressure from your wrist or finger. Follow your health care provider's instructions for how to take your blood pressure. To use this form:  Get one reading in the morning (a.m.) before you take any medicines.  Get one reading in the evening (p.m.) before supper.  Take at least 2 readings with each blood pressure check. This makes sure the results are correct. Wait 1-2 minutes between measurements.  Write down the results in the spaces on this form.  Repeat this once a week, or as told by your health care provider.  Make a follow-up appointment with your health care provider to discuss the results.  Blood pressure log  Date: _______________________  a.m. _____________________(1st reading) _____________________(2nd reading)  p.m. _____________________(1st reading) _____________________(2nd reading)  Date: _______________________  a.m. _____________________(1st  reading) _____________________(2nd reading)  p.m. _____________________(1st reading) _____________________(2nd reading)  Date: _______________________  a.m. _____________________(1st reading) _____________________(2nd reading)  p.m. _____________________(1st reading) _____________________(2nd reading)  Date: _______________________  a.m. _____________________(1st reading) _____________________(2nd reading)  p.m. _____________________(1st reading) _____________________(2nd reading)  Date: _______________________  a.m. _____________________(1st reading) _____________________(2nd reading)  p.m. _____________________(1st reading) _____________________(2nd reading)\  Date: _______________________  a.m. _____________________(1st reading) _____________________(2nd reading)  p.m. _____________________(1st reading) _____________________(2nd reading)  Date: _______________________  a.m. _____________________(1st reading) _____________________(2nd reading)  p.m. _____________________(1st reading) _____________________(2nd reading)  Date: _______________________  a.m. _____________________(1st reading) _____________________(2nd reading)  p.m. _____________________(1st reading) _____________________(2nd reading)  Date: _______________________  a.m. _____________________(1st reading) _____________________(2nd reading)  p.m. _____________________(1st reading) _____________________(2nd reading)  Date: _______________________  a.m. _____________________(1st reading) _____________________(2nd reading)  p.m. _____________________(1st reading) _____________________(2nd reading)  Date: _______________________  a.m. _____________________(1st reading) _____________________(2nd reading)  p.m. _____________________(1st reading) _____________________(2nd reading)  Date: _______________________  a.m. _____________________(1st reading) _____________________(2nd reading)  p.m.  _____________________(1st reading) _____________________(2nd reading)  Date: _______________________  a.m. _____________________(1st reading) _____________________(2nd reading)  p.m. _____________________(1st reading) _____________________(2nd reading)  Date: _______________________  a.m. _____________________(1st reading) _____________________(2nd reading)  p.m. _____________________(1st reading) _____________________(2nd reading)  Date: _______________________  a.m. _____________________(1st reading) _____________________(2nd reading)  p.m. _____________________(1st reading) _____________________(2nd reading)  Date: _______________________  a.m. _____________________(1st reading) _____________________(2nd reading)  p.m. _____________________(1st reading) _____________________(2nd reading)  Date: _______________________  a.m. _____________________(1st reading) _____________________(2nd reading)  p.m. _____________________(1st reading) _____________________(2nd reading)  Date: _______________________  a.m. _____________________(1st reading) _____________________(2nd reading)  p.m. _____________________(1st reading) _____________________(2nd reading)  Date: _______________________  a.m. _____________________(1st reading) _____________________(2nd reading)  p.m. _____________________(1st reading) _____________________(2nd reading)  Date: _______________________  a.m. _____________________(1st reading) _____________________(2nd reading)  p.m. _____________________(1st reading) _____________________(2nd reading)  Date: _______________________  a.m. _____________________(1st reading) _____________________(2nd reading)  p.m. _____________________(1st reading) _____________________(2nd reading)  Date: _______________________  a.m. _____________________(1st reading) _____________________(2nd reading)  p.m. _____________________(1st reading)  _____________________(2nd reading)  Date: _______________________  a.m. _____________________(1st reading) _____________________(2nd reading)  p.m. _____________________(1st reading) _____________________(2nd reading)  Date: _______________________  a.m. _____________________(1st reading) _____________________(2nd reading)  p.m. _____________________(1st reading) _____________________(2nd reading)  Date: _______________________  a.m. _____________________(1st reading) _____________________(2nd reading)  p.m. _____________________(1st reading) _____________________(2nd reading)  This information is not intended to replace advice given to you by your health care provider. Make sure you discuss any questions you have with your health care provider. Document Revised: 04/06/2017 Document Reviewed: 02/06/2017 Elsevier Patient Education  2020 [employer]   How to Take Your Blood Pressure You can take your blood pressure at home with a machine. You may need to check your blood pressure at home:  To check if you have high blood pressure (hypertension).  To check your blood pressure over time.  To make sure your blood pressure medicine is working. Supplies needed: You will need a blood pressure machine, or monitor. You can buy one at a drugstore or online. When choosing one:  Choose one with an arm cuff.  Choose one that wraps around your upper arm. Only one finger should fit between your arm and the cuff.  Do not choose one that measures your blood pressure from your wrist or finger. Your doctor can suggest a monitor. How to prepare Avoid these things for 30 minutes before checking your blood pressure:  Drinking caffeine.  Drinking alcohol.  Eating.  Smoking.  Exercising. Five minutes before checking your blood pressure:  Pee.  Sit in a dining chair. Avoid sitting in a soft couch or armchair.  Be quiet. Do not talk. How to take your blood pressure Follow the  instructions that came with your machine. If you have a digital blood pressure monitor, these may be the instructions: 1. Sit up straight. 2. Place your feet on the floor. Do not cross your ankles or legs. 3. Rest your left arm at the level of your heart. You may rest it on a table, desk, or chair. 4. Pull up your shirt sleeve. 5. Wrap the blood pressure cuff around the upper part of your left arm. The cuff should be 1 inch (2.5 cm) above your elbow. It is best to wrap the cuff around bare skin. 6. Fit the cuff snugly around your arm. You should be able to place only one finger between the cuff and your arm. 7. Put the cord inside the groove of your elbow. 8. Press the power button. 9. Sit quietly while the cuff fills with air and loses air. 10. Write down the numbers on the screen. 11. Wait 2-3 minutes and then repeat steps 1-10. What do the numbers mean? Two numbers make up your blood pressure. The first number is called systolic pressure. The second is called diastolic pressure. An example of a blood pressure reading is "120 over 80" (or 120/80). If you are an adult and do not have a medical condition, use this guide to find out if your blood pressure is normal: Normal  First number: below 120.  Second number: below 80. Elevated  First number: 120-129.  Second number: below 80. Hypertension stage 1  First number: 130-139.  Second number: 80-89. Hypertension stage 2  First number: 140 or above.  Second number: 90 or above.  Your blood pressure is above normal even if only the top or bottom number is above normal. Follow these instructions at home:  Check your blood pressure as often as your doctor tells you to.  Take your monitor to your next doctor's appointment. Your doctor will: ? Make sure you are using it correctly. ? Make sure it is working right.  Make sure you understand what your blood pressure numbers should be.  Tell your doctor if your medicines are causing  side effects. Contact a doctor if:  Your blood pressure keeps being high. Get help right away if:  Your first blood pressure number is higher than 180.  Your second blood pressure number is higher than 120. This information is not intended to replace advice given to you by your health care provider. Make sure you discuss any questions you have with your health care provider. Document Revised: 01/19/2017 Document Reviewed: 07/16/2015 Elsevier Patient Education  [address]        Signed, Corrin Parker, [place]  07/23/2019 3:24 PM    [facility]

## 2019-07-19 ENCOUNTER — Other Ambulatory Visit: Payer: Self-pay | Admitting: Internal Medicine

## 2019-07-20 ENCOUNTER — Other Ambulatory Visit: Payer: Self-pay | Admitting: Internal Medicine

## 2019-07-22 NOTE — Telephone Encounter (Signed)
Refill denied until he has OV.  6 month follow up required for refills on all controlled substances.  Can you review his chart and make sure his contract is up to date

## 2019-07-23 ENCOUNTER — Encounter: Payer: Self-pay | Admitting: Student

## 2019-07-23 ENCOUNTER — Ambulatory Visit: Payer: BC Managed Care – PPO | Admitting: Student

## 2019-07-23 ENCOUNTER — Other Ambulatory Visit: Payer: Self-pay

## 2019-07-23 ENCOUNTER — Telehealth: Payer: Self-pay | Admitting: Internal Medicine

## 2019-07-23 VITALS — BP 144/98 | HR 82 | Ht 71.0 in | Wt 192.2 lb

## 2019-07-23 DIAGNOSIS — E785 Hyperlipidemia, unspecified: Secondary | ICD-10-CM | POA: Diagnosis not present

## 2019-07-23 DIAGNOSIS — I10 Essential (primary) hypertension: Secondary | ICD-10-CM

## 2019-07-23 DIAGNOSIS — R931 Abnormal findings on diagnostic imaging of heart and coronary circulation: Secondary | ICD-10-CM | POA: Diagnosis not present

## 2019-07-23 NOTE — Telephone Encounter (Signed)
Spoke with pt to let him know that there is some labs in epic that he can have done while at the cardiology office. Pt gave a verbal understanding and stated that he would let them know.

## 2019-07-23 NOTE — Patient Instructions (Addendum)
Medication Instructions:  Your physician recommends that you continue on your current medications as directed. Please refer to the Current Medication list given to you today.  *If you need a refill on your cardiac medications before your next appointment, please call your pharmacy*   Lab Work: Your physician recommends that you return for lab work today: CMET, Lipid, Hemoglobin A1C  If you have labs (blood work) drawn today and your tests are completely normal, you will receive your results only by: Marland Kitchen MyChart Message (if you have MyChart) OR . A paper copy in the mail If you have any lab test that is abnormal or we need to change your treatment, we will call you to review the results.   Follow-Up: At [facility], you and your health needs are our priority.  As part of our continuing mission to provide you with exceptional heart care, we have created designated Provider Care Teams.  These Care Teams include your primary Cardiologist (physician) and Advanced Practice Providers (APPs -  Physician Assistants and Nurse Practitioners) who all work together to provide you with the care you need, when you need it.  We recommend signing up for the patient portal called "MyChart".  Sign up information is provided on this After Visit Summary.  MyChart is used to connect with patients for Virtual Visits (Telemedicine).  Patients are able to view lab/test results, encounter notes, upcoming appointments, etc.  Non-urgent messages can be sent to your provider as well.   To learn more about what you can do with MyChart, go to [email or web address].    Your next appointment:   12 month(s)  The format for your next appointment:   In Person  Provider:   You may see Kirk Ruths, MD or one of the following Advanced Practice Providers on your designated Care Team:    Kerin Ransom, PA-C  [place], [place]  Coletta Memos, FNP  Please call our office 2 months in advance to schedule your  follow-up appointment with Dr. Stanford Breed.  Other Instructions Your physician has requested that you regularly monitor and record your blood pressure readings at home. Please use the same machine, 2 hours after you have taken your medications and after sitting for about 5-10 minutes. Please record them and call us with the results in 2 weeks.   Blood Pressure Record Sheet To take your blood pressure, you will need a blood pressure machine. You can buy a blood pressure machine (blood pressure monitor) at your clinic, drug store, or online. When choosing one, consider:  An automatic monitor that has an arm cuff.  A cuff that wraps snugly around your upper arm. You should be able to fit only one finger between your arm and the cuff.  A device that stores blood pressure reading results.  Do not choose a monitor that measures your blood pressure from your wrist or finger. Follow your health care provider's instructions for how to take your blood pressure. To use this form:  Get one reading in the morning (a.m.) before you take any medicines.  Get one reading in the evening (p.m.) before supper.  Take at least 2 readings with each blood pressure check. This makes sure the results are correct. Wait 1-2 minutes between measurements.  Write down the results in the spaces on this form.  Repeat this once a week, or as told by your health care provider.  Make a follow-up appointment with your health care provider to discuss the results.  Blood pressure log  Date: _______________________  a.m. _____________________(1st reading) _____________________(2nd reading)  p.m. _____________________(1st reading) _____________________(2nd reading)  Date: _______________________  a.m. _____________________(1st reading) _____________________(2nd reading)  p.m. _____________________(1st reading) _____________________(2nd reading)  Date: _______________________  a.m. _____________________(1st  reading) _____________________(2nd reading)  p.m. _____________________(1st reading) _____________________(2nd reading)  Date: _______________________  a.m. _____________________(1st reading) _____________________(2nd reading)  p.m. _____________________(1st reading) _____________________(2nd reading)  Date: _______________________  a.m. _____________________(1st reading) _____________________(2nd reading)  p.m. _____________________(1st reading) _____________________(2nd reading)\  Date: _______________________  a.m. _____________________(1st reading) _____________________(2nd reading)  p.m. _____________________(1st reading) _____________________(2nd reading)  Date: _______________________  a.m. _____________________(1st reading) _____________________(2nd reading)  p.m. _____________________(1st reading) _____________________(2nd reading)  Date: _______________________  a.m. _____________________(1st reading) _____________________(2nd reading)  p.m. _____________________(1st reading) _____________________(2nd reading)  Date: _______________________  a.m. _____________________(1st reading) _____________________(2nd reading)  p.m. _____________________(1st reading) _____________________(2nd reading)  Date: _______________________  a.m. _____________________(1st reading) _____________________(2nd reading)  p.m. _____________________(1st reading) _____________________(2nd reading)  Date: _______________________  a.m. _____________________(1st reading) _____________________(2nd reading)  p.m. _____________________(1st reading) _____________________(2nd reading)  Date: _______________________  a.m. _____________________(1st reading) _____________________(2nd reading)  p.m. _____________________(1st reading) _____________________(2nd reading)  Date: _______________________  a.m. _____________________(1st reading) _____________________(2nd reading)  p.m.  _____________________(1st reading) _____________________(2nd reading)  Date: _______________________  a.m. _____________________(1st reading) _____________________(2nd reading)  p.m. _____________________(1st reading) _____________________(2nd reading)  Date: _______________________  a.m. _____________________(1st reading) _____________________(2nd reading)  p.m. _____________________(1st reading) _____________________(2nd reading)  Date: _______________________  a.m. _____________________(1st reading) _____________________(2nd reading)  p.m. _____________________(1st reading) _____________________(2nd reading)  Date: _______________________  a.m. _____________________(1st reading) _____________________(2nd reading)  p.m. _____________________(1st reading) _____________________(2nd reading)  Date: _______________________  a.m. _____________________(1st reading) _____________________(2nd reading)  p.m. _____________________(1st reading) _____________________(2nd reading)  Date: _______________________  a.m. _____________________(1st reading) _____________________(2nd reading)  p.m. _____________________(1st reading) _____________________(2nd reading)  Date: _______________________  a.m. _____________________(1st reading) _____________________(2nd reading)  p.m. _____________________(1st reading) _____________________(2nd reading)  Date: _______________________  a.m. _____________________(1st reading) _____________________(2nd reading)  p.m. _____________________(1st reading) _____________________(2nd reading)  Date: _______________________  a.m. _____________________(1st reading) _____________________(2nd reading)  p.m. _____________________(1st reading) _____________________(2nd reading)  Date: _______________________  a.m. _____________________(1st reading) _____________________(2nd reading)  p.m. _____________________(1st reading)  _____________________(2nd reading)  Date: _______________________  a.m. _____________________(1st reading) _____________________(2nd reading)  p.m. _____________________(1st reading) _____________________(2nd reading)  Date: _______________________  a.m. _____________________(1st reading) _____________________(2nd reading)  p.m. _____________________(1st reading) _____________________(2nd reading)  This information is not intended to replace advice given to you by your health care provider. Make sure you discuss any questions you have with your health care provider. Document Revised: 04/06/2017 Document Reviewed: 02/06/2017 Elsevier Patient Education  2020 [employer].   How to Take Your Blood Pressure You can take your blood pressure at home with a machine. You may need to check your blood pressure at home:  To check if you have high blood pressure (hypertension).  To check your blood pressure over time.  To make sure your blood pressure medicine is working. Supplies needed: You will need a blood pressure machine, or monitor. You can buy one at a drugstore or online. When choosing one:  Choose one with an arm cuff.  Choose one that wraps around your upper arm. Only one finger should fit between your arm and the cuff.  Do not choose one that measures your blood pressure from your wrist or finger. Your doctor can suggest a monitor. How to prepare Avoid these things for 30 minutes before checking your blood pressure:  Drinking caffeine.  Drinking alcohol.  Eating.  Smoking.  Exercising. Five minutes before checking your blood pressure:  Pee.  Sit in a dining chair. Avoid sitting in a soft couch or armchair.  Be quiet.  Do not talk. How to take your blood pressure Follow the instructions that came with your machine. If you have a digital blood pressure monitor, these may be the instructions: 1. Sit up straight. 2. Place your feet on the floor. Do not cross your  ankles or legs. 3. Rest your left arm at the level of your heart. You may rest it on a table, desk, or chair. 4. Pull up your shirt sleeve. 5. Wrap the blood pressure cuff around the upper part of your left arm. The cuff should be 1 inch (2.5 cm) above your elbow. It is best to wrap the cuff around bare skin. 6. Fit the cuff snugly around your arm. You should be able to place only one finger between the cuff and your arm. 7. Put the cord inside the groove of your elbow. 8. Press the power button. 9. Sit quietly while the cuff fills with air and loses air. 10. Write down the numbers on the screen. 11. Wait 2-3 minutes and then repeat steps 1-10. What do the numbers mean? Two numbers make up your blood pressure. The first number is called systolic pressure. The second is called diastolic pressure. An example of a blood pressure reading is "120 over 80" (or 120/80). If you are an adult and do not have a medical condition, use this guide to find out if your blood pressure is normal: Normal  First number: below 120.  Second number: below 80. Elevated  First number: 120-129.  Second number: below 80. Hypertension stage 1  First number: 130-139.  Second number: 80-89. Hypertension stage 2  First number: 140 or above.  Second number: 90 or above. Your blood pressure is above normal even if only the top or bottom number is above normal. Follow these instructions at home:  Check your blood pressure as often as your doctor tells you to.  Take your monitor to your next doctor's appointment. Your doctor will: ? Make sure you are using it correctly. ? Make sure it is working right.  Make sure you understand what your blood pressure numbers should be.  Tell your doctor if your medicines are causing side effects. Contact a doctor if:  Your blood pressure keeps being high. Get help right away if:  Your first blood pressure number is higher than 180.  Your second blood pressure  number is higher than 120. This information is not intended to replace advice given to you by your health care provider. Make sure you discuss any questions you have with your health care provider. Document Revised: 01/19/2017 Document Reviewed: 07/16/2015 Elsevier Patient Education  2020 [employer].

## 2019-07-23 NOTE — Telephone Encounter (Signed)
Pt has an appt scheduled for 07/25/2019.

## 2019-07-23 NOTE — Telephone Encounter (Signed)
Pt has an appt with Cardiology today and wants to know if Dr Darrick Huntsman has any lab work for him to do. He states that he could get it done at his appt today since they are a part of [facility] too. Please advise

## 2019-07-23 NOTE — Telephone Encounter (Signed)
He has had a CMET,  HGBA1c and lipid panel ordered but not done since October.  They should be able to see it

## 2019-07-24 ENCOUNTER — Other Ambulatory Visit: Payer: Self-pay | Admitting: Internal Medicine

## 2019-07-24 ENCOUNTER — Telehealth: Payer: Self-pay | Admitting: Student

## 2019-07-24 ENCOUNTER — Other Ambulatory Visit: Payer: Self-pay

## 2019-07-24 DIAGNOSIS — E785 Hyperlipidemia, unspecified: Secondary | ICD-10-CM

## 2019-07-24 LAB — COMPREHENSIVE METABOLIC PANEL
ALT: 65 IU/L — ABNORMAL HIGH (ref 0–44)
AST: 54 IU/L — ABNORMAL HIGH (ref 0–40)
Albumin/Globulin Ratio: 1.7 (ref 1.2–2.2)
Albumin: 4.3 g/dL (ref 3.8–4.9)
Alkaline Phosphatase: 79 IU/L (ref 48–121)
BUN/Creatinine Ratio: 19 (ref 9–20)
BUN: 17 mg/dL (ref 6–24)
Bilirubin Total: 0.6 mg/dL (ref 0.0–1.2)
CO2: 27 mmol/L (ref 20–29)
Calcium: 9.6 mg/dL (ref 8.7–10.2)
Chloride: 97 mmol/L (ref 96–106)
Creatinine, Ser: 0.9 mg/dL (ref 0.76–1.27)
GFR calc Af Amer: 109 mL/min/{1.73_m2} (ref >59)
GFR calc non Af Amer: 94 mL/min/{1.73_m2} (ref >59)
Globulin, Total: 2.6 g/dL (ref 1.5–4.5)
Glucose: 121 mg/dL — ABNORMAL HIGH (ref 65–99)
Potassium: 4.8 mmol/L (ref 3.5–5.2)
Sodium: 138 mmol/L (ref 134–144)
Total Protein: 6.9 g/dL (ref 6.0–8.5)

## 2019-07-24 LAB — LIPID PANEL
Chol/HDL Ratio: 2.6 ratio (ref 0.0–5.0)
Cholesterol, Total: 179 mg/dL (ref 100–199)
HDL: 68 mg/dL (ref >39)
LDL Chol Calc (NIH): 85 mg/dL (ref 0–99)
Triglycerides: 151 mg/dL — ABNORMAL HIGH (ref 0–149)
VLDL Cholesterol Cal: 26 mg/dL (ref 5–40)

## 2019-07-24 LAB — HEMOGLOBIN A1C
Est. average glucose Bld gHb Est-mCnc: 120 mg/dL
Hgb A1c MFr Bld: 5.8 % — ABNORMAL HIGH (ref 4.8–5.6)

## 2019-07-24 NOTE — Telephone Encounter (Signed)
Called patient back- gave lab results.  Ordered blood work per [employer].

## 2019-07-24 NOTE — Telephone Encounter (Signed)
     Pt is returning call from [place] regarding his lab results

## 2019-07-25 ENCOUNTER — Telehealth (INDEPENDENT_AMBULATORY_CARE_PROVIDER_SITE_OTHER): Payer: BC Managed Care – PPO | Admitting: Internal Medicine

## 2019-07-25 ENCOUNTER — Encounter: Payer: Self-pay | Admitting: Internal Medicine

## 2019-07-25 DIAGNOSIS — I2510 Atherosclerotic heart disease of native coronary artery without angina pectoris: Secondary | ICD-10-CM

## 2019-07-25 DIAGNOSIS — M130 Polyarthritis, unspecified: Secondary | ICD-10-CM

## 2019-07-25 DIAGNOSIS — I2584 Coronary atherosclerosis due to calcified coronary lesion: Secondary | ICD-10-CM

## 2019-07-25 DIAGNOSIS — Z9189 Other specified personal risk factors, not elsewhere classified: Secondary | ICD-10-CM

## 2019-07-25 DIAGNOSIS — K760 Fatty (change of) liver, not elsewhere classified: Secondary | ICD-10-CM | POA: Diagnosis not present

## 2019-07-25 DIAGNOSIS — I10 Essential (primary) hypertension: Secondary | ICD-10-CM

## 2019-07-25 DIAGNOSIS — M542 Cervicalgia: Secondary | ICD-10-CM | POA: Diagnosis not present

## 2019-07-25 MED ORDER — TRAMADOL HCL 50 MG PO TABS: 50.0000 mg | ORAL_TABLET | Freq: Four times a day (QID) | ORAL | 2 refills | Status: DC | PRN

## 2019-07-27 NOTE — Assessment & Plan Note (Signed)
Presumed by ultrasound changes and serologies negative for autoimmune causes of hepatitis.  Current liver enzymes are elevated ,  and all modifiable risk factors including obesity, alcohol abuse and hyperlipidemia have been addressed.  He continues to engage in regular alcohol use and is aware of the risk of  Progression to cirrhosis   Lab Results  Component Value Date   ALT 65 (H) 07/23/2019   AST 54 (H) 07/23/2019   ALKPHOS 79 07/23/2019   BILITOT 0.6 07/23/2019

## 2019-07-27 NOTE — Assessment & Plan Note (Addendum)
Despite his high coronary calcium score,  He had a "low risk " myoview.  He is taking aspirin,  High dose crestor , beta blocker and ARB . Goal LDL is 70 and he is above goal  on 40 mg rosuvastatin.    Lab Results  Component Value Date   CHOL 179 07/23/2019   HDL 68 07/23/2019   LDLCALC 85 07/23/2019   LDLDIRECT 144.0 08/06/2012   TRIG 151 (H) 07/23/2019   CHOLHDL 2.6 07/23/2019

## 2019-07-27 NOTE — Assessment & Plan Note (Signed)
His  fasting  Glucose was elevated and  diagnostic of diabetes in Sept 2018 ,  But he  lowered his A1 to 5.6 with  healthy  lifestyle changes including Mediterranean diet , weight loss  and regular  exercise . Discussed the rise to 5.8 and recommended low GI diet and regular participation in exercise.    Lab Results  Component Value Date   HGBA1C 5.8 (H) 07/23/2019    

## 2019-07-27 NOTE — Assessment & Plan Note (Signed)
Not at goal on hctz 25 mg, osartan 100 mg and metoprolol 25 mg daily . Renal function stable.  He has been asked to check her  BP  And pulse at home and  submit readings for evaluation. Renal function, electrolytes and screen for proteinuria are all normal .  Lab Results  Component Value Date   CREATININE 0.90 07/23/2019   Lab Results  Component Value Date   NA 138 07/23/2019   K 4.8 07/23/2019   CL 97 07/23/2019   CO2 27 07/23/2019   Lab Results  Component Value Date   MICROALBUR 1.3 05/14/2014   MICROALBUR 1.4 07/26/2012

## 2019-07-27 NOTE — Progress Notes (Signed)
Telephone  Note  This visit type was conducted due to national recommendations for restrictions regarding the COVID-19 pandemic (e.g. social distancing).  This format is felt to be most appropriate for this patient at this time.  All issues noted in this document were discussed and addressed.  No physical exam was performed (except for noted visual exam findings with Video Visits).   I connected with@ on 07/25/19 at  4:30 PM EDT by  telephone and verified that I am speaking with the correct person using two identifiers. Location patient: home Location provider: work or home office Persons participating in the virtual visit: patient, provider  I discussed the limitations, risks, security and privacy concerns of performing an evaluation and management service by telephone and the availability of in person appointments. I also discussed with the patient that there may be a patient responsible charge related to this service. The patient expressed understanding and agreed to proceed.  Reason for visit: follow up on chronic pain , fatty liver,  And prediabetes  HPI:  59 yr old male with fatty liver, prediabetes, hyperlipidemia recently underwent noninvasive cardiac evaluation with coronary calcium CT score,  Presents for 6 month follow up on chronic issues  Patient is taking his medications as prescribed and notes no adverse effects.  Home BP readings have been done about once per week and are  generally < 140/80 .  he is avoiding added salt in his diet and walking regularly about 3 times per week for exercise .  He has reduced his alcohol consumption but not stopped completely.  He is following a low GI diet about 50% of the time  Recent labs and diet reviewed.   Chronic pain: his headaches have improved and he has reduced his use of tramadol  To twice daily, and a prescription or #120 is lasting two months based on review of controlled substance database.     ROS: See pertinent positives and  negatives per HPI.  Past Medical History:  Diagnosis Date   Arthritis    KNEES   Cataract    REMOVED BILATERAL   Claustrophobia    GERD (gastroesophageal reflux disease)    with certain foods   Glaucoma (increased eye pressure)    Headache(784.0)    07/11/13- none in a long time   Hepatic steatosis    Hyperglycemia    Hyperlipidemia    Hypertension    no meds   Sleep apnea     Past Surgical History:  Procedure Laterality Date   CATARACT EXTRACTION W/PHACO Left 07/16/2013   Procedure: CATARACT EXTRACTION PHACO AND INTRAOCULAR LENS PLACEMENT ([place]) LEFT EYE;  Surgeon: Marylynn Pearson, MD;  Location: [facility];  Service: Ophthalmology;  Laterality: Left;   CATARACT EXTRACTION W/PHACO Right 10/29/2013   Procedure: CATARACT EXTRACTION PHACO AND INTRAOCULAR LENS PLACEMENT ([facility]);  Surgeon: Marylynn Pearson, MD;  Location: [place];  Service: Ophthalmology;  Laterality: Right;   EYE SURGERY     2 on left eye   FINGER SURGERY Right    MINI SHUNT INSERTION  03/09/2011   Procedure: INSERTION OF MINI SHUNT;  Surgeon: Marylynn Pearson, MD;  Location: [place];  Service: Ophthalmology;  Laterality: Left;    Family History  Problem Relation Age of Onset   Asthma Mother    Hyperlipidemia Mother    Heart disease Mother    Hypertension Mother    Liver cancer Mother    Thyroid disease Mother    Asthma Father    Hyperlipidemia Father  Heart disease Father    Stroke Father    Hypertension Father    Aneurysm Father    Aneurysm Brother        non smoker    Early death Brother    Asthma Paternal Aunt    Heart disease Sister    Early death Sister    Migraines Neg Hx     SOCIAL HX:  reports that he has never smoked. He has never used smokeless tobacco. He reports current alcohol use of about 10.0 standard drinks of alcohol per week. He reports that he does not use drugs.   Current Outpatient Medications:    colchicine 0.6 MG tablet, TAKE HOURLY UNTIL GOUT  IMPROVED/DIARRHEA, ON FIRST DAY OF TREATMENT.TAKE DAILY THEREAFTER AS NEEDED, Disp: 270 tablet, Rfl: 1   dorzolamide-timolol (COSOPT) 22.3-6.8 MG/ML ophthalmic solution, PLACE 1 DROP INTO THE RIGHT EYE 2 (TWO) TIMES DAILY., Disp: , Rfl: 11   hydrochlorothiazide (HYDRODIURIL) 25 MG tablet, TAKE 1 TABLET BY MOUTH EVERY DAY, Disp: 90 tablet, Rfl: 3   losartan (COZAAR) 100 MG tablet, TAKE 1 TABLET BY MOUTH EVERY DAY, Disp: 90 tablet, Rfl: 3   metoprolol succinate (TOPROL-XL) 25 MG 24 hr tablet, TAKE 1 TABLET BY MOUTH EVERY DAY, Disp: 90 tablet, Rfl: 1   mometasone (NASONEX) 50 MCG/ACT nasal spray, SPRAY 2 SPRAYS INTO EACH NOSTRIL EVERY DAY, Disp: , Rfl:    pantoprazole (PROTONIX) 40 MG tablet, TAKE 1 TABLET BY MOUTH TWICE A DAY, Disp: 180 tablet, Rfl: 1   prednisoLONE acetate (PRED FORTE) 1 % ophthalmic suspension, INSTILL 1 DROP IN BOTH EYES 4 TIMES DAILY, Disp: , Rfl:    rOPINIRole (REQUIP) 0.5 MG tablet, Take 1 tablet (0.5 mg total) by mouth at bedtime. (Patient taking differently: Take 0.5 mg by mouth as needed. ), Disp: 90 tablet, Rfl: 1   rosuvastatin (CRESTOR) 40 MG tablet, Take 1 tablet (40 mg total) by mouth daily., Disp: 90 tablet, Rfl: 0   sildenafil (REVATIO) 20 MG tablet, Take 1 tablet (20 mg total) by mouth 3 (three) times daily., Disp: 30 tablet, Rfl: 0   silodosin (RAPAFLO) 8 MG CAPS capsule, TAKE 1 CAPSULE BY MOUTH EVERYDAY AT BEDTIME, Disp: , Rfl: 11   traMADol (ULTRAM) 50 MG tablet, Take 1 tablet (50 mg total) by mouth every 6 (six) hours as needed. for pain, Disp: 120 tablet, Rfl: 2  Current Facility-Administered Medications:    lidocaine (XYLOCAINE) 1 % (with pres) injection 4 mL, 4 mL, Other, Once, Darrick Huntsman, Mar Daring, MD  EXAMTheodoro Kos per patient if applicable: General impression: alert, cooperative and articulate.  No signs of being in distress  Lungs: speech is fluent sentence length suggests that patient is not short of breath and not punctuated by cough, sneezing  or sniffing. Marland Kitchen   Psych: affect normal.  speech is articulate and non pressured .  Denies suicidal thoughts   ASSESSMENT AND PLAN:  Discussed the following assessment and plan:  Cervicalgia of occipito-atlanto-axial region  Coronary artery disease due to calcified coronary lesion  Hepatic steatosis  At risk for diabetes mellitus  Essential hypertension  Polyarthritis  Cervicalgia of occipito-atlanto-axial region Referral to [facility] recommended and accepted. Continue tramadol bid for shoulder and neck pain   Coronary artery disease due to calcified coronary lesion Despite his high coronary calcium score,  He had a "low risk " myoview.  He is taking aspirin,  High dose crestor , beta blocker and ARB . Goal LDL is 70 and he is  above goal  on 40 mg rosuvastatin.    Lab Results  Component Value Date   CHOL 179 07/23/2019   HDL 68 07/23/2019   LDLCALC 85 07/23/2019   LDLDIRECT 144.0 08/06/2012   TRIG 151 (H) 07/23/2019   CHOLHDL 2.6 07/23/2019     Hepatic steatosis Presumed by ultrasound changes and serologies negative for autoimmune causes of hepatitis.  Current liver enzymes are elevated ,  and all modifiable risk factors including obesity, alcohol abuse and hyperlipidemia have been addressed.  He continues to engage in regular alcohol use and is aware of the risk of  Progression to cirrhosis   Lab Results  Component Value Date   ALT 65 (H) 07/23/2019   AST 54 (H) 07/23/2019   ALKPHOS 79 07/23/2019   BILITOT 0.6 07/23/2019     At risk for diabetes mellitus His  fasting  Glucose was elevated and  diagnostic of diabetes in Sept 2018 ,  But he  lowered his A1 to 5.6 with  healthy  lifestyle changes including Mediterranean diet , weight loss  and regular  exercise . Discussed the rise to 5.8 and recommended low GI diet and regular participation in exercise.   Lab Results  Component Value Date   HGBA1C 5.8 (H) 07/23/2019      HTN (hypertension) Not at goal on  hctz 25 mg, osartan 100 mg and metoprolol 25 mg daily . Renal function stable.  He has been asked to check her  BP  And pulse at home and  submit readings for evaluation. Renal function, electrolytes and screen for proteinuria are all normal .  Lab Results  Component Value Date   CREATININE 0.90 07/23/2019   Lab Results  Component Value Date   NA 138 07/23/2019   K 4.8 07/23/2019   CL 97 07/23/2019   CO2 27 07/23/2019   Lab Results  Component Value Date   MICROALBUR 1.3 05/14/2014   MICROALBUR 1.4 07/26/2012       Polyarthritis Continue use use of tramadol and tylenol.  Refill history confirmed via [place] Controlled Substance databas, accessed by me today..    I discussed the assessment and treatment plan with the patient. The patient was provided an opportunity to ask questions and all were answered. The patient agreed with the plan and demonstrated an understanding of the instructions.   The patient was advised to call back or seek an in-person evaluation if the symptoms worsen or if the condition fails to improve as anticipated.  I provided  30 minutes of non-face-to-face time during this encounter.   Sherlene Shams, MD

## 2019-07-27 NOTE — Assessment & Plan Note (Signed)
Continue use use of tramadol and tylenol.  Refill history confirmed via [place] Controlled Substance databas, accessed by me today.Marland Kitchen

## 2019-07-27 NOTE — Assessment & Plan Note (Signed)
Referral to [facility] recommended and accepted. Continue tramadol bid for shoulder and neck pain

## 2019-08-11 ENCOUNTER — Telehealth: Payer: Self-pay | Admitting: Hematology and Oncology

## 2019-08-11 NOTE — Telephone Encounter (Signed)
error 

## 2019-08-24 ENCOUNTER — Other Ambulatory Visit: Payer: Self-pay | Admitting: Internal Medicine

## 2019-09-01 ENCOUNTER — Telehealth: Payer: Self-pay | Admitting: Internal Medicine

## 2019-09-01 NOTE — Telephone Encounter (Signed)
Pt is scheduled to see Fransico Setters, NP tomorrow.

## 2019-09-01 NOTE — Telephone Encounter (Signed)
Pt needs an xray on right knee. Please call back asap

## 2019-09-02 ENCOUNTER — Ambulatory Visit (INDEPENDENT_AMBULATORY_CARE_PROVIDER_SITE_OTHER): Payer: BC Managed Care – PPO

## 2019-09-02 ENCOUNTER — Other Ambulatory Visit: Payer: Self-pay

## 2019-09-02 ENCOUNTER — Ambulatory Visit: Payer: BC Managed Care – PPO | Admitting: Nurse Practitioner

## 2019-09-02 ENCOUNTER — Encounter: Payer: Self-pay | Admitting: Nurse Practitioner

## 2019-09-02 VITALS — BP 130/98 | HR 83 | Temp 97.7°F | Ht 71.0 in | Wt 193.0 lb

## 2019-09-02 DIAGNOSIS — M899 Disorder of bone, unspecified: Secondary | ICD-10-CM

## 2019-09-02 DIAGNOSIS — M25561 Pain in right knee: Secondary | ICD-10-CM

## 2019-09-02 NOTE — Patient Instructions (Addendum)
Knee pain and swelling after fall.   To the Xray today.  Continue with rest, ice, elevation, I think a knee sleeve may be more comfortable for you.  Ibuprofen if you do not have a problem with your stomach may be helpful as far as decreasing inflammation.  I understand you are taking your tramadol as needed.  Further recommendations pending x-ray results. Acute Knee Pain, Adult Acute knee pain is sudden and may be caused by damage, swelling, or irritation of the muscles and tissues that support your knee. The injury may result from:  A fall.  An injury to your knee from twisting motions.  A hit to the knee.  Infection. Acute knee pain may go away on its own with time and rest. If it does not, your health care provider may order tests to find the cause of the pain. These may include:  Imaging tests, such as an X-ray, MRI, or ultrasound.  Joint aspiration. In this test, fluid is removed from the knee.  Arthroscopy. In this test, a lighted tube is inserted into the knee and an image is projected onto a TV screen.  Biopsy. In this test, a sample of tissue is removed from the body and studied under a microscope. Follow these instructions at home: Pay attention to any changes in your symptoms. Take these actions to relieve your pain. If you have a knee sleeve or brace:   Wear the sleeve or brace as told by your health care provider. Remove it only as told by your health care provider.  Loosen the sleeve or brace if your toes tingle, become numb, or turn cold and blue.  Keep the sleeve or brace clean.  If the sleeve or brace is not waterproof: ? Do not let it get wet. ? Cover it with a watertight covering when you take a bath or shower. Activity  Rest your knee.  Do not do things that cause pain or make pain worse.  Avoid high-impact activities or exercises, such as running, jumping rope, or doing jumping jacks.  Work with a physical therapist to make a safe exercise program,  as recommended by your health care provider. Do exercises as told by your physical therapist. Managing pain, stiffness, and swelling   If directed, put ice on the knee: ? Put ice in a plastic bag. ? Place a towel between your skin and the bag. ? Leave the ice on for 20 minutes, 2-3 times a day.  If directed, use an elastic bandage to put pressure (compression) on your injured knee. This may control swelling, give support, and help with discomfort. General instructions  Take over-the-counter and prescription medicines only as told by your health care provider.  Raise (elevate) your knee above the level of your heart when you are sitting or lying down.  Sleep with a pillow under your knee.  Do not use any products that contain nicotine or tobacco, such as cigarettes, e-cigarettes, and chewing tobacco. These can delay healing. If you need help quitting, ask your health care provider.  If you are overweight, work with your health care provider and a dietitian to set a weight-loss goal that is healthy and reasonable for you. Extra weight can put pressure on your knee.  Keep all follow-up visits as told by your health care provider. This is important. Contact a health care provider if:  Your knee pain continues, changes, or gets worse.  You have a fever along with knee pain.  Your knee feels warm  to the touch.  Your knee buckles or locks up. Get help right away if:  Your knee swells, and the swelling becomes worse.  You cannot move your knee.  You have severe pain in your knee. Summary  Acute knee pain can be caused by a fall, an injury, an infection, or damage, swelling, or irritation of the tissues that support your knee.  Your health care provider may perform tests to find out the cause of the pain.  Pay attention to any changes in your symptoms. Relieve your pain with rest, medicines, light activity, and use of ice.  Get help if your pain continues or becomes worse, your  knee swells, or you cannot move your knee. This information is not intended to replace advice given to you by your health care provider. Make sure you discuss any questions you have with your health care provider. Document Revised: 07/19/2017 Document Reviewed: 07/19/2017 Elsevier Patient Education  2020 [employer].

## 2019-09-02 NOTE — Progress Notes (Signed)
Established Patient Office Visit  Subjective:  Patient ID: Jeff [name], male    DOB: [birth year]  Age: 60 y.o. MRN: [id number]  CC:  Chief Complaint  Patient presents with  . Acute Visit    right knee pain    HPI Jeff [name] is a 59 yo who presents for knee right knee pain and swelling after a fall.  He got up in the middle the night on Saturday and was half asleep and tripped on a towel on his way to the bathroom.  He landed on his right knee and it was bent inward a little.  He laid there for a few seconds but was able to get up and walk on it with limping.  He had pain in the inside of his right knee originally and then swelling on outer side and into the leg bone. It was really swollen on Sunday. He elevated and placed ice and stayed off of it. Over the next few days, the pain and swelling have improved. Yesterday, it was tight under the knee- applied ice on the top and bottom of the knee. He took tramadol  x 4 on Sun. Normally he takes 2 tramadol per day chronically. Works for [employer]- in the building- a lot of pushing and pulling loaded carts. No NSAID use. He limping on it.   Past Medical History:  Diagnosis Date  . Arthritis    KNEES  . Cataract    REMOVED BILATERAL  . Claustrophobia   . GERD (gastroesophageal reflux disease)    with certain foods  . Glaucoma (increased eye pressure)   . Headache(784.0)    07/11/13- none in a long time  . Hepatic steatosis   . Hyperglycemia   . Hyperlipidemia   . Hypertension    no meds  . Sleep apnea     Past Surgical History:  Procedure Laterality Date  . CATARACT EXTRACTION W/PHACO Left 07/16/2013   Procedure: CATARACT EXTRACTION PHACO AND INTRAOCULAR LENS PLACEMENT (IOC) LEFT EYE;  Surgeon: Chalmers Guest, MD;  Location: [facility] OR;  Service: Ophthalmology;  Laterality: Left;  . CATARACT EXTRACTION W/PHACO Right 10/29/2013   Procedure: CATARACT EXTRACTION PHACO AND INTRAOCULAR LENS PLACEMENT (IOC);  Surgeon: Chalmers Guest, MD;   Location: [facility] OR;  Service: Ophthalmology;  Laterality: Right;  . EYE SURGERY     2 on left eye  . FINGER SURGERY Right   . MINI SHUNT INSERTION  03/09/2011   Procedure: INSERTION OF MINI SHUNT;  Surgeon: Chalmers Guest, MD;  Location: [facility] OR;  Service: Ophthalmology;  Laterality: Left;    Family History  Problem Relation Age of Onset  . Asthma Mother   . Hyperlipidemia Mother   . Heart disease Mother   . Hypertension Mother   . Liver cancer Mother   . Thyroid disease Mother   . Asthma Father   . Hyperlipidemia Father   . Heart disease Father   . Stroke Father   . Hypertension Father   . Aneurysm Father   . Aneurysm Brother        non smoker   . Early death Brother   . Asthma Paternal Aunt   . Heart disease Sister   . Early death Sister   . Migraines Neg Hx     Social History   Socioeconomic History  . Marital status: Divorced    Spouse name: Not on file  . Number of children: 0  . Years of education: college3  . Highest education level: Not  on file  Occupational History  . Occupation: Aeronautical engineer: [employer]  Tobacco Use  . Smoking status: Never Smoker  . Smokeless tobacco: Never Used  Vaping Use  . Vaping Use: Never used  Substance and Sexual Activity  . Alcohol use: Yes    Alcohol/week: 2.0 standard drinks    Types: 2 Cans of beer per week    Comment: daily  . Drug use: No  . Sexual activity: Not on file  Other Topics Concern  . Not on file  Social History Narrative  . Not on file   Social Determinants of Health   Financial Resource Strain:   . Difficulty of Paying Living Expenses:   Food Insecurity:   . Worried About Programme researcher, broadcasting/film/video in the Last Year:   . Barista in the Last Year:   Transportation Needs:   . Freight forwarder (Medical):   Marland Kitchen Lack of Transportation (Non-Medical):   Physical Activity:   . Days of Exercise per Week:   . Minutes of Exercise per Session:   Stress:   . Feeling of Stress :   Social Connections:   .  Frequency of Communication with Friends and Family:   . Frequency of Social Gatherings with Friends and Family:   . Attends Religious Services:   . Active Member of Clubs or Organizations:   . Attends Banker Meetings:   Marland Kitchen Marital Status:   Intimate Partner Violence:   . Fear of Current or Ex-Partner:   . Emotionally Abused:   Marland Kitchen Physically Abused:   . Sexually Abused:     Outpatient Medications Prior to Visit  Medication Sig Dispense Refill  . colchicine 0.6 MG tablet TAKE HOURLY UNTIL GOUT IMPROVED/DIARRHEA, ON FIRST DAY OF TREATMENT.TAKE DAILY THEREAFTER AS NEEDED 270 tablet 1  . dorzolamide-timolol (COSOPT) 22.3-6.8 MG/ML ophthalmic solution PLACE 1 DROP INTO THE RIGHT EYE 2 (TWO) TIMES DAILY.  11  . hydrochlorothiazide (HYDRODIURIL) 25 MG tablet TAKE 1 TABLET BY MOUTH EVERY DAY 90 tablet 3  . losartan (COZAAR) 100 MG tablet TAKE 1 TABLET BY MOUTH EVERY DAY 90 tablet 3  . metoprolol succinate (TOPROL-XL) 25 MG 24 hr tablet TAKE 1 TABLET BY MOUTH EVERY DAY 90 tablet 1  . mometasone (NASONEX) 50 MCG/ACT nasal spray SPRAY 2 SPRAYS INTO EACH NOSTRIL EVERY DAY    . pantoprazole (PROTONIX) 40 MG tablet TAKE 1 TABLET BY MOUTH TWICE A DAY 180 tablet 1  . prednisoLONE acetate (PRED FORTE) 1 % ophthalmic suspension INSTILL 1 DROP IN BOTH EYES 4 TIMES DAILY    . rOPINIRole (REQUIP) 0.5 MG tablet Take 1 tablet (0.5 mg total) by mouth at bedtime. (Patient taking differently: Take 0.5 mg by mouth as needed. ) 90 tablet 1  . rosuvastatin (CRESTOR) 40 MG tablet Take 1 tablet (40 mg total) by mouth daily. 90 tablet 0  . sildenafil (REVATIO) 20 MG tablet Take 1 tablet (20 mg total) by mouth 3 (three) times daily. 30 tablet 0  . silodosin (RAPAFLO) 8 MG CAPS capsule TAKE 1 CAPSULE BY MOUTH EVERYDAY AT BEDTIME  11  . traMADol (ULTRAM) 50 MG tablet Take 1 tablet (50 mg total) by mouth every 6 (six) hours as needed. for pain 120 tablet 2  . traZODone (DESYREL) 50 MG tablet TAKE 1 TABLET BY  MOUTH EVERY DAY AS NEEDED FOR SLEEP 90 tablet 1  . lidocaine (XYLOCAINE) 1 % (with pres) injection 4 mL  No facility-administered medications prior to visit.    Allergies  Allergen Reactions  . Keflex [Cephalexin] Rash   Review of Systems  Constitutional: Negative for chills and fever.  HENT: Negative.   Respiratory: Negative.   Cardiovascular: Negative.   Gastrointestinal: Negative.   Genitourinary: Negative.   Musculoskeletal: Positive for arthralgias, gait problem and joint swelling. Negative for myalgias, neck pain and neck stiffness.  Skin: Negative for rash and wound.  Neurological: Negative for dizziness, seizures, syncope and headaches.      Objective:    Physical Exam Vitals reviewed.  HENT:     Head: Normocephalic.  Musculoskeletal:        General: Swelling and tenderness present.     Comments: Rt knee with tenderness medial knee and tibia and slight swelling, no bruising.  No bony deformity.  Able to fully extend and flex.  Walking with a limp.  Skin:    General: Skin is warm.  Neurological:     General: No focal deficit present.     Mental Status: He is alert and oriented to person, place, and time.  Psychiatric:        Mood and Affect: Mood normal.        Behavior: Behavior normal.    BP (!) 130/98 (BP Location: Left Arm, Patient Position: Sitting, Cuff Size: Normal)   Pulse 83   Temp 97.7 F (36.5 C) (Oral)   Ht 5\' 11"  (1.803 m)   Wt 193 lb (87.5 kg)   SpO2 97%   BMI 26.92 kg/m  Wt Readings from Last 3 Encounters:  09/02/19 193 lb (87.5 kg)  07/25/19 192 lb 6.4 oz (87.3 kg)  07/23/19 192 lb 3.2 oz (87.2 kg)     There are no preventive care reminders to display for this patient.  There are no preventive care reminders to display for this patient.  Lab Results  Component Value Date   TSH 2.04 07/13/2017   Lab Results  Component Value Date   WBC 5.3 03/01/2018   HGB 14.5 03/01/2018   HCT 42.5 03/01/2018   MCV 93.8 03/01/2018   PLT  203 03/01/2018   Lab Results  Component Value Date   NA 138 07/23/2019   K 4.8 07/23/2019   CO2 27 07/23/2019   GLUCOSE 121 (H) 07/23/2019   BUN 17 07/23/2019   CREATININE 0.90 07/23/2019   BILITOT 0.6 07/23/2019   ALKPHOS 79 07/23/2019   AST 54 (H) 07/23/2019   ALT 65 (H) 07/23/2019   PROT 6.9 07/23/2019   ALBUMIN 4.3 07/23/2019   CALCIUM 9.6 07/23/2019   ANIONGAP 7 03/01/2018   GFR 81.29 12/28/2017   Lab Results  Component Value Date   CHOL 179 07/23/2019   Lab Results  Component Value Date   HDL 68 07/23/2019   Lab Results  Component Value Date   LDLCALC 85 07/23/2019   Lab Results  Component Value Date   TRIG 151 (H) 07/23/2019   Lab Results  Component Value Date   CHOLHDL 2.6 07/23/2019   Lab Results  Component Value Date   HGBA1C 5.8 (H) 07/23/2019   CLINICAL DATA:  Slip and fall on knee with pain and swelling. Slowly improving, tenderness to medial knee and tibia.  EXAM: RIGHT KNEE - COMPLETE 4+ VIEW  COMPARISON:  None.  FINDINGS: No evidence of fracture or dislocation. There is a lucent lesion in the proximal lateral tibia measuring 3.7 x 2.1 cm with peripherally sclerotic margins and a narrow zone of transition. This  involves the metaphysis, spans the physis and extends nearly to the articular surface. There may be coarse internal trabecula. Mild peripheral spurring of the medial tibiofemoral and patellofemoral compartments. There is a small knee joint effusion. Soft tissues are unremarkable.  IMPRESSION: 1. No fracture or dislocation of the right knee. 2. Mild osteoarthritis. Small knee joint effusion. 3. Lucent lesion in the proximal lateral tibia measuring 3.7 x 2.1 cm with peripherally sclerotic margins and a narrow zone of transition. This is most likely a non aggressive lesion, however consider further evaluation with MRI for complete characterization.   Electronically Signed   By: Narda Rutherford M.D.   On: 09/02/2019  23:50    Assessment & Plan:   Problem List Items Addressed This Visit    None    Visit Diagnoses    Acute pain of right knee    -  Primary   Relevant Orders   DG Knee Complete 4 Views Right (Completed)      No orders of the defined types were placed in this encounter. Knee pain and swelling after fall.  X-ray report as noted above.  Referral placed to orthopedics.  To the Xray today.  Continue with rest, ice, elevation, I think a knee sleeve may be more comfortable for you.  Ibuprofen if you do not have a problem with your stomach may be helpful as far as decreasing inflammation.  I understand you are taking your tramadol as needed.  Further recommendations pending x-ray results.  Follow-up: Return if symptoms worsen or fail to improve.   This visit occurred during the SARS-CoV-2 public health emergency.  Safety protocols were in place, including screening questions prior to the visit, additional usage of staff PPE, and extensive cleaning of exam room while observing appropriate contact time as indicated for disinfecting solutions.   Amedeo Kinsman, NP

## 2019-09-03 ENCOUNTER — Encounter: Payer: Self-pay | Admitting: Nurse Practitioner

## 2019-09-04 ENCOUNTER — Telehealth: Payer: Self-pay | Admitting: Internal Medicine

## 2019-09-04 NOTE — Telephone Encounter (Signed)
[email or web address]

## 2019-09-04 NOTE — Telephone Encounter (Signed)
Patient returning office phone for results.

## 2019-09-04 NOTE — Telephone Encounter (Signed)
Pt called to check on x-ray results from visit on Tuesday 09-02-19

## 2019-09-04 NOTE — Telephone Encounter (Signed)
Patient aware of results.

## 2019-09-04 NOTE — Telephone Encounter (Signed)
See result note.  

## 2019-09-27 DIAGNOSIS — M899 Disorder of bone, unspecified: Secondary | ICD-10-CM | POA: Insufficient documentation

## 2019-09-27 NOTE — Progress Notes (Deleted)
[facility]  [phone] [phone]) [phone]  ID: Jeff [name] OB: [birth year]  MR#: [id number]  VEH#:[id number]  Patient Care Team: Sherlene Shams, MD as PCP - General (Internal Medicine) Jens Som Madolyn Frieze, MD as PCP - Cardiology (Cardiology) Chalmers Guest, MD as Consulting Physician (Ophthalmology)  CHIEF COMPLAINT: Bone lesion.  INTERVAL HISTORY: ***  REVIEW OF SYSTEMS:   ROS  As per HPI. Otherwise, a complete review of systems is negative.  PAST MEDICAL HISTORY: Past Medical History:  Diagnosis Date  . Arthritis    KNEES  . Cataract    REMOVED BILATERAL  . Claustrophobia   . GERD (gastroesophageal reflux disease)    with certain foods  . Glaucoma (increased eye pressure)   . Headache(784.0)    07/11/13- none in a long time  . Hepatic steatosis   . Hyperglycemia   . Hyperlipidemia   . Hypertension    no meds  . Sleep apnea     PAST SURGICAL HISTORY: Past Surgical History:  Procedure Laterality Date  . CATARACT EXTRACTION W/PHACO Left 07/16/2013   Procedure: CATARACT EXTRACTION PHACO AND INTRAOCULAR LENS PLACEMENT (IOC) LEFT EYE;  Surgeon: Chalmers Guest, MD;  Location: [facility] OR;  Service: Ophthalmology;  Laterality: Left;  . CATARACT EXTRACTION W/PHACO Right 10/29/2013   Procedure: CATARACT EXTRACTION PHACO AND INTRAOCULAR LENS PLACEMENT (IOC);  Surgeon: Chalmers Guest, MD;  Location: [facility] OR;  Service: Ophthalmology;  Laterality: Right;  . EYE SURGERY     2 on left eye  . FINGER SURGERY Right   . MINI SHUNT INSERTION  03/09/2011   Procedure: INSERTION OF MINI SHUNT;  Surgeon: Chalmers Guest, MD;  Location: [facility] OR;  Service: Ophthalmology;  Laterality: Left;    FAMILY HISTORY: Family History  Problem Relation Age of Onset  . Asthma Mother   . Hyperlipidemia Mother   . Heart disease Mother   . Hypertension Mother   . Liver cancer Mother   . Thyroid disease Mother   . Asthma Father   . Hyperlipidemia Father   . Heart disease Father    . Stroke Father   . Hypertension Father   . Aneurysm Father   . Aneurysm Brother        non smoker   . Early death Brother   . Asthma Paternal Aunt   . Heart disease Sister   . Early death Sister   . Migraines Neg Hx     ADVANCED DIRECTIVES (Y/N):  N  HEALTH MAINTENANCE: Social History   Tobacco Use  . Smoking status: Never Smoker  . Smokeless tobacco: Never Used  Vaping Use  . Vaping Use: Never used  Substance Use Topics  . Alcohol use: Yes    Alcohol/week: 2.0 standard drinks    Types: 2 Cans of beer per week    Comment: daily  . Drug use: No     Colonoscopy:  PAP:  Bone density:  Lipid panel:  Allergies  Allergen Reactions  . Keflex [Cephalexin] Rash    Current Outpatient Medications  Medication Sig Dispense Refill  . colchicine 0.6 MG tablet TAKE HOURLY UNTIL GOUT IMPROVED/DIARRHEA, ON FIRST DAY OF TREATMENT.TAKE DAILY THEREAFTER AS NEEDED 270 tablet 1  . dorzolamide-timolol (COSOPT) 22.3-6.8 MG/ML ophthalmic solution PLACE 1 DROP INTO THE RIGHT EYE 2 (TWO) TIMES DAILY.  11  . hydrochlorothiazide (HYDRODIURIL) 25 MG tablet TAKE 1 TABLET BY MOUTH EVERY DAY 90 tablet 3  . losartan (COZAAR) 100 MG tablet TAKE 1 TABLET BY MOUTH EVERY DAY 90 tablet  3  . metoprolol succinate (TOPROL-XL) 25 MG 24 hr tablet TAKE 1 TABLET BY MOUTH EVERY DAY 90 tablet 1  . mometasone (NASONEX) 50 MCG/ACT nasal spray SPRAY 2 SPRAYS INTO EACH NOSTRIL EVERY DAY    . pantoprazole (PROTONIX) 40 MG tablet TAKE 1 TABLET BY MOUTH TWICE A DAY 180 tablet 1  . prednisoLONE acetate (PRED FORTE) 1 % ophthalmic suspension INSTILL 1 DROP IN BOTH EYES 4 TIMES DAILY    . rOPINIRole (REQUIP) 0.5 MG tablet Take 1 tablet (0.5 mg total) by mouth at bedtime. (Patient taking differently: Take 0.5 mg by mouth as needed. ) 90 tablet 1  . rosuvastatin (CRESTOR) 40 MG tablet Take 1 tablet (40 mg total) by mouth daily. 90 tablet 0  . sildenafil (REVATIO) 20 MG tablet Take 1 tablet (20 mg total) by mouth 3 (three)  times daily. 30 tablet 0  . silodosin (RAPAFLO) 8 MG CAPS capsule TAKE 1 CAPSULE BY MOUTH EVERYDAY AT BEDTIME  11  . traMADol (ULTRAM) 50 MG tablet Take 1 tablet (50 mg total) by mouth every 6 (six) hours as needed. for pain 120 tablet 2  . traZODone (DESYREL) 50 MG tablet TAKE 1 TABLET BY MOUTH EVERY DAY AS NEEDED FOR SLEEP 90 tablet 1   No current facility-administered medications for this visit.    OBJECTIVE: There were no vitals filed for this visit.   There is no height or weight on file to calculate BMI.    ECOG FS:{CHL ONC [id number]  General: Well-developed, well-nourished, no acute distress. Eyes: Pink conjunctiva, anicteric sclera. HEENT: Normocephalic, moist mucous membranes. Lungs: No audible wheezing or coughing. Heart: Regular rate and rhythm. Abdomen: Soft, nontender, no obvious distention. Musculoskeletal: No edema, cyanosis, or clubbing. Neuro: Alert, answering all questions appropriately. Cranial nerves grossly intact. Skin: No rashes or petechiae noted. Psych: Normal affect. Lymphatics: No cervical, calvicular, axillary or inguinal LAD.   LAB RESULTS:  Lab Results  Component Value Date   NA 138 07/23/2019   K 4.8 07/23/2019   CL 97 07/23/2019   CO2 27 07/23/2019   GLUCOSE 121 (H) 07/23/2019   BUN 17 07/23/2019   CREATININE 0.90 07/23/2019   CALCIUM 9.6 07/23/2019   PROT 6.9 07/23/2019   ALBUMIN 4.3 07/23/2019   AST 54 (H) 07/23/2019   ALT 65 (H) 07/23/2019   ALKPHOS 79 07/23/2019   BILITOT 0.6 07/23/2019   GFRNONAA 94 07/23/2019   GFRAA 109 07/23/2019    Lab Results  Component Value Date   WBC 5.3 03/01/2018   NEUTROABS 3.1 03/01/2018   HGB 14.5 03/01/2018   HCT 42.5 03/01/2018   MCV 93.8 03/01/2018   PLT 203 03/01/2018     STUDIES: DG Knee Complete 4 Views Right  Result Date: 09/02/2019 CLINICAL DATA:  Slip and fall on knee with pain and swelling. Slowly improving, tenderness to medial knee and tibia. EXAM: RIGHT KNEE - COMPLETE 4+  VIEW COMPARISON:  None. FINDINGS: No evidence of fracture or dislocation. There is a lucent lesion in the proximal lateral tibia measuring 3.7 x 2.1 cm with peripherally sclerotic margins and a narrow zone of transition. This involves the metaphysis, spans the physis and extends nearly to the articular surface. There may be coarse internal trabecula. Mild peripheral spurring of the medial tibiofemoral and patellofemoral compartments. There is a small knee joint effusion. Soft tissues are unremarkable. IMPRESSION: 1. No fracture or dislocation of the right knee. 2. Mild osteoarthritis. Small knee joint effusion. 3. Lucent lesion in the proximal lateral  tibia measuring 3.7 x 2.1 cm with peripherally sclerotic margins and a narrow zone of transition. This is most likely a nonaggressive lesion, however consider further evaluation with MRI for complete characterization. Electronically Signed   By: Narda Rutherford M.D.   On: 09/02/2019 23:50    ASSESSMENT: Bone lesion.  PLAN:   1. Bone lesion:   Patient expressed understanding and was in agreement with this plan. He also understands that He can call clinic at any time with any questions, concerns, or complaints.   Cancer Staging No matching staging information was found for the patient.  Jeralyn Ruths, MD   09/27/2019 1:59 PM

## 2019-10-03 ENCOUNTER — Inpatient Hospital Stay: Payer: BC Managed Care – PPO | Admitting: Oncology

## 2019-10-03 ENCOUNTER — Inpatient Hospital Stay: Payer: BC Managed Care – PPO

## 2019-10-06 ENCOUNTER — Other Ambulatory Visit: Payer: Self-pay | Admitting: Cardiology

## 2019-10-06 DIAGNOSIS — E7800 Pure hypercholesterolemia, unspecified: Secondary | ICD-10-CM

## 2019-10-10 NOTE — Progress Notes (Deleted)
[facility]  [phone] [phone]  ID: Jeff [name] OB: 1960-12-01  MR#: [id number]  SEG#:[id number]  Patient Care Team: Sherlene Shams, MD as PCP - General (Internal Medicine) Jens Som Madolyn Frieze, MD as PCP - Cardiology (Cardiology) Chalmers Guest, MD as Consulting Physician (Ophthalmology)  CHIEF COMPLAINT: Bone lesion.  INTERVAL HISTORY: ***  REVIEW OF SYSTEMS:   ROS  As per HPI. Otherwise, a complete review of systems is negative.  PAST MEDICAL HISTORY: Past Medical History:  Diagnosis Date  . Arthritis    KNEES  . Cataract    REMOVED BILATERAL  . Claustrophobia   . GERD (gastroesophageal reflux disease)    with certain foods  . Glaucoma (increased eye pressure)   . Headache(784.0)    07/11/13- none in a long time  . Hepatic steatosis   . Hyperglycemia   . Hyperlipidemia   . Hypertension    no meds  . Sleep apnea     PAST SURGICAL HISTORY: Past Surgical History:  Procedure Laterality Date  . CATARACT EXTRACTION W/PHACO Left 07/16/2013   Procedure: CATARACT EXTRACTION PHACO AND INTRAOCULAR LENS PLACEMENT (IOC) LEFT EYE;  Surgeon: Chalmers Guest, MD;  Location: [facility] OR;  Service: Ophthalmology;  Laterality: Left;  . CATARACT EXTRACTION W/PHACO Right 10/29/2013   Procedure: CATARACT EXTRACTION PHACO AND INTRAOCULAR LENS PLACEMENT (IOC);  Surgeon: Chalmers Guest, MD;  Location: [facility] OR;  Service: Ophthalmology;  Laterality: Right;  . EYE SURGERY     2 on left eye  . FINGER SURGERY Right   . MINI SHUNT INSERTION  03/09/2011   Procedure: INSERTION OF MINI SHUNT;  Surgeon: Chalmers Guest, MD;  Location: [facility] OR;  Service: Ophthalmology;  Laterality: Left;    FAMILY HISTORY: Family History  Problem Relation Age of Onset  . Asthma Mother   . Hyperlipidemia Mother   . Heart disease Mother   . Hypertension Mother   . Liver cancer Mother   . Thyroid disease Mother   . Asthma Father   . Hyperlipidemia Father   . Heart disease Father    . Stroke Father   . Hypertension Father   . Aneurysm Father   . Aneurysm Brother        non smoker   . Early death Brother   . Asthma Paternal Aunt   . Heart disease Sister   . Early death Sister   . Migraines Neg Hx     ADVANCED DIRECTIVES (Y/N):  N  HEALTH MAINTENANCE: Social History   Tobacco Use  . Smoking status: Never Smoker  . Smokeless tobacco: Never Used  Vaping Use  . Vaping Use: Never used  Substance Use Topics  . Alcohol use: Yes    Alcohol/week: 2.0 standard drinks    Types: 2 Cans of beer per week    Comment: daily  . Drug use: No     Colonoscopy:  PAP:  Bone density:  Lipid panel:  Allergies  Allergen Reactions  . Keflex [Cephalexin] Rash    Current Outpatient Medications  Medication Sig Dispense Refill  . colchicine 0.6 MG tablet TAKE HOURLY UNTIL GOUT IMPROVED/DIARRHEA, ON FIRST DAY OF TREATMENT.TAKE DAILY THEREAFTER AS NEEDED 270 tablet 1  . dorzolamide-timolol (COSOPT) 22.3-6.8 MG/ML ophthalmic solution PLACE 1 DROP INTO THE RIGHT EYE 2 (TWO) TIMES DAILY.  11  . hydrochlorothiazide (HYDRODIURIL) 25 MG tablet TAKE 1 TABLET BY MOUTH EVERY DAY 90 tablet 3  . losartan (COZAAR) 100 MG tablet TAKE 1 TABLET BY MOUTH EVERY DAY 90 tablet  3  . metoprolol succinate (TOPROL-XL) 25 MG 24 hr tablet TAKE 1 TABLET BY MOUTH EVERY DAY 90 tablet 1  . mometasone (NASONEX) 50 MCG/ACT nasal spray SPRAY 2 SPRAYS INTO EACH NOSTRIL EVERY DAY    . pantoprazole (PROTONIX) 40 MG tablet TAKE 1 TABLET BY MOUTH TWICE A DAY 180 tablet 1  . prednisoLONE acetate (PRED FORTE) 1 % ophthalmic suspension INSTILL 1 DROP IN BOTH EYES 4 TIMES DAILY    . rOPINIRole (REQUIP) 0.5 MG tablet Take 1 tablet (0.5 mg total) by mouth at bedtime. (Patient taking differently: Take 0.5 mg by mouth as needed. ) 90 tablet 1  . rosuvastatin (CRESTOR) 40 MG tablet TAKE 1 TABLET BY MOUTH EVERY DAY 90 tablet 3  . sildenafil (REVATIO) 20 MG tablet Take 1 tablet (20 mg total) by mouth 3 (three) times  daily. 30 tablet 0  . silodosin (RAPAFLO) 8 MG CAPS capsule TAKE 1 CAPSULE BY MOUTH EVERYDAY AT BEDTIME  11  . traMADol (ULTRAM) 50 MG tablet Take 1 tablet (50 mg total) by mouth every 6 (six) hours as needed. for pain 120 tablet 2  . traZODone (DESYREL) 50 MG tablet TAKE 1 TABLET BY MOUTH EVERY DAY AS NEEDED FOR SLEEP 90 tablet 1   No current facility-administered medications for this visit.    OBJECTIVE: There were no vitals filed for this visit.   There is no height or weight on file to calculate BMI.    ECOG FS:{CHL ONC [id number]  General: Well-developed, well-nourished, no acute distress. Eyes: Pink conjunctiva, anicteric sclera. HEENT: Normocephalic, moist mucous membranes. Lungs: No audible wheezing or coughing. Heart: Regular rate and rhythm. Abdomen: Soft, nontender, no obvious distention. Musculoskeletal: No edema, cyanosis, or clubbing. Neuro: Alert, answering all questions appropriately. Cranial nerves grossly intact. Skin: No rashes or petechiae noted. Psych: Normal affect. Lymphatics: No cervical, calvicular, axillary or inguinal LAD.   LAB RESULTS:  Lab Results  Component Value Date   NA 138 07/23/2019   K 4.8 07/23/2019   CL 97 07/23/2019   CO2 27 07/23/2019   GLUCOSE 121 (H) 07/23/2019   BUN 17 07/23/2019   CREATININE 0.90 07/23/2019   CALCIUM 9.6 07/23/2019   PROT 6.9 07/23/2019   ALBUMIN 4.3 07/23/2019   AST 54 (H) 07/23/2019   ALT 65 (H) 07/23/2019   ALKPHOS 79 07/23/2019   BILITOT 0.6 07/23/2019   GFRNONAA 94 07/23/2019   GFRAA 109 07/23/2019    Lab Results  Component Value Date   WBC 5.3 03/01/2018   NEUTROABS 3.1 03/01/2018   HGB 14.5 03/01/2018   HCT 42.5 03/01/2018   MCV 93.8 03/01/2018   PLT 203 03/01/2018     STUDIES: No results found.  ASSESSMENT: Bone lesion.  PLAN:    1. Bone lesion:  Patient expressed understanding and was in agreement with this plan. He also understands that He can call clinic at any time with any  questions, concerns, or complaints.   Cancer Staging No matching staging information was found for the patient.  Jeralyn Ruths, MD   10/10/2019 1:03 PM

## 2019-10-17 ENCOUNTER — Inpatient Hospital Stay: Payer: BC Managed Care – PPO | Admitting: Oncology

## 2019-10-17 ENCOUNTER — Inpatient Hospital Stay: Payer: BC Managed Care – PPO

## 2019-10-17 DIAGNOSIS — M899 Disorder of bone, unspecified: Secondary | ICD-10-CM

## 2019-10-30 ENCOUNTER — Other Ambulatory Visit: Payer: Self-pay | Admitting: Internal Medicine

## 2019-12-19 ENCOUNTER — Other Ambulatory Visit: Payer: Self-pay | Admitting: Internal Medicine

## 2019-12-26 ENCOUNTER — Ambulatory Visit: Payer: BC Managed Care – PPO | Admitting: Internal Medicine

## 2019-12-27 ENCOUNTER — Other Ambulatory Visit: Payer: Self-pay | Admitting: Internal Medicine

## 2019-12-29 NOTE — Telephone Encounter (Signed)
Refill request for tramadol, last seen 07-25-19, last filled 07-25-19.  Please advise.

## 2020-01-29 ENCOUNTER — Telehealth: Payer: Self-pay

## 2020-01-29 DIAGNOSIS — M545 Low back pain, unspecified: Secondary | ICD-10-CM

## 2020-01-29 DIAGNOSIS — M542 Cervicalgia: Secondary | ICD-10-CM

## 2020-01-29 NOTE — Telephone Encounter (Signed)
Triage please.
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
## 2020-01-30 ENCOUNTER — Ambulatory Visit: Payer: BC Managed Care – PPO | Admitting: Internal Medicine

## 2020-02-06 NOTE — Addendum Note (Signed)
Addended by: Birdie Sons Other Atienza G on: 02/06/2020 12:49 PM   Modules accepted: Orders

## 2020-02-06 NOTE — Telephone Encounter (Signed)
Left message for patient to return call back.   Patient needs a follow up appointment, and to let know Dr Birdie Sons has placed xrays.

## 2020-02-06 NOTE — Telephone Encounter (Signed)
He really should have an in person visit to evaluate this to help determine a specific source of his symptoms. I can order x-rays though he will need to be seen as well.
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
## 2020-02-09 ENCOUNTER — Ambulatory Visit (INDEPENDENT_AMBULATORY_CARE_PROVIDER_SITE_OTHER): Payer: BC Managed Care – PPO

## 2020-02-09 ENCOUNTER — Encounter: Payer: Self-pay | Admitting: Family

## 2020-02-09 ENCOUNTER — Ambulatory Visit (INDEPENDENT_AMBULATORY_CARE_PROVIDER_SITE_OTHER): Payer: BC Managed Care – PPO | Admitting: Family

## 2020-02-09 ENCOUNTER — Other Ambulatory Visit: Payer: Self-pay

## 2020-02-09 VITALS — BP 112/78 | HR 90 | Temp 98.1°F | Ht 71.0 in | Wt 202.4 lb

## 2020-02-09 DIAGNOSIS — M542 Cervicalgia: Secondary | ICD-10-CM | POA: Diagnosis not present

## 2020-02-09 MED ORDER — MELOXICAM 7.5 MG PO TABS: 7.5000 mg | ORAL_TABLET | Freq: Every day | ORAL | 1 refills | Status: DC | PRN
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
## 2020-02-09 NOTE — Patient Instructions (Addendum)
Please ensure you make a follow up with ophthalmologist.   Please start weaning off percocet. Once you are off, you may resume tramadol.   Please use heat and purchase salon pas pain patch which is over the counter.   Start mobic which is an anti inflammatory  A couple of points in regards to meloxicam ( Mobic) -  This medication is not intended for daily , long term use. It is a potent anti inflammatory ( NSAID), and my intention is for you take as needed for moderate to severe pain. If you find yourself using daily, please let me know.   Please takes Mobic ( meloxicam) with FOOD since it is an anti-inflammatory as it can cause a GI bleed or ulcer. If you have a history of GI bleed or ulcer, please do NOT take.  Do no take over the counter aleve, motrin, advil, goody's powder for pain as they are also NSAIDs, and they are  in the same class as Mobic  Lastly, we will need to monitor kidney function while on Mobic, and if we were to see any decline in kidney function in the future, we would have to discontinue this medication.

## 2020-02-10 NOTE — Telephone Encounter (Signed)
Pt was seen by Rennie Plowman, NP yesterday.

## 2020-02-11 NOTE — Assessment & Plan Note (Addendum)
Acute on chronic cervical and lumbar pain. DDD evident on xrays and patient in agreement with consult with Dr Yves Dill for management. Advised mobic 7.5mg  prn and to wean of percocet as prescribed by orthopedics.

## 2020-02-26 ENCOUNTER — Other Ambulatory Visit: Payer: Self-pay | Admitting: Internal Medicine

## 2020-03-26 ENCOUNTER — Ambulatory Visit: Payer: BC Managed Care – PPO | Admitting: Internal Medicine

## 2020-03-26 ENCOUNTER — Encounter: Payer: Self-pay | Admitting: Internal Medicine

## 2020-03-26 ENCOUNTER — Telehealth (INDEPENDENT_AMBULATORY_CARE_PROVIDER_SITE_OTHER): Payer: BC Managed Care – PPO | Admitting: Internal Medicine

## 2020-03-26 VITALS — Ht 70.98 in | Wt 202.0 lb

## 2020-03-26 DIAGNOSIS — Z9189 Other specified personal risk factors, not elsewhere classified: Secondary | ICD-10-CM | POA: Diagnosis not present

## 2020-03-26 DIAGNOSIS — K760 Fatty (change of) liver, not elsewhere classified: Secondary | ICD-10-CM

## 2020-03-26 DIAGNOSIS — M25512 Pain in left shoulder: Secondary | ICD-10-CM

## 2020-03-26 DIAGNOSIS — E119 Type 2 diabetes mellitus without complications: Secondary | ICD-10-CM

## 2020-03-26 DIAGNOSIS — M130 Polyarthritis, unspecified: Secondary | ICD-10-CM

## 2020-03-26 DIAGNOSIS — G8929 Other chronic pain: Secondary | ICD-10-CM

## 2020-03-26 MED ORDER — TRAMADOL HCL 50 MG PO TABS: 50.0000 mg | ORAL_TABLET | Freq: Four times a day (QID) | ORAL | 0 refills | Status: DC | PRN
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
## 2020-03-28 NOTE — Assessment & Plan Note (Addendum)
Resume use of tramadol and tylenol  After finishing taking the oxycodone from his hand  surgery.  He may start on Sunday .   Refill history confirmed via [place] Controlled Substance databas, accessed by me today.. one month refill given until he can be seen.

## 2020-03-28 NOTE — Assessment & Plan Note (Addendum)
His  fasting  Glucose was elevated and  diagnostic of diabetes in Sept 2018 ,  But he  lowered his A1 to 5.6 with  healthy  lifestyle changes including Mediterranean diet , weight loss  and regular  exercise . Discussed the rise to 5.8 and recommended low GI diet and regular participation in exercise.    Lab Results  Component Value Date   HGBA1C 5.8 (H) 07/23/2019

## 2020-03-28 NOTE — Assessment & Plan Note (Signed)
Referredto [employer]

## 2020-03-29 ENCOUNTER — Telehealth: Payer: Self-pay | Admitting: Internal Medicine

## 2020-03-29 NOTE — Telephone Encounter (Signed)
Patient called in stated that he was not able to get transportation for Tuesday appointment so he cancelled tell Dr.Tullo thank her for all her help

## 2020-03-30 ENCOUNTER — Ambulatory Visit: Payer: BC Managed Care – PPO | Admitting: Internal Medicine

## 2020-03-31 ENCOUNTER — Ambulatory Visit: Payer: BC Managed Care – PPO | Admitting: Internal Medicine

## 2020-04-08 ENCOUNTER — Telehealth: Payer: Self-pay | Admitting: Internal Medicine

## 2020-04-08 NOTE — Telephone Encounter (Signed)
Rejection Reason - Patient Declined - Pt cancelled consult with Dr. Mariah Milling on 03/17/20." Jeff [name] said on Apr 08, 2020 10:35 AM  [facility] physiatry physical medicine and rehab

## 2020-04-13 ENCOUNTER — Other Ambulatory Visit: Payer: Self-pay | Admitting: Family

## 2020-04-13 DIAGNOSIS — M542 Cervicalgia: Secondary | ICD-10-CM

## 2020-05-06 ENCOUNTER — Other Ambulatory Visit: Payer: Self-pay | Admitting: Internal Medicine

## 2020-06-01 ENCOUNTER — Other Ambulatory Visit: Payer: Self-pay | Admitting: Internal Medicine

## 2020-06-01 NOTE — Telephone Encounter (Signed)
RX Refill:tramadol Last Seen:03-26-20 Last ordered:03-28-20

## 2020-08-30 ENCOUNTER — Other Ambulatory Visit: Payer: Self-pay | Admitting: Internal Medicine

## 2020-10-19 ENCOUNTER — Other Ambulatory Visit: Payer: Self-pay | Admitting: Internal Medicine

## 2020-10-20 NOTE — Telephone Encounter (Signed)
RX Refill:tramadol Last Seen:03-26-20 Last ordered:06-02-20

## 2020-10-26 ENCOUNTER — Telehealth: Payer: Self-pay

## 2020-10-26 NOTE — Telephone Encounter (Addendum)
RX Refill:tramadol Last Seen:03-26-20 Last ordered:06-02-20 Patient has in office appointment 11-19-20 @ 1430.  Patient would like enough until his appointment.

## 2020-10-27 MED ORDER — TRAMADOL HCL 50 MG PO TABS: 50.0000 mg | ORAL_TABLET | Freq: Two times a day (BID) | ORAL | 0 refills | Status: DC | PRN

## 2020-10-27 NOTE — Addendum Note (Signed)
Addended by: Sherlene Shams on: 10/27/2020 12:56 PM   Modules accepted: Orders

## 2020-10-27 NOTE — Telephone Encounter (Signed)
REfilled for thirty days

## 2020-11-19 ENCOUNTER — Telehealth: Payer: Self-pay | Admitting: Internal Medicine

## 2020-11-19 ENCOUNTER — Encounter: Payer: Self-pay | Admitting: Internal Medicine

## 2020-11-19 ENCOUNTER — Ambulatory Visit (INDEPENDENT_AMBULATORY_CARE_PROVIDER_SITE_OTHER): Payer: BC Managed Care – PPO | Admitting: Internal Medicine

## 2020-11-19 VITALS — Ht 70.0 in | Wt 202.0 lb

## 2020-11-19 DIAGNOSIS — F321 Major depressive disorder, single episode, moderate: Secondary | ICD-10-CM

## 2020-11-19 DIAGNOSIS — M130 Polyarthritis, unspecified: Secondary | ICD-10-CM | POA: Diagnosis not present

## 2020-11-19 DIAGNOSIS — K760 Fatty (change of) liver, not elsewhere classified: Secondary | ICD-10-CM

## 2020-11-19 DIAGNOSIS — M542 Cervicalgia: Secondary | ICD-10-CM | POA: Diagnosis not present

## 2020-11-19 DIAGNOSIS — R519 Headache, unspecified: Secondary | ICD-10-CM | POA: Diagnosis not present

## 2020-11-19 MED ORDER — BUPROPION HCL ER (XL) 150 MG PO TB24: 150.0000 mg | ORAL_TABLET | Freq: Every day | ORAL | 2 refills | Status: DC

## 2020-11-19 MED ORDER — TRAMADOL HCL 50 MG PO TABS: 50.0000 mg | ORAL_TABLET | Freq: Two times a day (BID) | ORAL | 2 refills | Status: DC | PRN
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
## 2020-11-19 NOTE — Assessment & Plan Note (Signed)
Avoiding NSAIDs,  Limiting alcohol use but not abstinent.  Needs CMET

## 2020-11-19 NOTE — Assessment & Plan Note (Signed)
Resuming wellbutrin xl 150 mg daily .  Follow up one month

## 2020-11-19 NOTE — Telephone Encounter (Signed)
Lft pt vm to call ofc to verify pt ins. thanks 

## 2020-11-22 ENCOUNTER — Telehealth: Payer: Self-pay | Admitting: Internal Medicine

## 2020-11-22 NOTE — Telephone Encounter (Signed)
Lft pt vm to call ofc to verify ins.thanks 

## 2020-11-29 ENCOUNTER — Telehealth: Payer: Self-pay | Admitting: Internal Medicine

## 2020-11-29 NOTE — Telephone Encounter (Signed)
Lft pt vm to call ofc to follow up with pt regarding pt ins. thanks

## 2020-12-01 ENCOUNTER — Telehealth: Payer: Self-pay | Admitting: Internal Medicine

## 2020-12-01 NOTE — Telephone Encounter (Signed)
Lft vm for pt to call ofc regarding if pt spoke to his ins before scheduling the MRI. thanks

## 2020-12-20 ENCOUNTER — Telehealth: Payer: Self-pay | Admitting: Internal Medicine

## 2020-12-20 NOTE — Telephone Encounter (Signed)
Lft pt vm to call ofc about ins. Thanks

## 2020-12-22 ENCOUNTER — Other Ambulatory Visit: Payer: Self-pay | Admitting: Internal Medicine

## 2020-12-24 ENCOUNTER — Telehealth: Payer: Self-pay | Admitting: Internal Medicine

## 2020-12-24 NOTE — Telephone Encounter (Signed)
Lft pt vm to call ofc to sch MRI and to get ins information. thanks

## 2020-12-27 ENCOUNTER — Other Ambulatory Visit: Payer: Self-pay | Admitting: Internal Medicine

## 2021-03-04 ENCOUNTER — Telehealth: Payer: Self-pay | Admitting: Internal Medicine

## 2021-03-04 NOTE — Telephone Encounter (Signed)
Lft pt vm to call ofc to follow up on pt adding new ins and if pt wants to have the MRI done. thanks

## 2021-03-21 ENCOUNTER — Other Ambulatory Visit: Payer: Self-pay | Admitting: Internal Medicine

## 2021-04-04 ENCOUNTER — Other Ambulatory Visit: Payer: Self-pay | Admitting: Internal Medicine

## 2021-07-25 ENCOUNTER — Other Ambulatory Visit: Payer: Self-pay | Admitting: Internal Medicine

## 2021-07-25 ENCOUNTER — Telehealth: Payer: Self-pay | Admitting: Internal Medicine

## 2021-07-25 NOTE — Telephone Encounter (Signed)
Pt need refill on col[facility]icine [facility] [place]

## 2021-07-25 NOTE — Telephone Encounter (Signed)
Medication has been refilled and pt is aware.  

## 2021-07-26 ENCOUNTER — Telehealth: Payer: Self-pay

## 2021-07-26 MED ORDER — COLCHICINE 0.6 MG PO TABS: ORAL_TABLET | ORAL | 0 refills | Status: DC

## 2021-07-26 NOTE — Addendum Note (Signed)
Addended by: Sandy Salaam on: 07/26/2021 12:05 PM   Modules accepted: Orders

## 2021-07-26 NOTE — Telephone Encounter (Signed)
Pt called stating that pharmacy did not get medication and pt stated the cma asked him to call back if any problems

## 2021-07-26 NOTE — Telephone Encounter (Signed)
Spoke with to let him know that medication has been received by pharmacy.

## 2021-07-26 NOTE — Telephone Encounter (Signed)
Patient states he spoke with Sandy Salaam, CMA, yesterday, and she asked him to call her back today [facility] [address] [facility] [place] [facility] not [facility] the prescription for colchicine 0.6 MG tablet.  [facility] today, and they had not received the prescription.  I let patient know that Shanda Bumps has a note in the system at 12:05pm stating the medication has been resent.  Patient states [facility] will [facility] when [facility] get [facility] from lunch to see if they have his medication.  Patient states he will let us know if they have not received it.

## 2021-07-26 NOTE — Telephone Encounter (Signed)
Medication has been resent

## 2021-09-12 ENCOUNTER — Other Ambulatory Visit: Payer: Self-pay | Admitting: Family

## 2021-09-14 NOTE — Telephone Encounter (Signed)
Pt need refill of tramadol [facility] [place]

## 2021-09-15 NOTE — Telephone Encounter (Signed)
Refilled: 04/04/2021 Last OV: 11/19/2020 Next OV: not scheduled

## 2021-09-15 NOTE — Telephone Encounter (Signed)
Please notify patient that the prescription  was Refilled for 30 days only because it has been  OVER 6 months since last visit and please schedule him  an OFFICE VISIT

## 2021-09-15 NOTE — Telephone Encounter (Signed)
Pt calling again... Pt is requesting refill on medication (traMADol (ULTRAM) 50 MG tablet)... Pt requesting callback

## 2021-09-16 ENCOUNTER — Other Ambulatory Visit: Payer: Self-pay | Admitting: Internal Medicine

## 2021-09-23 ENCOUNTER — Other Ambulatory Visit: Payer: Self-pay | Admitting: Internal Medicine

## 2021-10-21 ENCOUNTER — Other Ambulatory Visit: Payer: Self-pay | Admitting: Internal Medicine

## 2021-10-26 ENCOUNTER — Other Ambulatory Visit: Payer: Self-pay | Admitting: Internal Medicine

## 2021-11-11 ENCOUNTER — Ambulatory Visit: Payer: BC Managed Care – PPO | Admitting: Internal Medicine

## 2021-12-01 ENCOUNTER — Ambulatory Visit: Payer: BC Managed Care – PPO | Admitting: Internal Medicine

## 2021-12-29 ENCOUNTER — Other Ambulatory Visit: Payer: Self-pay | Admitting: Internal Medicine

## 2022-01-06 ENCOUNTER — Other Ambulatory Visit: Payer: Self-pay | Admitting: Internal Medicine

## 2022-01-06 NOTE — Telephone Encounter (Signed)
Spoke with pt to let him know that he has not been seen since 10/2020 and will need to schedule an appt to receive any refills. Pt stated that he is still not driving and is difficult for hiim to get into the office sometimes. Pt did schedule an appt for 01/25/2022, it was scheduled for in person but was advised that if he is unable to get someone to bring him to please let us know and we can cahnge it to a virtual visit. Pt gave a verbal understanding.    Tramadol Refilled: 10/23/2021 Last OV: 11/19/2020 Next OV: 01/25/2022

## 2022-01-09 ENCOUNTER — Other Ambulatory Visit: Payer: Self-pay | Admitting: Internal Medicine

## 2022-01-10 ENCOUNTER — Other Ambulatory Visit: Payer: Self-pay

## 2022-01-10 MED ORDER — TRAMADOL HCL 50 MG PO TABS: ORAL_TABLET | ORAL | 0 refills | Status: DC

## 2022-01-10 NOTE — Telephone Encounter (Signed)
Refilled: 10/23/2021 Last OV: 11/19/2020 Next OV: 01/25/2022

## 2022-01-10 NOTE — Telephone Encounter (Signed)
Pt is aware that medication has been refilled.  °

## 2022-01-25 ENCOUNTER — Encounter: Payer: Self-pay | Admitting: Internal Medicine

## 2022-01-25 ENCOUNTER — Ambulatory Visit (INDEPENDENT_AMBULATORY_CARE_PROVIDER_SITE_OTHER): Payer: Self-pay | Admitting: Internal Medicine

## 2022-01-25 VITALS — Ht 70.0 in | Wt 202.0 lb

## 2022-01-25 DIAGNOSIS — I10 Essential (primary) hypertension: Secondary | ICD-10-CM

## 2022-01-25 DIAGNOSIS — Z79899 Other long term (current) drug therapy: Secondary | ICD-10-CM

## 2022-01-25 DIAGNOSIS — M130 Polyarthritis, unspecified: Secondary | ICD-10-CM

## 2022-01-25 DIAGNOSIS — E042 Nontoxic multinodular goiter: Secondary | ICD-10-CM

## 2022-01-25 DIAGNOSIS — E7800 Pure hypercholesterolemia, unspecified: Secondary | ICD-10-CM

## 2022-01-25 DIAGNOSIS — Z125 Encounter for screening for malignant neoplasm of prostate: Secondary | ICD-10-CM

## 2022-01-25 DIAGNOSIS — E119 Type 2 diabetes mellitus without complications: Secondary | ICD-10-CM

## 2022-01-25 DIAGNOSIS — F321 Major depressive disorder, single episode, moderate: Secondary | ICD-10-CM

## 2022-01-25 MED ORDER — AMLODIPINE BESYLATE 5 MG PO TABS: 5.0000 mg | ORAL_TABLET | Freq: Every day | ORAL | 1 refills | Status: DC

## 2022-01-25 MED ORDER — TRAZODONE HCL 50 MG PO TABS: ORAL_TABLET | ORAL | 1 refills | Status: DC

## 2022-01-25 MED ORDER — METOPROLOL SUCCINATE ER 25 MG PO TB24: 25.0000 mg | ORAL_TABLET | Freq: Every day | ORAL | 0 refills | Status: DC

## 2022-01-25 MED ORDER — SERTRALINE HCL 50 MG PO TABS: 50.0000 mg | ORAL_TABLET | Freq: Every day | ORAL | 3 refills | Status: DC

## 2022-01-25 NOTE — Assessment & Plan Note (Signed)
Changing regimen to amlodipine and metoprolol due to lack of available monitoring of lytes and renal function .  Dc losartan and hctz

## 2022-01-25 NOTE — Patient Instructions (Signed)
Starting sertraline for your depression .  Take it once daily in the morning with food.   Changing your blood pressure regimen to amlodipine  and stopping hctz and losartan. You can finish your current supply of hctz and losartan but do not refill   Continue metoprolol  Please get your bp checked about a week after you make the changes  Please find out if there is a [facility] station in [place] so I can order labs  Please schedule a virtual or in person visit in mid January

## 2022-01-25 NOTE — Assessment & Plan Note (Signed)
Untreated due to being lost to follow up and elevated LFTs at last check 2021

## 2022-01-25 NOTE — Progress Notes (Unsigned)
Virtual Visit via Caregility   Note    This format is felt to be most appropriate for this patient at this time.  All issues noted in this document were discussed and addressed.  No physical exam was performed (except for noted visual exam findings with Video Visits).   I attempted to connect with Mr Cudworth on 01/25/22 at  2:30 PM EST by a video enabled telemedicine application  and verified that I am speaking with the correct person using two identifiers. Location patient: home Location provider: work or home office Persons participating in the virtual visit: patient, provider  I discussed the limitations, risks, security and privacy concerns of performing an evaluation and management service by telephone and the availability of in person appointments. I also discussed with the patient that there may be a patient responsible charge related to this service. The patient expressed understanding and agreed to proceed.  Interactive audio and video telecommunications were attempted between this provider and patient, however failed, due to patient having technical difficulties .  We continued and completed visit with audio only.   Reason for visit: medication refill    HPI: metoprol 61 yr old male with chronic vision loss,  unemployed [place] pending .  Has lost his insurance at times so MIR never got done.  Has not been in contact since Sept 2022 . Living in [place]   Has not had labs since 2021.   Feels that since COVID he has been more fuzzy headed.  Not sleeping well Not taking wellbutrin due to cost, has been taking tramadol,  metoprolol and hctz    ROS: See pertinent positives and negatives per HPI.  Past Medical History:  Diagnosis Date   Arthritis    KNEES   Cataract    REMOVED BILATERAL   Claustrophobia    GERD (gastroesophageal reflux disease)    with certain foods   Glaucoma (increased eye pressure)    Headache(784.0)    07/11/13- none in a long time   Hepatic  steatosis    Hyperglycemia    Hyperlipidemia    Hypertension    no meds   Sleep apnea     Past Surgical History:  Procedure Laterality Date   CATARACT EXTRACTION W/PHACO Left 07/16/2013   Procedure: CATARACT EXTRACTION PHACO AND INTRAOCULAR LENS PLACEMENT (IOC) LEFT EYE;  Surgeon: Chalmers Guest, MD;  Location: [facility] OR;  Service: Ophthalmology;  Laterality: Left;   CATARACT EXTRACTION W/PHACO Right 10/29/2013   Procedure: CATARACT EXTRACTION PHACO AND INTRAOCULAR LENS PLACEMENT (IOC);  Surgeon: Chalmers Guest, MD;  Location: [facility] OR;  Service: Ophthalmology;  Laterality: Right;   EYE SURGERY     2 on left eye   FINGER SURGERY Right    MINI SHUNT INSERTION  03/09/2011   Procedure: INSERTION OF MINI SHUNT;  Surgeon: Chalmers Guest, MD;  Location: [facility] OR;  Service: Ophthalmology;  Laterality: Left;    Family History  Problem Relation Age of Onset   Asthma Mother    Hyperlipidemia Mother    Heart disease Mother    Hypertension Mother    Liver cancer Mother    Thyroid disease Mother    Asthma Father    Hyperlipidemia Father    Heart disease Father    Stroke Father    Hypertension Father    Aneurysm Father    Aneurysm Brother        non smoker    Early death Brother    Asthma Paternal Aunt    Heart disease  Sister    Early death Sister    Migraines Neg Hx     SOCIAL HX: ***   Current Outpatient Medications:    colchicine 0.6 MG tablet, TAKE 1 TABLET BY MOUTH HOURLY UNTIL GOUT IMPROVED/DIARRHEA,ON 1ST DAY OF TREATMENT,THEN 1 TAB DAILY THEREAFTER IF NEEDED, Disp: 30 tablet, Rfl: 8   hydrochlorothiazide (HYDRODIURIL) 25 MG tablet, TAKE 1 TABLET BY MOUTH EVERY DAY, Disp: 90 tablet, Rfl: 3   losartan (COZAAR) 100 MG tablet, TAKE 1 TABLET BY MOUTH EVERY DAY, Disp: 90 tablet, Rfl: 3   metoprolol succinate (TOPROL-XL) 25 MG 24 hr tablet, TAKE 1 TABLET (25 MG TOTAL) BY MOUTH DAILY. NEEDS APPT FOR ADDITIONAL REFILLS, Disp: 30 tablet, Rfl: 0   ondansetron (ZOFRAN-ODT) 4 MG disintegrating tablet, ,  Disp: , Rfl:    pantoprazole (PROTONIX) 40 MG tablet, TAKE 1 TABLET BY MOUTH TWICE A DAY, Disp: 180 tablet, Rfl: 1   prednisoLONE acetate (PRED FORTE) 1 % ophthalmic suspension, INSTILL 1 DROP IN BOTH EYES 4 TIMES DAILY, Disp: , Rfl:    traMADol (ULTRAM) 50 MG tablet, TAKE 1 TABLET BY MOUTH EVERY 12 (TWELVE) HOURS AS NEEDED. FOR PAIN FOR 30 DAYS, Disp: 60 tablet, Rfl: 0   traZODone (DESYREL) 50 MG tablet, TAKE 1 TABLET BY MOUTH EVERY DAY AS NEEDED FOR SLEEP, Disp: 90 tablet, Rfl: 1  EXAM:  VITALS per patient if applicable:  GENERAL: alert, oriented, appears well and in no acute distress  HEENT: atraumatic, conjunttiva clear, no obvious abnormalities on inspection of external nose and ears  NECK: normal movements of the head and neck  LUNGS: on inspection no signs of respiratory distress, breathing rate appears normal, no obvious gross SOB, gasping or wheezing  CV: no obvious cyanosis  MS: moves all visible extremities without noticeable abnormality  PSYCH/NEURO: pleasant and cooperative, no obvious depression or anxiety, speech and thought processing grossly intact  ASSESSMENT AND PLAN:  Discussed the following assessment and plan:  Primary hypertension  Diet-controlled diabetes mellitus (HCC)  Multiple thyroid nodules  Screening for prostate cancer  Pure hypercholesterolemia  Long-term use of high-risk medication  No problem-specific Assessment & Plan notes found for this encounter.    I discussed the assessment and treatment plan with the patient. The patient was provided an opportunity to ask questions and all were answered. The patient agreed with the plan and demonstrated an understanding of the instructions.   The patient was advised to call back or seek an in-person evaluation if the symptoms worsen or if the condition fails to improve as anticipated.   I spent 30 minutes dedicated to the care of this patient on the date of this encounter to include pre-visit  review of his medical history,  non Face-to-face time with the patient , and post visit ordering of testing and therapeutics.    Crecencio Mc, MD

## 2022-01-26 NOTE — Assessment & Plan Note (Signed)
He denies current symptoms despite stopping medications

## 2022-01-26 NOTE — Assessment & Plan Note (Signed)
Resume use of tramadol and tylenol    Refill history confirmed via [place] Controlled Substance databas, accessed by me today.. one month refill given until he can be seen.

## 2022-01-26 NOTE — Assessment & Plan Note (Signed)
His  fasting  Glucose was elevated and  diagnostic of diabetes in Sept 2018 ,  but he has been lost to follow up since 2021/   Lab Results  Component Value Date   HGBA1C 5.8 (H) 07/23/2019

## 2022-03-22 ENCOUNTER — Telehealth: Payer: Self-pay

## 2022-03-22 NOTE — Telephone Encounter (Signed)
Patient has scheduled a lab appointment on 03/24/2022.  Patient states he would like to know when Dr. Deborra Medina would like to see him in the office.

## 2022-03-23 NOTE — Telephone Encounter (Signed)
Spoke with pt and he stated that he is already scheduled for 03/29/2022.

## 2022-03-24 ENCOUNTER — Other Ambulatory Visit (INDEPENDENT_AMBULATORY_CARE_PROVIDER_SITE_OTHER): Payer: Medicaid Other

## 2022-03-24 ENCOUNTER — Other Ambulatory Visit: Payer: Self-pay

## 2022-03-24 DIAGNOSIS — I10 Essential (primary) hypertension: Secondary | ICD-10-CM | POA: Diagnosis not present

## 2022-03-24 DIAGNOSIS — E7800 Pure hypercholesterolemia, unspecified: Secondary | ICD-10-CM

## 2022-03-24 DIAGNOSIS — E042 Nontoxic multinodular goiter: Secondary | ICD-10-CM

## 2022-03-24 DIAGNOSIS — Z79899 Other long term (current) drug therapy: Secondary | ICD-10-CM

## 2022-03-24 DIAGNOSIS — E119 Type 2 diabetes mellitus without complications: Secondary | ICD-10-CM | POA: Diagnosis not present

## 2022-03-24 DIAGNOSIS — Z125 Encounter for screening for malignant neoplasm of prostate: Secondary | ICD-10-CM

## 2022-03-24 LAB — LIPID PANEL
Cholesterol: 251 mg/dL — ABNORMAL HIGH (ref 0–200)
HDL: 58 mg/dL (ref >39.00)
LDL Cholesterol: 155 mg/dL — ABNORMAL HIGH (ref 0–99)
NonHDL: 193.17
Total CHOL/HDL Ratio: 4
Triglycerides: 189 mg/dL — ABNORMAL HIGH (ref 0.0–149.0)
VLDL: 37.8 mg/dL (ref 0.0–40.0)

## 2022-03-24 LAB — CBC WITH DIFFERENTIAL/PLATELET
Basophils Absolute: 0.1 10*3/uL (ref 0.0–0.1)
Basophils Relative: 1.4 % (ref 0.0–3.0)
Eosinophils Absolute: 0.6 10*3/uL (ref 0.0–0.7)
Eosinophils Relative: 11 % — ABNORMAL HIGH (ref 0.0–5.0)
HCT: 42.9 % (ref 39.0–52.0)
Hemoglobin: 14.7 g/dL (ref 13.0–17.0)
Lymphocytes Relative: 26.2 % (ref 12.0–46.0)
Lymphs Abs: 1.5 10*3/uL (ref 0.7–4.0)
MCHC: 34.2 g/dL (ref 30.0–36.0)
MCV: 95.2 fl (ref 78.0–100.0)
Monocytes Absolute: 0.7 10*3/uL (ref 0.1–1.0)
Monocytes Relative: 12.9 % — ABNORMAL HIGH (ref 3.0–12.0)
Neutro Abs: 2.8 10*3/uL (ref 1.4–7.7)
Neutrophils Relative %: 48.5 % (ref 43.0–77.0)
Platelets: 307 10*3/uL (ref 150.0–400.0)
RBC: 4.51 Mil/uL (ref 4.22–5.81)
RDW: 14.7 % (ref 11.5–15.5)
WBC: 5.7 10*3/uL (ref 4.0–10.5)

## 2022-03-24 LAB — COMPREHENSIVE METABOLIC PANEL
ALT: 34 U/L (ref 0–53)
AST: 27 U/L (ref 0–37)
Albumin: 4 g/dL (ref 3.5–5.2)
Alkaline Phosphatase: 68 U/L (ref 39–117)
BUN: 9 mg/dL (ref 6–23)
CO2: 31 mEq/L (ref 19–32)
Calcium: 9 mg/dL (ref 8.4–10.5)
Chloride: 97 mEq/L (ref 96–112)
Creatinine, Ser: 0.88 mg/dL (ref 0.40–1.50)
GFR: 93.81 mL/min (ref >60.00)
Glucose, Bld: 112 mg/dL — ABNORMAL HIGH (ref 70–99)
Potassium: 4.7 mEq/L (ref 3.5–5.1)
Sodium: 135 mEq/L (ref 135–145)
Total Bilirubin: 0.5 mg/dL (ref 0.2–1.2)
Total Protein: 6.9 g/dL (ref 6.0–8.3)

## 2022-03-24 LAB — LDL CHOLESTEROL, DIRECT: Direct LDL: 150 mg/dL

## 2022-03-24 LAB — MICROALBUMIN / CREATININE URINE RATIO
Creatinine,U: 106.5 mg/dL
Microalb Creat Ratio: 1.3 mg/g (ref 0.0–30.0)
Microalb, Ur: 1.4 mg/dL (ref 0.0–1.9)

## 2022-03-24 LAB — HEMOGLOBIN A1C: Hgb A1c MFr Bld: 5.9 % (ref 4.6–6.5)

## 2022-03-24 LAB — PSA: PSA: 4.35 ng/mL — ABNORMAL HIGH (ref 0.10–4.00)

## 2022-03-24 LAB — TSH: TSH: 2.27 u[IU]/mL (ref 0.35–5.50)

## 2022-03-29 ENCOUNTER — Ambulatory Visit: Payer: Medicaid Other | Admitting: Internal Medicine

## 2022-03-29 ENCOUNTER — Ambulatory Visit (INDEPENDENT_AMBULATORY_CARE_PROVIDER_SITE_OTHER): Payer: Medicaid Other

## 2022-03-29 ENCOUNTER — Other Ambulatory Visit: Payer: Self-pay | Admitting: Internal Medicine

## 2022-03-29 ENCOUNTER — Encounter: Payer: Self-pay | Admitting: Internal Medicine

## 2022-03-29 VITALS — BP 150/100 | HR 84 | Temp 97.9°F | Ht 70.0 in | Wt 211.0 lb

## 2022-03-29 DIAGNOSIS — M5412 Radiculopathy, cervical region: Secondary | ICD-10-CM

## 2022-03-29 DIAGNOSIS — R972 Elevated prostate specific antigen [PSA]: Secondary | ICD-10-CM | POA: Diagnosis not present

## 2022-03-29 DIAGNOSIS — R0781 Pleurodynia: Secondary | ICD-10-CM | POA: Diagnosis not present

## 2022-03-29 DIAGNOSIS — E7800 Pure hypercholesterolemia, unspecified: Secondary | ICD-10-CM

## 2022-03-29 DIAGNOSIS — I10 Essential (primary) hypertension: Secondary | ICD-10-CM

## 2022-03-29 DIAGNOSIS — Z125 Encounter for screening for malignant neoplasm of prostate: Secondary | ICD-10-CM

## 2022-03-29 DIAGNOSIS — M542 Cervicalgia: Secondary | ICD-10-CM

## 2022-03-29 MED ORDER — PANTOPRAZOLE SODIUM 40 MG PO TBEC: 40.0000 mg | DELAYED_RELEASE_TABLET | Freq: Two times a day (BID) | ORAL | 1 refills | Status: DC

## 2022-03-29 MED ORDER — HYDROCHLOROTHIAZIDE 25 MG PO TABS: 25.0000 mg | ORAL_TABLET | Freq: Every day | ORAL | 11 refills | Status: DC

## 2022-03-29 MED ORDER — ROSUVASTATIN CALCIUM 20 MG PO TABS: 20.0000 mg | ORAL_TABLET | Freq: Every day | ORAL | 11 refills | Status: DC

## 2022-03-29 MED ORDER — SERTRALINE HCL 50 MG PO TABS: 50.0000 mg | ORAL_TABLET | Freq: Every day | ORAL | 3 refills | Status: DC

## 2022-03-29 NOTE — Patient Instructions (Addendum)
AVOID PHENYLEPHRINE AND PSEUDOEPHEDRINE. THEY CAN BOTH CAUSE ELEVATED BLOOD PRESSURE READINGS    Your PSA is elevated , > 4  I am referring to [facility] in [place]  Reduce refined sugars   (sodas, cookies,  sweet teas) to reduce sugar intake    Adding hctz 25 mg daily to your current regimen of amlodipine and   We will recheck BP in 3-4 weeks when you return for  blood tests   Cervical and rib x  rays today,  MRI cervical spine if x ray suggests there may be a herniated disk

## 2022-03-29 NOTE — Telephone Encounter (Signed)
Pt need a refill on traMADol sent to [facility]

## 2022-03-29 NOTE — Telephone Encounter (Signed)
Refilled: 01/10/2022 Last OV: 03/29/2022 Next OV: not scheduled
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
## 2022-03-30 DIAGNOSIS — R0781 Pleurodynia: Secondary | ICD-10-CM | POA: Insufficient documentation

## 2022-03-30 DIAGNOSIS — R972 Elevated prostate specific antigen [PSA]: Secondary | ICD-10-CM | POA: Insufficient documentation

## 2022-03-30 NOTE — Assessment & Plan Note (Signed)
Plain films ordered to document any fractures.

## 2022-03-30 NOTE — Assessment & Plan Note (Signed)
Resuming rosuvastatin for LDL goal of 100 or less . Return in 3 weeks for LFTs

## 2022-03-30 NOTE — Assessment & Plan Note (Signed)
PSA is > 4.  Referring to [facility]

## 2022-03-30 NOTE — Assessment & Plan Note (Signed)
Not at goal on losartan and amlodipine.  Adding hctz
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
## 2022-03-31 ENCOUNTER — Telehealth: Payer: Self-pay

## 2022-03-31 DIAGNOSIS — M5412 Radiculopathy, cervical region: Secondary | ICD-10-CM
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
## 2022-04-04 NOTE — Telephone Encounter (Signed)
Pt return call  

## 2022-04-05 NOTE — Telephone Encounter (Signed)
See result note.  

## 2022-04-07 NOTE — Telephone Encounter (Signed)
Pt called back and I read the message to him and he stated he will call back on Monday and talk to the cma since its the weekend

## 2022-04-10 NOTE — Telephone Encounter (Signed)
Pt called returning [place] call.

## 2022-04-10 NOTE — Telephone Encounter (Signed)
LMTCB

## 2022-04-11 NOTE — Telephone Encounter (Signed)
LMTCB

## 2022-04-14 ENCOUNTER — Other Ambulatory Visit: Payer: Self-pay

## 2022-04-14 ENCOUNTER — Telehealth: Payer: Self-pay

## 2022-04-14 NOTE — Telephone Encounter (Signed)
Referral made to [facility] rehab PT .  If PT needs to be done elsewhere , let me know

## 2022-04-14 NOTE — Addendum Note (Signed)
Addended by: Crecencio Mc on: 04/14/2022 05:41 PM   Modules accepted: Orders

## 2022-04-14 NOTE — Telephone Encounter (Signed)
Requesting: Tramadol Contract: No UDS: NO Last Visit: 03/29/2022 Next Visit: Not scheduled Last Refill: 03/30/2022  Please Advise

## 2022-04-14 NOTE — Telephone Encounter (Signed)
Error PA is needed.

## 2022-04-14 NOTE — Telephone Encounter (Signed)
PA for Tramadol is needed

## 2022-04-14 NOTE — Telephone Encounter (Signed)
Pt called back and I made sure he understood the results of his X-rays. Pt gave a verbal understanding and stated that he agrees with doing the PT for 6 weeks.

## 2022-04-17 ENCOUNTER — Telehealth: Payer: Self-pay

## 2022-04-17 ENCOUNTER — Other Ambulatory Visit (HOSPITAL_COMMUNITY): Payer: Self-pay

## 2022-04-17 NOTE — Telephone Encounter (Signed)
noted 

## 2022-04-17 NOTE — Telephone Encounter (Signed)
Pharmacy Patient Advocate Encounter   Received notification that prior authorization for Tramadol '50mg'$   is required/requested.  Per Test Claim: Plan limitation exceeded 5 days supply. Requires prior authorization.   PA submitted on 04/17/22 to (ins) [employer] [place]  via CoverMyMeds Key [id number] Status is pending

## 2022-04-17 NOTE — Telephone Encounter (Signed)
Patient states he is following up on his prescription for traMADol (ULTRAM) 50 MG tablet.  I let patient know that a prior authorization is required for this medication and I see a note from today that states it has been submitted.  Patient states his gout has been acting up and he would like to have some medication for this.  I have scheduled an appointment for patient with Mable Paris, FNP, on 04/19/2022, which will work with patient's schedule.  Patient states Dr. Derrel Nip changed his blood pressure medication and he is scheduled to come in on 04/27/2022 for a blood pressure check with nurse.  Patient states he has been experiencing fatigue since he started this new medication.  Patient states his legs are no longer swelling.

## 2022-04-18 ENCOUNTER — Ambulatory Visit: Payer: Medicaid Other | Admitting: Family

## 2022-04-18 ENCOUNTER — Other Ambulatory Visit: Payer: Self-pay | Admitting: Internal Medicine

## 2022-04-18 NOTE — Telephone Encounter (Signed)
Patient returned phone call and note was read.

## 2022-04-18 NOTE — Telephone Encounter (Signed)
Patient Advocate Encounter  Prior Authorization for Tramadol '50mg'$  has been approved.    PA# PA Case: [id number] Effective dates: 04/17/22 through 10/14/22   Approval letter attached to chart

## 2022-04-18 NOTE — Telephone Encounter (Signed)
LMTCB. Need to let pt know that Tramadol has been approved he can get it refilled now.

## 2022-04-19 ENCOUNTER — Ambulatory Visit: Payer: Medicaid Other | Admitting: Family

## 2022-04-25 ENCOUNTER — Ambulatory Visit: Payer: Medicaid Other | Admitting: Internal Medicine

## 2022-04-25 ENCOUNTER — Ambulatory Visit (INDEPENDENT_AMBULATORY_CARE_PROVIDER_SITE_OTHER): Payer: Medicaid Other

## 2022-04-25 ENCOUNTER — Encounter: Payer: Self-pay | Admitting: Internal Medicine

## 2022-04-25 ENCOUNTER — Other Ambulatory Visit (INDEPENDENT_AMBULATORY_CARE_PROVIDER_SITE_OTHER): Payer: Medicaid Other

## 2022-04-25 VITALS — BP 128/88 | HR 76 | Temp 97.9°F | Ht 70.0 in | Wt 191.2 lb

## 2022-04-25 DIAGNOSIS — R0781 Pleurodynia: Secondary | ICD-10-CM | POA: Diagnosis not present

## 2022-04-25 DIAGNOSIS — M25571 Pain in right ankle and joints of right foot: Secondary | ICD-10-CM | POA: Insufficient documentation

## 2022-04-25 DIAGNOSIS — M4722 Other spondylosis with radiculopathy, cervical region: Secondary | ICD-10-CM | POA: Diagnosis not present

## 2022-04-25 DIAGNOSIS — E7800 Pure hypercholesterolemia, unspecified: Secondary | ICD-10-CM

## 2022-04-25 DIAGNOSIS — M542 Cervicalgia: Secondary | ICD-10-CM

## 2022-04-25 DIAGNOSIS — M130 Polyarthritis, unspecified: Secondary | ICD-10-CM | POA: Diagnosis not present

## 2022-04-25 DIAGNOSIS — I10 Essential (primary) hypertension: Secondary | ICD-10-CM | POA: Diagnosis not present

## 2022-04-25 LAB — COMPREHENSIVE METABOLIC PANEL
ALT: 20 U/L (ref 0–53)
AST: 17 U/L (ref 0–37)
Albumin: 3.7 g/dL (ref 3.5–5.2)
Alkaline Phosphatase: 81 U/L (ref 39–117)
BUN: 6 mg/dL (ref 6–23)
CO2: 28 mEq/L (ref 19–32)
Calcium: 9.8 mg/dL (ref 8.4–10.5)
Chloride: 95 mEq/L — ABNORMAL LOW (ref 96–112)
Creatinine, Ser: 0.94 mg/dL (ref 0.40–1.50)
GFR: 88.38 mL/min (ref >60.00)
Glucose, Bld: 108 mg/dL — ABNORMAL HIGH (ref 70–99)
Potassium: 3.8 mEq/L (ref 3.5–5.1)
Sodium: 133 mEq/L — ABNORMAL LOW (ref 135–145)
Total Bilirubin: 0.8 mg/dL (ref 0.2–1.2)
Total Protein: 7.4 g/dL (ref 6.0–8.3)

## 2022-04-25 LAB — URIC ACID: Uric Acid, Serum: 6.9 mg/dL (ref 4.0–7.8)

## 2022-04-25 LAB — C-REACTIVE PROTEIN: CRP: 10.9 mg/dL (ref 0.5–20.0)

## 2022-04-25 LAB — SEDIMENTATION RATE: Sed Rate: 44 mm/hr — ABNORMAL HIGH (ref 0–20)

## 2022-04-25 MED ORDER — PREDNISONE 10 MG PO TABS: ORAL_TABLET | ORAL | 0 refills | Status: DC

## 2022-04-25 NOTE — Assessment & Plan Note (Signed)
SUGGESTED BY PLAIN FILMS  neurologic exam is normal.  PT referral made.  Continue prn use of tramadol

## 2022-04-25 NOTE — Progress Notes (Signed)
Subjective:  Patient ID: Jeff [name], male    DOB: 09/15/1960  Age: 62 y.o. MRN: [id number]  CC: The primary encounter diagnosis was Rib pain on left side. Diagnoses of Polyarthritis, Cervical spondylosis with radiculopathy, Acute right ankle pain, Pure hypercholesterolemia, and Neck pain were also pertinent to this visit.   HPI Jeff [name] presents for follow up on rib pain and  evaluation of recurrent joint pain  Chief Complaint  Patient presents with   Pain    Joint pain in right ankle, left hand   Left knee pain occurred last week,  knee was warm and pain was constant even at rest .  History of gout,  self treated with colchcine taken every 4 hours for a week,  resolved the pain but  had diarrhea with frequent dosing.    Stools are back to normal.  On /Saturday  his right ankle became warm,  swollen, red and painful.  both medial and lateral sides. Great toe also red and painful/swollen same foot. He restarted colchicine yesterday had , 3 doses .  Left elbow left thumb have also been warm and swollen but improving for the past 2 weeks. Denies fevers,  not sexually active.     Rib fractures 10-12 noted on plain films done at visit several weeks ago.  Histor of fall 8 weeks prior  Neck pain.  Both arms going numb at night . Not occurring during the day,  denies arm and  hand weakness.   Reviewed plain films; he is willing to start PT but limited by lack of transportation.  Using tylenol and tramadol    Outpatient Medications Prior to Visit  Medication Sig Dispense Refill   amLODipine (NORVASC) 5 MG tablet Take 1 tablet (5 mg total) by mouth daily. 90 tablet 1   colchicine 0.6 MG tablet TAKE 1 TABLET BY MOUTH HOURLY UNTIL GOUT IMPROVED/DIARRHEA,ON 1ST DAY OF TREATMENT,THEN 1 TAB DAILY THEREAFTER IF NEEDED 30 tablet 8   hydrochlorothiazide (HYDRODIURIL) 25 MG tablet Take 1 tablet (25 mg total) by mouth daily. 30 tablet 11   metoprolol succinate (TOPROL-XL) 25 MG 24 hr tablet  TAKE 1 TABLET (25 MG TOTAL) BY MOUTH DAILY. NEEDS APPT FOR ADDITIONAL REFILLS 90 tablet 1   pantoprazole (PROTONIX) 40 MG tablet Take 1 tablet (40 mg total) by mouth 2 (two) times daily. 180 tablet 1   prednisoLONE acetate (PRED FORTE) 1 % ophthalmic suspension INSTILL 1 DROP IN BOTH EYES 4 TIMES DAILY     rosuvastatin (CRESTOR) 20 MG tablet Take 1 tablet (20 mg total) by mouth daily. 30 tablet 11   sertraline (ZOLOFT) 50 MG tablet Take 1 tablet (50 mg total) by mouth daily. 30 tablet 3   traMADol (ULTRAM) 50 MG tablet TAKE 1 TABLET BY MOUTH EVERY 12 (TWELVE) HOURS AS NEEDED. FOR PAIN FOR 30 DAYS 60 tablet 5   No facility-administered medications prior to visit.    Review of Systems;  Patient denies headache, fevers, malaise, unintentional weight loss, skin rash, eye pain, sinus congestion and sinus pain, sore throat, dysphagia,  hemoptysis , cough, dyspnea, wheezing, chest pain, palpitations, orthopnea, edema, abdominal pain, nausea, melena, diarrhea, constipation, flank pain, dysuria, hematuria, urinary  Frequency, nocturia, numbness, tingling, seizures,  Focal weakness, Loss of consciousness,  Tremor, insomnia, depression, anxiety, and suicidal ideation.      Objective:  BP 128/88   Pulse 76   Temp 97.9 F (36.6 C) (Oral)   Ht '5\' 10"'$  (1.778 m)  Wt 191 lb 3.2 oz (86.7 kg)   SpO2 99%   BMI 27.43 kg/m   BP Readings from Last 3 Encounters:  04/25/22 128/88  03/29/22 (!) 150/100  02/09/20 112/78    Wt Readings from Last 3 Encounters:  04/25/22 191 lb 3.2 oz (86.7 kg)  03/29/22 211 lb (95.7 kg)  01/25/22 202 lb (91.6 kg)    Physical Exam Vitals reviewed.  Constitutional:      General: He is not in acute distress.    Appearance: Normal appearance. He is normal weight. He is not ill-appearing, toxic-appearing or diaphoretic.  HENT:     Head: Normocephalic.  Eyes:     General: No scleral icterus.       Right eye: No discharge.        Left eye: No discharge.      Conjunctiva/sclera: Conjunctivae normal.  Cardiovascular:     Rate and Rhythm: Normal rate and regular rhythm.     Heart sounds: Normal heart sounds.  Pulmonary:     Effort: Pulmonary effort is normal. No respiratory distress.     Breath sounds: Normal breath sounds.  Musculoskeletal:        General: Swelling present. Normal range of motion.     Cervical back: Normal range of motion.       Feet:  Feet:     Comments: Erythema and warmth of bilateral malleoli and right great toe  Skin:    General: Skin is warm and dry.  Neurological:     General: No focal deficit present.     Mental Status: He is alert and oriented to person, place, and time. Mental status is at baseline.     Motor: No weakness or atrophy.     Deep Tendon Reflexes: Reflexes are normal and symmetric.     Reflex Scores:      Tricep reflexes are 2+ on the left side.      Bicep reflexes are 2+ on the right side and 2+ on the left side.      Brachioradialis reflexes are 2+ on the right side and 2+ on the left side. Psychiatric:        Mood and Affect: Mood normal.        Behavior: Behavior normal.        Thought Content: Thought content normal.        Judgment: Judgment normal.   Lab Results  Component Value Date   HGBA1C 5.9 03/24/2022   HGBA1C 5.8 (H) 07/23/2019   HGBA1C 5.6 12/28/2017    Lab Results  Component Value Date   CREATININE 0.88 03/24/2022   CREATININE 0.90 07/23/2019   CREATININE 0.93 03/08/2018    Lab Results  Component Value Date   WBC 5.7 03/24/2022   HGB 14.7 03/24/2022   HCT 42.9 03/24/2022   PLT 307.0 03/24/2022   GLUCOSE 112 (H) 03/24/2022   CHOL 251 (H) 03/24/2022   TRIG 189.0 (H) 03/24/2022   HDL 58.00 03/24/2022   LDLDIRECT 150.0 03/24/2022   LDLCALC 155 (H) 03/24/2022   ALT 34 03/24/2022   AST 27 03/24/2022   NA 135 03/24/2022   K 4.7 03/24/2022   CL 97 03/24/2022   CREATININE 0.88 03/24/2022   BUN 9 03/24/2022   CO2 31 03/24/2022   TSH 2.27 03/24/2022   PSA 4.35 (H)  03/24/2022   HGBA1C 5.9 03/24/2022   MICROALBUR 1.4 03/24/2022    MYOCARDIAL PERFUSION IMAGING  Result Date: 05/03/2018  The left ventricular ejection fraction is  normal (55-65%).  Nuclear stress EF: 60%.  Blood pressure demonstrated a normal response to exercise.  The study is normal.  This is a low risk study.  Normal exercise nuclear stress test with no evidence for prior infarct or ischemia.  Excellent exercise capacity and normal blood pressure response to exertion.    Assessment & Plan:  .Rib pain on left side Assessment & Plan: Secondary to displaced fractures of 10-12 noted on plain films done 8 weeks after fall.    Polyarthritis Assessment & Plan: 2 flares of presumed gout back to back. Managedn with colchicine ,  tramadol and tylenol  . Prednisone prolonged taper prescribed to allow time to start allopurinol if uric acid level is high  Refill history confirmed via [place] Controlled Substance databas, accessed by me today..  Orders: -     Uric acid -     Sedimentation rate -     C-reactive protein -     ANA -     ANA -     Rheumatoid factor -     Cyclic citrul peptide antibody, IgG  Cervical spondylosis with radiculopathy Assessment & Plan: SUGGESTED BY PLAIN FILMS  neurologic exam is normal.  PT referral made.  Continue prn use of tramadol   Orders: -     Ambulatory referral to Physical Therapy  Acute right ankle pain -     DG Ankle 2 Views Right; Future  Pure hypercholesterolemia Assessment & Plan: Resumed rosuvastatin for LDL goal of 100 or less . Due for LFTs   Orders: -     Comprehensive metabolic panel  Neck pain  Other orders -     predniSONE; 6 tablets daily for 3 days, then reduce by 1 tablet daily until gone  Dispense: 33 tablet; Refill: 0   Follow-up: No follow-ups on file.   Crecencio Mc, MD

## 2022-04-25 NOTE — Assessment & Plan Note (Signed)
Secondary to displaced fractures of 10-12 noted on plain films done 8 weeks after fall.

## 2022-04-25 NOTE — Assessment & Plan Note (Addendum)
2 flares of presumed gout back to back. Managedn with colchicine ,  tramadol and tylenol  . Prednisone prolonged taper prescribed to allow time to start allopurinol if uric acid level is high  Refill history confirmed via [place] Controlled Substance databas, accessed by me today.Marland Kitchen

## 2022-04-25 NOTE — Patient Instructions (Addendum)
Start the prednisone today:  60 mg daily for 3 days,, then taper by 10 mg daily until gone  Stop the colchicine for now  Referral for physical therapy for neck in process  Consider getting a better quality pillow with NECK SUPPORT

## 2022-04-25 NOTE — Assessment & Plan Note (Signed)
Resumed rosuvastatin for LDL goal of 100 or less . Due for LFTs

## 2022-04-27 ENCOUNTER — Ambulatory Visit: Payer: Medicaid Other

## 2022-04-27 LAB — RHEUMATOID FACTOR: Rheumatoid fact SerPl-aCnc: <14 IU/mL (ref <14)

## 2022-04-27 LAB — ANA: Anti Nuclear Antibody (ANA): NEGATIVE

## 2022-04-27 LAB — CYCLIC CITRUL PEPTIDE ANTIBODY, IGG: Cyclic Citrullin Peptide Ab: <16 UNITS

## 2022-04-27 MED ORDER — ALLOPURINOL 100 MG PO TABS: 100.0000 mg | ORAL_TABLET | Freq: Every day | ORAL | 1 refills | Status: DC

## 2022-04-27 NOTE — Addendum Note (Signed)
Addended by: Crecencio Mc on: 04/27/2022 04:11 PM   Modules accepted: Orders

## 2022-04-28 ENCOUNTER — Telehealth: Payer: Self-pay

## 2022-04-28 NOTE — Telephone Encounter (Signed)
error 

## 2022-05-03 ENCOUNTER — Other Ambulatory Visit (HOSPITAL_COMMUNITY): Payer: Self-pay

## 2022-05-05 ENCOUNTER — Other Ambulatory Visit: Payer: Self-pay | Admitting: Internal Medicine

## 2022-05-05 MED ORDER — PREDNISONE 10 MG PO TABS: ORAL_TABLET | ORAL | 0 refills | Status: DC

## 2022-05-05 NOTE — Addendum Note (Signed)
Addended by: Crecencio Mc on: 05/05/2022 02:58 PM   Modules accepted: Orders

## 2022-05-26 ENCOUNTER — Telehealth: Payer: Self-pay | Admitting: Internal Medicine

## 2022-05-29 NOTE — Telephone Encounter (Signed)
Pt need a refill on predniSONE sent to [facility] and pt want to know if he can take two allopurinol without causing harm because one is not helping

## 2022-05-31 ENCOUNTER — Other Ambulatory Visit: Payer: Self-pay | Admitting: Internal Medicine

## 2022-05-31 DIAGNOSIS — M25571 Pain in right ankle and joints of right foot: Secondary | ICD-10-CM

## 2022-05-31 DIAGNOSIS — Z8739 Personal history of other diseases of the musculoskeletal system and connective tissue: Secondary | ICD-10-CM

## 2022-05-31 NOTE — Telephone Encounter (Signed)
LMTCB

## 2022-06-30 ENCOUNTER — Telehealth: Payer: Self-pay | Admitting: Internal Medicine

## 2022-06-30 NOTE — Telephone Encounter (Signed)
Pt is aware and gave a verbal understanding.  

## 2022-06-30 NOTE — Telephone Encounter (Signed)
Spoke with pt and he thinks he is having another gout flare in his left foot. He stated that his toes, foot and ankle are tender radiating into his left knee are tender, red swollen and warm. He stated it is the same symptoms he gets everytime he has a gout flare. Pt is wanting to know if he could get a prescription for prednisone. He scheduled an appt for Tuesday with Dr. Darrick Huntsman but doesn't think he can wait until then being that he has been dealing with this for the last 2-3 weeks.
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
## 2022-07-04 ENCOUNTER — Ambulatory Visit: Payer: Medicaid Other | Admitting: Internal Medicine

## 2022-07-04 ENCOUNTER — Encounter: Payer: Self-pay | Admitting: Internal Medicine

## 2022-07-04 VITALS — BP 140/92 | HR 85 | Temp 79.9°F | Ht 70.0 in | Wt 183.0 lb

## 2022-07-04 DIAGNOSIS — Z8739 Personal history of other diseases of the musculoskeletal system and connective tissue: Secondary | ICD-10-CM | POA: Diagnosis not present

## 2022-07-04 DIAGNOSIS — M25571 Pain in right ankle and joints of right foot: Secondary | ICD-10-CM | POA: Diagnosis not present

## 2022-07-04 DIAGNOSIS — I10 Essential (primary) hypertension: Secondary | ICD-10-CM | POA: Diagnosis not present

## 2022-07-04 DIAGNOSIS — E7849 Other hyperlipidemia: Secondary | ICD-10-CM | POA: Diagnosis not present

## 2022-07-04 DIAGNOSIS — M109 Gout, unspecified: Secondary | ICD-10-CM

## 2022-07-04 MED ORDER — SERTRALINE HCL 50 MG PO TABS: 50.0000 mg | ORAL_TABLET | Freq: Every day | ORAL | 1 refills | Status: DC

## 2022-07-04 MED ORDER — TRAMADOL HCL 50 MG PO TABS: 50.0000 mg | ORAL_TABLET | Freq: Four times a day (QID) | ORAL | 5 refills | Status: DC | PRN

## 2022-07-04 MED ORDER — PREDNISONE 10 MG PO TABS
ORAL_TABLET | ORAL | 0 refills | Status: DC
Start: 2022-07-04 — End: 2023-02-21

## 2022-07-04 MED ORDER — AMLODIPINE BESYLATE 10 MG PO TABS: 10.0000 mg | ORAL_TABLET | Freq: Every day | ORAL | 1 refills | Status: DC

## 2022-07-04 MED ORDER — METOPROLOL SUCCINATE ER 25 MG PO TB24: 12.5000 mg | ORAL_TABLET | Freq: Every day | ORAL | 1 refills | Status: DC

## 2022-07-04 MED ORDER — ALLOPURINOL 200 MG PO TABS: 200.0000 mg | ORAL_TABLET | Freq: Every day | ORAL | 1 refills | Status: DC

## 2022-07-04 MED ORDER — COLCHICINE 0.6 MG PO TABS: ORAL_TABLET | ORAL | 8 refills | Status: DC

## 2022-07-04 NOTE — Patient Instructions (Addendum)
For the blood pressure:  Double the amlodipine to 10  mg daily  Reduce the metoprolol to 12.5  mg daily   For the gout:  Increase the allopurinol to 200 mg daily  (for prevention  of future gout attacks)  Prednisone  60 mg daily for 3 days,  then reduce by 10 mg daily until gone  No more colchicine for now,  use the prednisone instead, I  will refill if needed.      Crestor does not cause joint pain.  Some people develop MUSCLE PAIN.  Let me know if this happens

## 2022-07-04 NOTE — Assessment & Plan Note (Addendum)
Recurrent ,  will Increase allopurinol to 200 mg daily and start prednisone to prevent another flare. Refilling tramadol  Lab Results  Component Value Date   LABURIC 6.9 07/04/2022    Lab Results  Component Value Date   ESRSEDRATE 51 (H) 07/04/2022
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
## 2022-07-04 NOTE — Assessment & Plan Note (Signed)
He has been taking rosuvastatin intermittently because his pharmacist warned him that it could cause muscle pain., which he denies having

## 2022-07-04 NOTE — Assessment & Plan Note (Signed)
Not at goal.  Increase amlodipine to 10 mg and reduce metoprolol to 12.5 mg daily given reports of lethargy

## 2022-07-05 LAB — LIPID PANEL
Cholesterol: 196 mg/dL (ref 0–200)
HDL: 37.1 mg/dL — ABNORMAL LOW (ref >39.00)
LDL Cholesterol: 132 mg/dL — ABNORMAL HIGH (ref 0–99)
NonHDL: 158.48
Total CHOL/HDL Ratio: 5
Triglycerides: 133 mg/dL (ref 0.0–149.0)
VLDL: 26.6 mg/dL (ref 0.0–40.0)

## 2022-07-05 LAB — COMPREHENSIVE METABOLIC PANEL
ALT: 13 U/L (ref 0–53)
AST: 20 U/L (ref 0–37)
Albumin: 3.9 g/dL (ref 3.5–5.2)
Alkaline Phosphatase: 108 U/L (ref 39–117)
BUN: 8 mg/dL (ref 6–23)
CO2: 26 mEq/L (ref 19–32)
Calcium: 9.8 mg/dL (ref 8.4–10.5)
Chloride: 95 mEq/L — ABNORMAL LOW (ref 96–112)
Creatinine, Ser: 0.94 mg/dL (ref 0.40–1.50)
GFR: 88.26 mL/min (ref >60.00)
Glucose, Bld: 91 mg/dL (ref 70–99)
Potassium: 4.6 mEq/L (ref 3.5–5.1)
Sodium: 133 mEq/L — ABNORMAL LOW (ref 135–145)
Total Bilirubin: 0.5 mg/dL (ref 0.2–1.2)
Total Protein: 7.2 g/dL (ref 6.0–8.3)

## 2022-07-05 LAB — C-REACTIVE PROTEIN: CRP: 6.8 mg/dL (ref 0.5–20.0)

## 2022-07-05 LAB — CK: Total CK: 46 U/L (ref 7–232)

## 2022-07-05 LAB — SEDIMENTATION RATE: Sed Rate: 51 mm/hr — ABNORMAL HIGH (ref 0–20)

## 2022-07-05 LAB — URIC ACID: Uric Acid, Serum: 6.9 mg/dL (ref 4.0–7.8)

## 2022-07-08 ENCOUNTER — Encounter: Payer: Self-pay | Admitting: Internal Medicine

## 2022-07-10 ENCOUNTER — Telehealth: Payer: Self-pay

## 2022-07-10 DIAGNOSIS — Z79899 Other long term (current) drug therapy: Secondary | ICD-10-CM

## 2022-07-10 DIAGNOSIS — M25571 Pain in right ankle and joints of right foot: Secondary | ICD-10-CM

## 2022-07-10 DIAGNOSIS — M109 Gout, unspecified: Secondary | ICD-10-CM

## 2022-07-10 DIAGNOSIS — R42 Dizziness and giddiness: Secondary | ICD-10-CM

## 2022-07-10 NOTE — Telephone Encounter (Signed)
LMTCB in regards to lab results.  

## 2022-07-10 NOTE — Telephone Encounter (Signed)
-----   Message from Sherlene Shams, MD sent at 07/08/2022  5:41 PM EDT ----- Labs are consistent with uncontrolled gout. We  increased the allopurinol dose at his last visit to lower his uric acid .  Repeat labs in 3 months

## 2022-07-21 NOTE — Addendum Note (Signed)
Addended by: Sandy Salaam on: 07/21/2022 04:55 PM   Modules accepted: Orders

## 2022-07-21 NOTE — Addendum Note (Signed)
Addended by: Sherlene Shams on: 07/21/2022 09:13 PM   Modules accepted: Orders

## 2022-07-21 NOTE — Telephone Encounter (Signed)
Is the sed rate the only lab that needs to be repeated in 3 months?

## 2022-07-21 NOTE — Telephone Encounter (Signed)
Patient states he is returning our call.  I read message from Dr. Duncan Dull to patient.  I scheduled patient for a 8-month follow-up lab visit.  Patient states his balance issues have become more frequent.  Patient states he has taken more tumbles in the last two years than he has in the entire rest of his life.  Patient states his equilibrium seems to be more compromised.  Patient states his eyes are bothering him and his head feels foggy.  Patient states he has covered this with Dr. Duncan Dull.  Patient states Dr. Darrick Huntsman told him that he would need to have physical therapy before he could have the MRI.  I let patient know that he has been referred to physical therapy and I gave him their phone number because he states he thinks he may have missed their call.  Patient states he will call them.  Lab orders need to be entered.

## 2022-07-26 ENCOUNTER — Other Ambulatory Visit: Payer: Medicaid Other

## 2022-09-15 ENCOUNTER — Telehealth: Payer: Self-pay | Admitting: Internal Medicine

## 2022-09-15 MED ORDER — METOPROLOL SUCCINATE ER 25 MG PO TB24: 12.5000 mg | ORAL_TABLET | Freq: Every day | ORAL | 0 refills | Status: DC

## 2022-09-15 NOTE — Telephone Encounter (Signed)
Pt called in to let Dr. Darrick Huntsman know that his insurance wont pay for med metoprolol succinate (TOPROL-XL) 25 MG 24 hr tablet until Sept. Pt stated he only have about 2 pills left, and he wants to know if he can have like a 30 say supply for now until then?

## 2022-09-15 NOTE — Telephone Encounter (Signed)
Spoke with pt to let him know that he will have to pay for the 30 day supply out of pocket. While talking with pt we discovered that he is still taking the Metoprolol 25 mg instead of cutting it in half and taking just the 12.5 mg as discussed at last visit. I advised pt that is way he has ran out of his medication so quickly. I let pt know that I would send in a 30 day supply of the last rx that Dr. Darrick Huntsman sent in. Pt stated that he will try cutting it in half this time.

## 2022-09-18 NOTE — Telephone Encounter (Signed)
Spoke with pharmacy, pharmacy stated pt can get medication with good rx card and then insurance will pick up the next month supply. Pt was notified and verbalized understanding.

## 2022-09-18 NOTE — Telephone Encounter (Signed)
Pt called stating he would like for [place] to resend the adjusted [place] to his insurance company

## 2022-09-26 ENCOUNTER — Other Ambulatory Visit: Payer: Medicaid Other

## 2022-10-13 ENCOUNTER — Encounter: Payer: Self-pay | Admitting: Nurse Practitioner

## 2022-10-13 ENCOUNTER — Ambulatory Visit: Payer: Medicaid Other | Admitting: Nurse Practitioner

## 2022-10-13 VITALS — BP 140/80 | HR 87 | Wt 213.2 lb

## 2022-10-13 DIAGNOSIS — R42 Dizziness and giddiness: Secondary | ICD-10-CM | POA: Diagnosis not present

## 2022-10-13 DIAGNOSIS — M109 Gout, unspecified: Secondary | ICD-10-CM

## 2022-10-13 DIAGNOSIS — Z79899 Other long term (current) drug therapy: Secondary | ICD-10-CM

## 2022-10-13 DIAGNOSIS — M25571 Pain in right ankle and joints of right foot: Secondary | ICD-10-CM

## 2022-10-13 DIAGNOSIS — R002 Palpitations: Secondary | ICD-10-CM | POA: Diagnosis not present

## 2022-10-13 DIAGNOSIS — I10 Essential (primary) hypertension: Secondary | ICD-10-CM

## 2022-10-13 LAB — URIC ACID: Uric Acid, Serum: 5.2 mg/dL (ref 4.0–7.8)

## 2022-10-13 LAB — B12 AND FOLATE PANEL
Folate: 7.5 ng/mL (ref >5.9)
Vitamin B-12: 227 pg/mL (ref 211–911)

## 2022-10-13 LAB — COMPREHENSIVE METABOLIC PANEL
ALT: 45 U/L (ref 0–53)
AST: 41 U/L — ABNORMAL HIGH (ref 0–37)
Albumin: 3.9 g/dL (ref 3.5–5.2)
Alkaline Phosphatase: 84 U/L (ref 39–117)
BUN: 8 mg/dL (ref 6–23)
CO2: 29 mEq/L (ref 19–32)
Calcium: 9.3 mg/dL (ref 8.4–10.5)
Chloride: 99 mEq/L (ref 96–112)
Creatinine, Ser: 0.9 mg/dL (ref 0.40–1.50)
GFR: 92.81 mL/min (ref >60.00)
Glucose, Bld: 112 mg/dL — ABNORMAL HIGH (ref 70–99)
Potassium: 4.3 mEq/L (ref 3.5–5.1)
Sodium: 134 mEq/L — ABNORMAL LOW (ref 135–145)
Total Bilirubin: 0.6 mg/dL (ref 0.2–1.2)
Total Protein: 7.2 g/dL (ref 6.0–8.3)

## 2022-10-13 LAB — SEDIMENTATION RATE: Sed Rate: 18 mm/hr (ref 0–20)

## 2022-10-13 MED ORDER — BLOOD PRESSURE MONITOR & DTX KIT
PACK | 0 refills | Status: DC
Start: 2022-10-13 — End: 2023-11-21

## 2022-10-13 NOTE — Progress Notes (Signed)
Established Patient Office Visit  Subjective:  Patient ID: Jeff [name], male    DOB: 07-Nov-1960  Age: 62 y.o. MRN: [id number]  CC:  Chief Complaint  Patient presents with   Follow-up    Follow up on labs and BP.     HPI  [name] presents to discuss the BP. He has not been checking his BP at home. He has been experiencing palpitation intermittently that last for 15 to 30 minutes. Patient states that the palpitations occur randomly.  [name] SOB but have chest tightness off and on.   HPI   Past Medical History:  Diagnosis Date   Arthritis    KNEES   Cataract    REMOVED BILATERAL   Claustrophobia    Displaced fracture of base of third metacarpal bone, right hand, initial encounter for closed fracture 08/29/2016   GERD (gastroesophageal reflux disease)    with certain foods   Glaucoma (increased eye pressure)    Headache(784.0)    07/11/13- none in a long time   Hepatic steatosis    Hyperglycemia    Hyperlipidemia    Hypertension    no meds   Sleep apnea     Past Surgical History:  Procedure Laterality Date   CATARACT EXTRACTION W/PHACO Left 07/16/2013   Procedure: CATARACT EXTRACTION PHACO AND INTRAOCULAR LENS PLACEMENT (IOC) LEFT EYE;  Surgeon: Chalmers Guest, MD;  Location: [facility] OR;  Service: Ophthalmology;  Laterality: Left;   CATARACT EXTRACTION W/PHACO Right 10/29/2013   Procedure: CATARACT EXTRACTION PHACO AND INTRAOCULAR LENS PLACEMENT (IOC);  Surgeon: Chalmers Guest, MD;  Location: [facility] OR;  Service: Ophthalmology;  Laterality: Right;   EYE SURGERY     2 on left eye   FINGER SURGERY Right    MINI SHUNT INSERTION  03/09/2011   Procedure: INSERTION OF MINI SHUNT;  Surgeon: Chalmers Guest, MD;  Location: [facility] OR;  Service: Ophthalmology;  Laterality: Left;    Family History  Problem Relation Age of Onset   Asthma Mother    Hyperlipidemia Mother    Heart disease Mother    Hypertension Mother    Liver cancer Mother    Thyroid disease Mother    Asthma  Father    Hyperlipidemia Father    Heart disease Father    Stroke Father    Hypertension Father    Aneurysm Father    Aneurysm Brother        non smoker    Early death Brother    Asthma Paternal Aunt    Heart disease Sister    Early death Sister    Migraines Neg Hx     Social History   Socioeconomic History   Marital status: Divorced    Spouse name: Not on file   Number of children: 0   Years of education: college3   Highest education level: Not on file  Occupational History   Occupation: Aeronautical engineer: [employer]  Tobacco Use   Smoking status: Never   Smokeless tobacco: Never  Vaping Use   Vaping status: Never Used  Substance and Sexual Activity   Alcohol use: Yes    Alcohol/week: 2.0 standard drinks of alcohol    Types: 2 Cans of beer per week    Comment: daily   Drug use: No   Sexual activity: Not on file  Other Topics Concern   Not on file  Social History Narrative   Not on file   Social Determinants of Health   Financial Resource Strain:  Not on file  Food Insecurity: Not on file  Transportation Needs: Not on file  Physical Activity: Unknown (01/08/2017)   [facility], [facility]   Exercise Vital Sign    Days of Exercise per Week: 2 days    Minutes of Exercise per Session: Not on file  Stress: Not on file  Social Connections: Not on file  Intimate Partner Violence: Not on file     Outpatient Medications Prior to Visit  Medication Sig Dispense Refill   amLODipine (NORVASC) 10 MG tablet Take 1 tablet (10 mg total) by mouth daily. 90 tablet 1   colchicine 0.6 MG tablet TAKE 1 TABLET BY MOUTH HOURLY UNTIL GOUT IMPROVED/DIARRHEA,ON 1ST DAY OF TREATMENT,THEN 1 TAB DAILY THEREAFTER IF NEEDED 30 tablet 8   hydrochlorothiazide (HYDRODIURIL) 25 MG tablet Take 1 tablet (25 mg total) by mouth daily. 30 tablet 11   metoprolol succinate (TOPROL-XL) 25 MG 24 hr tablet Take 0.5 tablets (12.5 mg total) by mouth  daily. NEEDS APPT FOR ADDITIONAL REFILLS 15 tablet 0   pantoprazole (PROTONIX) 40 MG tablet Take 1 tablet (40 mg total) by mouth 2 (two) times daily. 180 tablet 1   prednisoLONE acetate (PRED FORTE) 1 % ophthalmic suspension INSTILL 1 DROP IN BOTH EYES 4 TIMES DAILY     rosuvastatin (CRESTOR) 20 MG tablet Take 1 tablet (20 mg total) by mouth daily. 30 tablet 11   sertraline (ZOLOFT) 50 MG tablet Take 1 tablet (50 mg total) by mouth daily. 90 tablet 1   traMADol (ULTRAM) 50 MG tablet Take 1 tablet (50 mg total) by mouth every 6 (six) hours as needed. 90 tablet 5   predniSONE (DELTASONE) 10 MG tablet 6 tablets daily for 3 days, then reduce by 1 tablet daily until gone (Patient not taking: Reported on 10/13/2022) 33 tablet 0   allopurinol 200 MG TABS Take 200 mg by mouth daily. 90 tablet 1   predniSONE (DELTASONE) 10 MG tablet 6 tablets daily for 3 days, then reduce by 1 tablet daily until gone (Patient not taking: Reported on 07/04/2022) 33 tablet 0   No facility-administered medications prior to visit.    Allergies  Allergen Reactions   Keflex [Cephalexin] Rash    ROS Review of Systems Negative unless indicated in HPI.    Objective:    Physical Exam Constitutional:      Appearance: Normal appearance.  HENT:     Nose: Nose normal.     Mouth/Throat:     Mouth: Mucous membranes are moist.  Eyes:     Extraocular Movements: Extraocular movements intact.     Pupils: Pupils are equal, round, and reactive to light.  Cardiovascular:     Rate and Rhythm: Normal rate and regular rhythm.     Pulses: Normal pulses.     Heart sounds: Normal heart sounds.  Abdominal:     Tenderness: There is no abdominal tenderness.  Musculoskeletal:     Cervical back: Normal range of motion.  Neurological:     General: No focal deficit present.     Mental Status: He is alert. Mental status is at baseline.  Psychiatric:        Mood and Affect: Mood normal.        Behavior: Behavior normal.         Thought Content: Thought content normal.        Judgment: Judgment normal.     BP (!) 140/80   Pulse 87  Wt 213 lb 3.2 oz (96.7 kg)   SpO2 96%   BMI 30.59 kg/m  Wt Readings from Last 3 Encounters:  10/13/22 213 lb 3.2 oz (96.7 kg)  07/04/22 183 lb (83 kg)  04/25/22 191 lb 3.2 oz (86.7 kg)     Health Maintenance  Topic Date Due   Zoster Vaccines- Shingrix (1 of 2) Never done   DTaP/Tdap/Td (2 - Td or Tdap) 03/21/2021   HEMOGLOBIN A1C  09/22/2022   COVID-19 Vaccine (4 - 2023-24 season) 10/22/2022   INFLUENZA VACCINE  05/21/2023 (Originally 09/21/2022)   Diabetic kidney evaluation - Urine ACR  03/25/2023   Diabetic kidney evaluation - eGFR measurement  10/13/2023   Colonoscopy  10/08/2024   Hepatitis C Screening  Completed   HIV Screening  Completed   HPV VACCINES  Aged Out    There are no preventive care reminders to display for this patient.  Lab Results  Component Value Date   TSH 2.27 03/24/2022   Lab Results  Component Value Date   WBC 5.7 03/24/2022   HGB 14.7 03/24/2022   HCT 42.9 03/24/2022   MCV 95.2 03/24/2022   PLT 307.0 03/24/2022   Lab Results  Component Value Date   NA 134 (L) 10/13/2022   K 4.3 10/13/2022   CO2 29 10/13/2022   GLUCOSE 112 (H) 10/13/2022   BUN 8 10/13/2022   CREATININE 0.90 10/13/2022   BILITOT 0.6 10/13/2022   ALKPHOS 84 10/13/2022   AST 41 (H) 10/13/2022   ALT 45 10/13/2022   PROT 7.2 10/13/2022   ALBUMIN 3.9 10/13/2022   CALCIUM 9.3 10/13/2022   ANIONGAP 7 03/01/2018   GFR 92.81 10/13/2022   Lab Results  Component Value Date   CHOL 196 07/04/2022   Lab Results  Component Value Date   HDL 37.10 (L) 07/04/2022   Lab Results  Component Value Date   LDLCALC 132 (H) 07/04/2022   Lab Results  Component Value Date   TRIG 133.0 07/04/2022   Lab Results  Component Value Date   CHOLHDL 5 07/04/2022   Lab Results  Component Value Date   HGBA1C 5.9 03/24/2022      Assessment & Plan:  Palpitations Assessment  & Plan: EKG reassuring. Would refer to cardiology for further evaluation.  Orders: -     EKG 12-Lead -     Ambulatory referral to Cardiology  Dizziness of unknown cause -     B12 and Folate Panel  Long-term use of high-risk medication -     Comprehensive metabolic panel  Gouty arthritis -     Uric acid  Acute right ankle pain -     Sedimentation rate  Primary hypertension Assessment & Plan: Patient BP  Vitals:   10/13/22 1150 10/13/22 1203  BP: (!) 150/90 (!) 140/80  Advised pt to follow a low sodium and heart healthy diet. Continue the current medication regimen. Patient do not have a blood pressure cuff at home. Prescription ordered for  blood pressure cuff Advised patient to check blood pressure at home and send [place] the blood pressure readings in 1 week.   Orders: -     Blood Pressure Monitor & DTx; Check blood pressure once a day  Dispense: 1 kit; Refill: 0    Follow-up: Return if symptoms worsen or fail to improve.   Kara Dies, NP

## 2022-10-13 NOTE — Patient Instructions (Signed)
Referral sent to cardiology. Please check the blood pressure at home and send [place] the readings in 1 week.

## 2022-10-15 ENCOUNTER — Other Ambulatory Visit: Payer: Self-pay | Admitting: Internal Medicine

## 2022-10-15 MED ORDER — ALLOPURINOL 200 MG PO TABS: 200.0000 mg | ORAL_TABLET | Freq: Every day | ORAL | 1 refills | Status: DC

## 2022-10-16 ENCOUNTER — Telehealth: Payer: Self-pay

## 2022-10-16 NOTE — Telephone Encounter (Signed)
Pt returned Shanda Bumps CMA call. Note below from provider was read to him. Pt aware and understood, however, pt has additional questions, about the rest of his lab results. Unable to transfer. Pt will like a call back.

## 2022-10-16 NOTE — Telephone Encounter (Signed)
-----   Message from Sherlene Shams sent at 10/15/2022  2:04 PM EDT ----- The uric acid level and the inflammatory markers for gout are now normal.  Continue allopurinol .  Refill sent

## 2022-10-16 NOTE — Telephone Encounter (Signed)
LMTCB in regards to lab results.  

## 2022-10-17 NOTE — Telephone Encounter (Signed)
Spoke with pt in regards to the rest of his lab results. Pt gave a verbal understanding.

## 2022-10-23 ENCOUNTER — Encounter: Payer: Self-pay | Admitting: Nurse Practitioner

## 2022-10-23 DIAGNOSIS — R002 Palpitations: Secondary | ICD-10-CM | POA: Insufficient documentation

## 2022-10-23 NOTE — Assessment & Plan Note (Addendum)
Patient BP  Vitals:   10/13/22 1150 10/13/22 1203  BP: (!) 150/90 (!) 140/80  Advised pt to follow a low sodium and heart healthy diet. Continue the current medication regimen. Patient do not have a blood pressure cuff at home. Prescription ordered for  blood pressure cuff Advised patient to check blood pressure at home and send [place] the blood pressure readings in 1 week.

## 2022-10-23 NOTE — Assessment & Plan Note (Signed)
EKG reassuring. Would refer to cardiology for further evaluation.

## 2022-11-08 ENCOUNTER — Other Ambulatory Visit: Payer: Self-pay | Admitting: Internal Medicine

## 2022-11-09 ENCOUNTER — Other Ambulatory Visit: Payer: Self-pay | Admitting: Internal Medicine

## 2022-11-13 ENCOUNTER — Ambulatory Visit: Payer: Medicaid Other | Admitting: Internal Medicine

## 2022-11-14 ENCOUNTER — Ambulatory Visit: Payer: Medicaid Other | Admitting: Internal Medicine

## 2022-11-20 ENCOUNTER — Ambulatory Visit: Payer: Medicaid Other | Admitting: Internal Medicine

## 2022-11-20 ENCOUNTER — Encounter: Payer: Self-pay | Admitting: Internal Medicine

## 2022-11-20 VITALS — BP 160/94 | HR 93 | Ht 70.0 in | Wt 224.0 lb

## 2022-11-20 DIAGNOSIS — R002 Palpitations: Secondary | ICD-10-CM | POA: Diagnosis not present

## 2022-11-20 DIAGNOSIS — M109 Gout, unspecified: Secondary | ICD-10-CM

## 2022-11-20 DIAGNOSIS — G4452 New daily persistent headache (NDPH): Secondary | ICD-10-CM

## 2022-11-20 DIAGNOSIS — F321 Major depressive disorder, single episode, moderate: Secondary | ICD-10-CM | POA: Diagnosis not present

## 2022-11-20 DIAGNOSIS — G253 Myoclonus: Secondary | ICD-10-CM | POA: Diagnosis not present

## 2022-11-20 DIAGNOSIS — F5104 Psychophysiologic insomnia: Secondary | ICD-10-CM

## 2022-11-20 DIAGNOSIS — E66811 Obesity, class 1: Secondary | ICD-10-CM

## 2022-11-20 DIAGNOSIS — I2510 Atherosclerotic heart disease of native coronary artery without angina pectoris: Secondary | ICD-10-CM

## 2022-11-20 DIAGNOSIS — I159 Secondary hypertension, unspecified: Secondary | ICD-10-CM

## 2022-11-20 DIAGNOSIS — H5440 Blindness, one eye, unspecified eye: Secondary | ICD-10-CM

## 2022-11-20 DIAGNOSIS — R0683 Snoring: Secondary | ICD-10-CM

## 2022-11-20 DIAGNOSIS — R296 Repeated falls: Secondary | ICD-10-CM

## 2022-11-20 DIAGNOSIS — H401190 Primary open-angle glaucoma, unspecified eye, stage unspecified: Secondary | ICD-10-CM

## 2022-11-20 DIAGNOSIS — R490 Dysphonia: Secondary | ICD-10-CM

## 2022-11-20 DIAGNOSIS — I2584 Coronary atherosclerosis due to calcified coronary lesion: Secondary | ICD-10-CM

## 2022-11-20 MED ORDER — AMLODIPINE BESYLATE 5 MG PO TABS: 5.0000 mg | ORAL_TABLET | Freq: Every day | ORAL | 1 refills | Status: DC

## 2022-11-20 MED ORDER — SERTRALINE HCL 50 MG PO TABS: 50.0000 mg | ORAL_TABLET | Freq: Every day | ORAL | 1 refills | Status: DC

## 2022-11-20 MED ORDER — HYDROCHLOROTHIAZIDE 25 MG PO TABS: 25.0000 mg | ORAL_TABLET | Freq: Every day | ORAL | 11 refills | Status: DC

## 2022-11-20 MED ORDER — CARVEDILOL 6.25 MG PO TABS: 6.2500 mg | ORAL_TABLET | Freq: Two times a day (BID) | ORAL | 3 refills | Status: DC

## 2022-11-20 NOTE — Assessment & Plan Note (Signed)
No flares since the Increase in  allopurinol to 200 mg daily hs resulted in  lowered uric acid level.  Lab Results  Component Value Date   LABURIC 5.2 10/13/2022    Lab Results  Component Value Date   ESRSEDRATE 18 10/13/2022
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
## 2022-11-20 NOTE — Assessment & Plan Note (Signed)
Accompanied by chest discomfort,  with activity.  Cardiology referral is planned for OCT 10.  ZIO monitor ordered
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
## 2022-11-20 NOTE — Assessment & Plan Note (Signed)
He has resumed sertraline 50  mg daily

## 2022-11-20 NOTE — Patient Instructions (Addendum)
MEDICATION CHANGES:  REDUCE AMLODIPINE TO 5 MG DAILY  (NEW RX SENT ) STOP METOPROLOL START CARVEDILOL ONE TABLET TWICE DAILY RESUME hydrochlorothiazide  ONCE DAILY IN THE MORNING   ZIO MONITOR ORDERED TO MONITOR YOUR HEART RATE AND RHYTHM .  IT WILL BE DELIVERED TO YOUR HOME  HOME SLEEP STUDY HAS ALSO BEEN ORDERED   KEEP THE APPOINTMENT WITH DR Arlys John Sharlet [place] IN [place] ON OCT 11 (CARDIOLOGY)

## 2022-11-20 NOTE — Assessment & Plan Note (Signed)
Accompanied by cognitive concerns , weight gain, snoring and hypertension.  Rule out OSA

## 2022-11-20 NOTE — Assessment & Plan Note (Signed)
Not at goal . Changing metoprolol to carvedilol

## 2022-11-21 ENCOUNTER — Ambulatory Visit: Payer: Medicaid Other | Attending: Internal Medicine

## 2022-11-21 DIAGNOSIS — R002 Palpitations: Secondary | ICD-10-CM

## 2022-11-21 LAB — COMPREHENSIVE METABOLIC PANEL
ALT: 91 U/L — ABNORMAL HIGH (ref 0–53)
AST: 71 U/L — ABNORMAL HIGH (ref 0–37)
Albumin: 3.9 g/dL (ref 3.5–5.2)
Alkaline Phosphatase: 86 U/L (ref 39–117)
BUN: 12 mg/dL (ref 6–23)
CO2: 27 meq/L (ref 19–32)
Calcium: 9.1 mg/dL (ref 8.4–10.5)
Chloride: 100 meq/L (ref 96–112)
Creatinine, Ser: 0.92 mg/dL (ref 0.40–1.50)
GFR: 90.33 mL/min (ref >60.00)
Glucose, Bld: 104 mg/dL — ABNORMAL HIGH (ref 70–99)
Potassium: 4.2 meq/L (ref 3.5–5.1)
Sodium: 136 meq/L (ref 135–145)
Total Bilirubin: 0.5 mg/dL (ref 0.2–1.2)
Total Protein: 6.7 g/dL (ref 6.0–8.3)

## 2022-11-21 LAB — SEDIMENTATION RATE: Sed Rate: 17 mm/h (ref 0–20)

## 2022-11-21 LAB — LIPID PANEL W/REFLEX DIRECT LDL
Cholesterol: 236 mg/dL — ABNORMAL HIGH (ref <200)
HDL: 67 mg/dL (ref >=40)
LDL Cholesterol (Calc): 132 mg/dL — ABNORMAL HIGH
Non-HDL Cholesterol (Calc): 169 mg/dL — ABNORMAL HIGH (ref <130)
Total CHOL/HDL Ratio: 3.5 (calc) (ref <5.0)
Triglycerides: 227 mg/dL — ABNORMAL HIGH (ref <150)

## 2022-11-22 ENCOUNTER — Telehealth: Payer: Self-pay

## 2022-11-22 NOTE — Telephone Encounter (Signed)
-----   Message from Sherlene Shams sent at 11/21/2022 10:16 PM EDT ----- Your liver enzymes are elevated (due to fatty liver) and your cholesterol is elevated as well.  The sedimentation rate is again normal .  This was done to make sure that the cause of your headaches  was not an inflammation of your artery.  Please confirm that you have resumed regular use of rosuvstatin   Regards,   Duncan Dull, MD

## 2022-11-22 NOTE — Telephone Encounter (Signed)
Patient returned office phone call and note was read. He states he is on his rosuvstatin . Patient wanted to ask Shanda Bumps about some medications, call was transferred to [place].

## 2022-11-22 NOTE — Telephone Encounter (Signed)
LMTCB in regards to lab results.  

## 2022-12-01 ENCOUNTER — Ambulatory Visit: Payer: Medicaid Other | Admitting: Cardiology

## 2022-12-07 ENCOUNTER — Telehealth: Payer: Self-pay | Admitting: Internal Medicine

## 2022-12-07 NOTE — Telephone Encounter (Signed)
Spoke with Judeth Cornfield with [facility], she stated that pt's bp was 160/110. Pt is asymptomatic. She did state that pt is taking the Carvedilol 6.25 mg BID, hydrochlorothiazide 25 mg daily, he stopped the amlodipine 5 mg and restarted the losartan 100 mg daily. She stated that he stopped the amlodipine because he was having palpitations and lower extremity swelling.   Verbal orders were given

## 2022-12-07 NOTE — Telephone Encounter (Signed)
[facility] called and would like to report a BP reading 160/110 asymptomatic.  And needing verbal order 1 a week for two week Then every other week for 6 weeks  Social worker evaluation     Please call [phone]

## 2022-12-11 NOTE — Telephone Encounter (Signed)
LMTCB with Judeth Cornfield

## 2022-12-17 NOTE — Progress Notes (Deleted)
Cardiology Office Note:    Date:  12/17/2022   ID:  Jeff [name], DOB Jan 10, 1961, MRN [id number]  PCP:  Sherlene Shams, MD  Cardiologist:  Olga Millers, MD  Electrophysiologist:  None   Referring MD: Kara Dies, NP   No chief complaint on file. ***  History of Present Illness:    Jeff [name] is a 62 y.o. male with a hx of hypertension, hyperlipidemia, OSA who is referred by Kara Dies, NP for evaluation of palpitations.  Past Medical History:  Diagnosis Date   Arthritis    KNEES   Cataract    REMOVED BILATERAL   Claustrophobia    Displaced fracture of base of third metacarpal bone, right hand, initial encounter for closed fracture 08/29/2016   GERD (gastroesophageal reflux disease)    with certain foods   Glaucoma (increased eye pressure)    Headache(784.0)    07/11/13- none in a long time   Hepatic steatosis    Hyperglycemia    Hyperlipidemia    Hypertension    no meds   Sleep apnea     Past Surgical History:  Procedure Laterality Date   CATARACT EXTRACTION W/PHACO Left 07/16/2013   Procedure: CATARACT EXTRACTION PHACO AND INTRAOCULAR LENS PLACEMENT (IOC) LEFT EYE;  Surgeon: Chalmers Guest, MD;  Location: [facility] OR;  Service: Ophthalmology;  Laterality: Left;   CATARACT EXTRACTION W/PHACO Right 10/29/2013   Procedure: CATARACT EXTRACTION PHACO AND INTRAOCULAR LENS PLACEMENT (IOC);  Surgeon: Chalmers Guest, MD;  Location: [facility] OR;  Service: Ophthalmology;  Laterality: Right;   EYE SURGERY     2 on left eye   FINGER SURGERY Right    MINI SHUNT INSERTION  03/09/2011   Procedure: INSERTION OF MINI SHUNT;  Surgeon: Chalmers Guest, MD;  Location: [facility] OR;  Service: Ophthalmology;  Laterality: Left;    Current Medications: No outpatient medications have been marked as taking for the 12/18/22 encounter (Appointment) with Little Ishikawa, MD.     Allergies:   Keflex [cephalexin]   Social History   Socioeconomic History   Marital status: Divorced     Spouse name: Not on file   Number of children: 0   Years of education: college3   Highest education level: Not on file  Occupational History   Occupation: Aeronautical engineer: [employer]  Tobacco Use   Smoking status: Never   Smokeless tobacco: Never  Vaping Use   Vaping status: Never Used  Substance and Sexual Activity   Alcohol use: Yes    Alcohol/week: 2.0 standard drinks of alcohol    Types: 2 Cans of beer per week    Comment: daily   Drug use: No   Sexual activity: Not on file  Other Topics Concern   Not on file  Social History Narrative   Not on file   Social Determinants of Health   Financial Resource Strain: Not on file  Food Insecurity: Not on file  Transportation Needs: Not on file  Physical Activity: Unknown (01/08/2017)   [facility]   Exercise Vital Sign    Days of Exercise per Week: 2 days    Minutes of Exercise per Session: Not on file  Stress: Not on file  Social Connections: Not on file     Family History: The patient's ***family history includes Aneurysm in his brother and father; Asthma in his father, mother, and paternal aunt; Early death in his brother and sister; Heart disease in his  father, mother, and sister; Hyperlipidemia in his father and mother; Hypertension in his father and mother; Liver cancer in his mother; Stroke in his father; Thyroid disease in his mother. There is no history of Migraines.  ROS:   Please see the history of present illness.    *** All other systems reviewed and are negative.  EKGs/Labs/Other Studies Reviewed:    The following studies were reviewed today: ***  EKG:  EKG is *** ordered today.  The ekg ordered today demonstrates ***  Recent Labs: 03/24/2022: Hemoglobin 14.7; Platelets 307.0; TSH 2.27 11/20/2022: ALT 91; BUN 12; Creatinine, Ser 0.92; Potassium 4.2; Sodium 136  Recent Lipid Panel    Component Value Date/Time   CHOL 236 (H) 11/20/2022 1351    CHOL 179 07/23/2019 1447   TRIG 227 (H) 11/20/2022 1351   HDL 67 11/20/2022 1351   HDL 68 07/23/2019 1447   CHOLHDL 3.5 11/20/2022 1351   VLDL 26.6 07/04/2022 1451   LDLCALC 132 (H) 11/20/2022 1351   LDLDIRECT 150.0 03/24/2022 1019    Physical Exam:    VS:  There were no vitals taken for this visit.    Wt Readings from Last 3 Encounters:  11/20/22 224 lb (101.6 kg)  10/13/22 213 lb 3.2 oz (96.7 kg)  07/04/22 183 lb (83 kg)     GEN: *** Well nourished, well developed in no acute distress HEENT: Normal NECK: No JVD; No carotid bruits LYMPHATICS: No lymphadenopathy CARDIAC: ***RRR, no murmurs, rubs, gallops RESPIRATORY:  Clear to auscultation without rales, wheezing or rhonchi  ABDOMEN: Soft, non-tender, non-distended MUSCULOSKELETAL:  No edema; No deformity  SKIN: Warm and dry NEUROLOGIC:  Alert and oriented x 3 PSYCHIATRIC:  Normal affect   ASSESSMENT:    No diagnosis found. PLAN:    Palpitations:  Hypertension: On amlodipine 5 mg daily and carvedilol 6.25 mg twice daily  Hyperlipidemia: On rosuvastatin 20 mg daily  OSA:  RTC in***   Medication Adjustments/Labs and Tests Ordered: Current medicines are reviewed at length with the patient today.  Concerns regarding medicines are outlined above.  No orders of the defined types were placed in this encounter.  No orders of the defined types were placed in this encounter.   There are no Patient Instructions on file for this visit.   Signed, Little Ishikawa, MD  12/17/2022 9:53 PM    [facility]

## 2022-12-18 ENCOUNTER — Ambulatory Visit: Payer: Medicaid Other | Admitting: Cardiology

## 2022-12-19 ENCOUNTER — Other Ambulatory Visit: Payer: Self-pay | Admitting: Internal Medicine

## 2022-12-19 ENCOUNTER — Telehealth: Payer: Self-pay | Admitting: Internal Medicine

## 2022-12-19 MED ORDER — LOSARTAN POTASSIUM 100 MG PO TABS: 100.0000 mg | ORAL_TABLET | Freq: Every day | ORAL | 1 refills | Status: DC

## 2022-12-21 NOTE — Telephone Encounter (Signed)
Pt would like to be called regarding his prednisone

## 2022-12-22 NOTE — Telephone Encounter (Signed)
Spoke with pt and he stated that he was able to get the cholchine refilled yesterday. Pt scheduled a virtual visit on Monday at 5:15.

## 2022-12-25 ENCOUNTER — Encounter: Payer: Self-pay | Admitting: Internal Medicine

## 2022-12-25 ENCOUNTER — Telehealth: Payer: Self-pay

## 2022-12-25 ENCOUNTER — Telehealth: Payer: Medicaid Other | Admitting: Internal Medicine

## 2022-12-25 ENCOUNTER — Other Ambulatory Visit: Payer: Self-pay | Admitting: Internal Medicine

## 2022-12-25 VITALS — Ht 70.0 in | Wt 224.0 lb

## 2022-12-25 DIAGNOSIS — M109 Gout, unspecified: Secondary | ICD-10-CM

## 2022-12-25 MED ORDER — TRAMADOL HCL 50 MG PO TABS: 50.0000 mg | ORAL_TABLET | Freq: Four times a day (QID) | ORAL | 2 refills | Status: DC | PRN

## 2022-12-25 MED ORDER — HYDROCODONE-ACETAMINOPHEN 5-325 MG PO TABS: 1.0000 | ORAL_TABLET | Freq: Four times a day (QID) | ORAL | 0 refills | Status: AC | PRN

## 2022-12-25 NOTE — Progress Notes (Unsigned)
Virtual Visit converted to Telephone    Note   This format is felt to be most appropriate for this patient at this time.  All issues noted in this document were discussed and addressed.  No physical exam was performed (except for noted visual exam findings with Video Visits).   I attempted to connect with Jeff [name]  on 12/25/22 at  5:15 PM EST by a video enabled telemedicine application and verified that I am speaking with the correct person using two identifiers. Location patient: home Location provider: work or home office Persons participating in the virtual visit: patient, provider  I discussed the limitations, risks, security and privacy concerns of performing an evaluation and management service by telephone and the availability of in person appointments. I also discussed with the patient that there may be a patient responsible charge related to this service. The patient expressed understanding and agreed to proceed.  Interactive audio and video telecommunications were attempted between this provider and patient, however failed, due to patient having technical difficulties   We continued and completed visit with audio only.   Reason for visit: recurrent gout   HPI:  Gout:  he developed pain warmth and redness of both knees several days ago and requested a refill of prednisone which was denied.  Colchicine was refilled but he has not picked it up .  His tramadol refill was also denied by his insurance  for unclear reasons (he received a text from pharmacy)   I called and spoke with pharmacist: the reason for tramadol denial was need for PA for chronic use Refill history confirmed via [place] Controlled Substance databas, accessed by me today.. last refill was August 24    ROS: See pertinent positives and negatives per HPI.  Past Medical History:  Diagnosis Date   Arthritis    KNEES   Cataract    REMOVED BILATERAL   Claustrophobia    Displaced fracture of base of third metacarpal bone,  right hand, initial encounter for closed fracture 08/29/2016   GERD (gastroesophageal reflux disease)    with certain foods   Glaucoma (increased eye pressure)    Headache(784.0)    07/11/13- none in a long time   Hepatic steatosis    Hyperglycemia    Hyperlipidemia    Hypertension    no meds   Sleep apnea     Past Surgical History:  Procedure Laterality Date   CATARACT EXTRACTION W/PHACO Left 07/16/2013   Procedure: CATARACT EXTRACTION PHACO AND INTRAOCULAR LENS PLACEMENT (IOC) LEFT EYE;  Surgeon: Chalmers Guest, MD;  Location: [facility] OR;  Service: Ophthalmology;  Laterality: Left;   CATARACT EXTRACTION W/PHACO Right 10/29/2013   Procedure: CATARACT EXTRACTION PHACO AND INTRAOCULAR LENS PLACEMENT (IOC);  Surgeon: Chalmers Guest, MD;  Location: [facility] OR;  Service: Ophthalmology;  Laterality: Right;   EYE SURGERY     2 on left eye   FINGER SURGERY Right    MINI SHUNT INSERTION  03/09/2011   Procedure: INSERTION OF MINI SHUNT;  Surgeon: Chalmers Guest, MD;  Location: [facility] OR;  Service: Ophthalmology;  Laterality: Left;    Family History  Problem Relation Age of Onset   Asthma Mother    Hyperlipidemia Mother    Heart disease Mother    Hypertension Mother    Liver cancer Mother    Thyroid disease Mother    Asthma Father    Hyperlipidemia Father    Heart disease Father    Stroke Father    Hypertension Father    Aneurysm  Father    Aneurysm Brother        non smoker    Early death Brother    Asthma Paternal Aunt    Heart disease Sister    Early death Sister    Migraines Neg Hx     SOCIAL HX:  reports that he has never smoked. He has never used smokeless tobacco. He reports current alcohol use of about 2.0 standard drinks of alcohol per week. He reports that he does not use drugs.    Current Outpatient Medications:    Allopurinol 200 MG TABS, Take 200 mg by mouth daily., Disp: 90 tablet, Rfl: 1   amLODipine (NORVASC) 5 MG tablet, Take 1 tablet (5 mg total) by mouth daily., Disp: 90 tablet,  Rfl: 1   Blood Pressure Monitoring (BLOOD PRESSURE MONITOR & DTX) KIT, Check blood pressure once a day, Disp: 1 kit, Rfl: 0   carvedilol (COREG) 6.25 MG tablet, Take 1 tablet (6.25 mg total) by mouth 2 (two) times daily with a meal., Disp: 60 tablet, Rfl: 3   colchicine 0.6 MG tablet, TAKE 1 TABLET BY MOUTH HOURLY UNTIL GOUT IMPROVED/DIARRHEA,ON 1ST DAY OF TREATMENT,THEN 1 TAB DAILY THEREAFTER IF NEEDED, Disp: 30 tablet, Rfl: 8   hydrochlorothiazide (HYDRODIURIL) 25 MG tablet, Take 1 tablet (25 mg total) by mouth daily., Disp: 30 tablet, Rfl: 11   HYDROcodone-acetaminophen (NORCO/VICODIN) 5-325 MG tablet, Take 1 tablet by mouth every 6 (six) hours as needed for up to 5 days for moderate pain (pain score 4-6)., Disp: 20 tablet, Rfl: 0   losartan (COZAAR) 100 MG tablet, Take 1 tablet (100 mg total) by mouth daily., Disp: 90 tablet, Rfl: 1   pantoprazole (PROTONIX) 40 MG tablet, TAKE 1 TABLET BY MOUTH TWICE A DAY, Disp: 180 tablet, Rfl: 1   prednisoLONE acetate (PRED FORTE) 1 % ophthalmic suspension, INSTILL 1 DROP IN BOTH EYES 4 TIMES DAILY, Disp: , Rfl:    predniSONE (DELTASONE) 10 MG tablet, 6 tablets daily for 3 days, then reduce by 1 tablet daily until gone, Disp: 33 tablet, Rfl: 0   rosuvastatin (CRESTOR) 20 MG tablet, Take 1 tablet (20 mg total) by mouth daily., Disp: 30 tablet, Rfl: 11   sertraline (ZOLOFT) 50 MG tablet, Take 1 tablet (50 mg total) by mouth daily., Disp: 90 tablet, Rfl: 1   traMADol (ULTRAM) 50 MG tablet, Take 1 tablet (50 mg total) by mouth every 6 (six) hours as needed., Disp: 90 tablet, Rfl: 2  EXAM:   General impression: alert, cooperative and articulate.  No signs of being in distress  Lungs: speech is fluent sentence length suggests that patient is not short of breath and not punctuated by cough, sneezing or sniffing. Marland Kitchen   Psych: affect normal.  speech is articulate and non pressured .  Denies suicidal thoughts   ASSESSMENT AND PLAN: Gouty arthritis Assessment &  Plan: He is having a flare,  the first since May .  Request for prednisone deferred; advised to use colchicine.  Continue allopurinol.  5 day course of hydrocodone prescribed because his insurance has denied refill on his tramadol without PA   Other orders -     HYDROcodone-Acetaminophen; Take 1 tablet by mouth every 6 (six) hours as needed for up to 5 days for moderate pain (pain score 4-6).  Dispense: 20 tablet; Refill: 0      I discussed the assessment and treatment plan with the patient. The patient was provided an opportunity to ask questions and all were answered.  The patient agreed with the plan and demonstrated an understanding of the instructions.   The patient was advised to call back or seek an in-person evaluation if the symptoms worsen or if the condition fails to improve as anticipated.   I spent  22 minutes dedicated to the care of this patient on the date of this telephone  encounter to include pre-visit review of his medical history,  tcounselling on proper management of gout, contacting his pharmacy ,  and post visit ordering of testing and therapeutics.    Sherlene Shams, MD

## 2022-12-25 NOTE — Telephone Encounter (Signed)
PA is needed for Tramadol

## 2022-12-25 NOTE — Telephone Encounter (Signed)
Refilled: 07/04/2022 Last OV:  11/20/2022 Next OV: 12/25/2022

## 2022-12-25 NOTE — Telephone Encounter (Signed)
noted 

## 2022-12-25 NOTE — Telephone Encounter (Signed)
Prescription Request  12/25/2022  LOV: Visit date not found  What is the name of the medication or equipment? traMADol (ULTRAM) 50 MG tablet  Have you contacted your pharmacy to request a refill? Yes   Which pharmacy would you like this sent to?  [id number] [place], [place] - [address] AT [facility] [address] [place] [address] Phone: [phone] Fax: [phone]    Patient notified that their request is being sent to the clinical staff for review and that they should receive a response within 2 business days.   Please advise at Mobile [phone] (mobile)  Patient states he has three pills left and he has a virtual appointment scheduled with Dr. Duncan Dull today.

## 2022-12-26 ENCOUNTER — Other Ambulatory Visit (HOSPITAL_COMMUNITY): Payer: Self-pay

## 2022-12-26 ENCOUNTER — Telehealth: Payer: Self-pay

## 2022-12-26 NOTE — Telephone Encounter (Signed)
Pharmacy Patient Advocate Encounter  Received notification from [facility] that Prior Authorization for traMADol HCl 50MG  tabletshas been APPROVED from 12/26/2022 to 06/24/2023. Ran test claim, Copay is $4.00. This test claim was processed through [facility]- copay amounts may vary at other pharmacies due to pharmacy/plan contracts, or as the patient moves through the different stages of their insurance plan.   PA #/Case ID/Reference #: [id number]

## 2022-12-26 NOTE — Telephone Encounter (Signed)
Pharmacy Patient Advocate Encounter   Received notification from Pt Calls Messages that prior authorization for traMADol HCl 50MG  tablets is required/requested.   Insurance verification completed.   [name] insured through [facility] .   Per test claim: PA required; PA submitted to above mentioned insurance via CoverMyMeds Key/confirmation #/EOC [id number] Status is pending

## 2022-12-26 NOTE — Telephone Encounter (Signed)
Need to let pt know that the Tramadol has been approved through his insurance.

## 2022-12-26 NOTE — Telephone Encounter (Signed)
PA request has been Submitted. New Encounter created for follow up. For additional info see Pharmacy Prior Auth telephone encounter from 12/26/22.

## 2022-12-26 NOTE — Assessment & Plan Note (Signed)
He is having a flare,  the first since May .  Request for prednisone deferred; advised to use colchicine.  Continue allopurinol.  5 day course of hydrocodone prescribed because his insurance has denied refill on his tramadol without PA

## 2023-01-05 ENCOUNTER — Ambulatory Visit: Payer: Medicaid Other | Admitting: Internal Medicine

## 2023-01-09 DIAGNOSIS — R002 Palpitations: Secondary | ICD-10-CM

## 2023-01-09 NOTE — Telephone Encounter (Signed)
Error

## 2023-01-15 ENCOUNTER — Ambulatory Visit: Payer: Medicaid Other | Admitting: Internal Medicine

## 2023-01-23 ENCOUNTER — Telehealth: Payer: Self-pay | Admitting: Internal Medicine

## 2023-01-23 NOTE — Telephone Encounter (Signed)
Judeth Cornfield called stating the patient BP 80/60. She was transferred to Access Nurse.

## 2023-01-23 NOTE — Telephone Encounter (Signed)
LMTCB with pt. Per access nurse note pt declined calling 911.

## 2023-01-23 NOTE — Telephone Encounter (Signed)
Attempted to call pt. No answer. Mailbox full.  

## 2023-01-24 ENCOUNTER — Telehealth: Payer: Self-pay

## 2023-01-24 NOTE — Telephone Encounter (Signed)
Patient states he checked his blood pressure twice last night around 10pm (left arm - 126/84, pulse 85 and right arm - 128/81, pulse 89 and once this morning and it was 135/88.  Patient apologizes that we were unable to reach him.  Patient states he has cleaned off his voicemail.

## 2023-01-26 NOTE — Telephone Encounter (Signed)
Error

## 2023-01-30 ENCOUNTER — Ambulatory Visit: Payer: Medicaid Other | Admitting: Cardiovascular Disease

## 2023-02-07 ENCOUNTER — Ambulatory Visit: Payer: Medicaid Other | Admitting: Internal Medicine

## 2023-02-19 ENCOUNTER — Ambulatory Visit: Payer: Medicaid Other

## 2023-02-19 ENCOUNTER — Ambulatory Visit: Payer: Medicaid Other | Admitting: Internal Medicine

## 2023-02-19 ENCOUNTER — Encounter: Payer: Self-pay | Admitting: Internal Medicine

## 2023-02-19 VITALS — BP 84/68 | HR 105 | Ht 70.0 in | Wt 209.0 lb

## 2023-02-19 DIAGNOSIS — M25551 Pain in right hip: Secondary | ICD-10-CM | POA: Diagnosis not present

## 2023-02-19 DIAGNOSIS — M25552 Pain in left hip: Secondary | ICD-10-CM

## 2023-02-19 DIAGNOSIS — I10 Essential (primary) hypertension: Secondary | ICD-10-CM | POA: Diagnosis not present

## 2023-02-19 DIAGNOSIS — G8929 Other chronic pain: Secondary | ICD-10-CM

## 2023-02-19 DIAGNOSIS — K760 Fatty (change of) liver, not elsewhere classified: Secondary | ICD-10-CM

## 2023-02-19 DIAGNOSIS — I2584 Coronary atherosclerosis due to calcified coronary lesion: Secondary | ICD-10-CM

## 2023-02-19 DIAGNOSIS — R931 Abnormal findings on diagnostic imaging of heart and coronary circulation: Secondary | ICD-10-CM

## 2023-02-19 DIAGNOSIS — R748 Abnormal levels of other serum enzymes: Secondary | ICD-10-CM | POA: Diagnosis not present

## 2023-02-19 DIAGNOSIS — R519 Headache, unspecified: Secondary | ICD-10-CM | POA: Diagnosis not present

## 2023-02-19 DIAGNOSIS — R400 Somnolence: Secondary | ICD-10-CM

## 2023-02-19 DIAGNOSIS — I2510 Atherosclerotic heart disease of native coronary artery without angina pectoris: Secondary | ICD-10-CM

## 2023-02-19 DIAGNOSIS — F321 Major depressive disorder, single episode, moderate: Secondary | ICD-10-CM

## 2023-02-19 DIAGNOSIS — R0683 Snoring: Secondary | ICD-10-CM

## 2023-02-19 MED ORDER — PANTOPRAZOLE SODIUM 40 MG PO TBEC: 40.0000 mg | DELAYED_RELEASE_TABLET | Freq: Two times a day (BID) | ORAL | 1 refills | Status: AC

## 2023-02-19 MED ORDER — TRAMADOL HCL 50 MG PO TABS: 50.0000 mg | ORAL_TABLET | Freq: Four times a day (QID) | ORAL | 2 refills | Status: DC | PRN

## 2023-02-19 MED ORDER — CARVEDILOL 3.125 MG PO TABS: 3.1250 mg | ORAL_TABLET | Freq: Two times a day (BID) | ORAL | 3 refills | Status: DC

## 2023-02-19 MED ORDER — SERTRALINE HCL 50 MG PO TABS: 50.0000 mg | ORAL_TABLET | Freq: Every day | ORAL | 1 refills | Status: AC

## 2023-02-19 MED ORDER — ALLOPURINOL 200 MG PO TABS: 200.0000 mg | ORAL_TABLET | Freq: Every day | ORAL | 1 refills | Status: DC

## 2023-02-19 NOTE — Patient Instructions (Addendum)
STOP THE hydrochlorothiazide  and the carvedilol for 48  hours .  Continue the losartan   On Thursday restart carvedilol at a lower dose of 3.125 mg twice daily    Hip x rays:  go to [facility] medical mall at your convenience   Continue tramadol.  Ok to use 1000 mg tylenol daily AS LONG AS YOU ARE EATING AND NOT DRINKING MORE THAN ONE GLASS OF WINE PER NIGHT    Increase sertraline to 1.5 tablets daily for one week,  then increase to 2 tablets daily,  for anxiety
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
## 2023-02-20 DIAGNOSIS — G8929 Other chronic pain: Secondary | ICD-10-CM | POA: Insufficient documentation

## 2023-02-20 LAB — COMPREHENSIVE METABOLIC PANEL
ALT: 44 U/L (ref 0–53)
AST: 43 U/L — ABNORMAL HIGH (ref 0–37)
Albumin: 4.5 g/dL (ref 3.5–5.2)
Alkaline Phosphatase: 94 U/L (ref 39–117)
BUN: 21 mg/dL (ref 6–23)
CO2: 26 meq/L (ref 19–32)
Calcium: 9.5 mg/dL (ref 8.4–10.5)
Chloride: 95 meq/L — ABNORMAL LOW (ref 96–112)
Creatinine, Ser: 1.84 mg/dL — ABNORMAL HIGH (ref 0.40–1.50)
GFR: 39.25 mL/min — ABNORMAL LOW (ref >60.00)
Glucose, Bld: 113 mg/dL — ABNORMAL HIGH (ref 70–99)
Potassium: 3.6 meq/L (ref 3.5–5.1)
Sodium: 132 meq/L — ABNORMAL LOW (ref 135–145)
Total Bilirubin: 1 mg/dL (ref 0.2–1.2)
Total Protein: 7.8 g/dL (ref 6.0–8.3)
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
## 2023-02-20 NOTE — Assessment & Plan Note (Signed)
Aggravated by financial and health stressors.  Increase sertraline dose to 75 mg daily

## 2023-02-20 NOTE — Assessment & Plan Note (Signed)
Stopping hydrochlorothiazide and carvedilol for 48 hours.  Resume carvedilol at lower dose once home BP is > 140/90

## 2023-02-20 NOTE — Assessment & Plan Note (Signed)
 Avoiding NSAIDs,  Limiting alcohol use but not abstinent.  LFts are improving   Lab Results  Component Value Date   ALT 44 02/19/2023   AST 43 (H) 02/19/2023   ALKPHOS 94 02/19/2023   BILITOT 1.0 02/19/2023

## 2023-02-20 NOTE — Assessment & Plan Note (Signed)
 Despite his high coronary calcium  score,  He had a low risk  myoview .  He is taking aspirin,  High dose crestor  , beta blocker and ARB . Goal LDL is 70 and he is above goal  on 40 mg rosuvastatin .  Will discuss PCSK9 inhibitor at follow up   Lab Results  Component Value Date   CHOL 236 (H) 11/20/2022   HDL 67 11/20/2022   LDLCALC 132 (H) 11/20/2022   LDLDIRECT 150.0 03/24/2022   TRIG 227 (H) 11/20/2022   CHOLHDL 3.5 11/20/2022

## 2023-02-20 NOTE — Assessment & Plan Note (Addendum)
 Recommending referral to cardiology for risk stratification

## 2023-02-21 ENCOUNTER — Other Ambulatory Visit: Payer: Self-pay | Admitting: Internal Medicine

## 2023-02-21 DIAGNOSIS — R944 Abnormal results of kidney function studies: Secondary | ICD-10-CM | POA: Insufficient documentation

## 2023-02-22 ENCOUNTER — Telehealth: Payer: Self-pay

## 2023-02-22 NOTE — Telephone Encounter (Signed)
 VM left for pt to cb in regards to labs and to have pt scheduled for a repeat lab results.

## 2023-02-23 ENCOUNTER — Other Ambulatory Visit (HOSPITAL_COMMUNITY): Payer: Self-pay

## 2023-02-23 ENCOUNTER — Telehealth: Payer: Self-pay

## 2023-02-23 MED ORDER — ALLOPURINOL 100 MG PO TABS: 200.0000 mg | ORAL_TABLET | Freq: Every day | ORAL | 6 refills | Status: AC

## 2023-02-23 NOTE — Telephone Encounter (Signed)
 Copied from CRM [id number]. Topic: General - Other >> Feb 23, 2023  4:54 PM Evie B wrote: Reason for CRM: pt returned call, states he does not have a ride to come back in and retake labs. He states he as an appt on 03/21/23 and will take them then due to no transportation. Please call pt back if he needs to retake labs sooner than 03/21/23 #[phone]

## 2023-02-23 NOTE — Telephone Encounter (Signed)
 Pt is unable to have repeat labs done prior to his appt on 03/21/2023 due to transportation.

## 2023-02-23 NOTE — Telephone Encounter (Signed)
 Spoke with pt and informed him about the medication. Pt gave a verbal understanding.

## 2023-02-23 NOTE — Telephone Encounter (Signed)
 Pharmacy Patient Advocate Encounter   Received notification from CoverMyMeds that prior authorization for Allopurinol  200 mg is required/requested.   Insurance verification completed.   [name] insured through [facility] .   Per test claim:  Allopurinol  100 mg 2 tablets daily ($4.00 copay) is preferred by the insurance.  If suggested medication is appropriate, Please send in a new RX and discontinue this one. If not, please advise as to why it's not appropriate so that we may request a Prior Authorization. Please note, some preferred medications may still require a PA

## 2023-02-28 ENCOUNTER — Encounter: Payer: Self-pay | Admitting: *Deleted

## 2023-03-01 ENCOUNTER — Telehealth: Payer: Self-pay

## 2023-03-01 NOTE — Telephone Encounter (Signed)
 Copied from CRM [id number]. Topic: Clinical - Lab/Test Results >> Mar 01, 2023  4:16 PM Alvino Blood C wrote: Reason for CRM: Patient is wanting a call back to discuss his x-ray results, he is feeling a little uneasy and would like some clarity.

## 2023-03-02 NOTE — Telephone Encounter (Signed)
 LMTCB. Need to let pt know that the xray's have not been read by the radiologist yet and we will call him as soon as we get the results.

## 2023-03-05 ENCOUNTER — Other Ambulatory Visit: Payer: Self-pay | Admitting: Urology

## 2023-03-05 DIAGNOSIS — R972 Elevated prostate specific antigen [PSA]: Secondary | ICD-10-CM

## 2023-03-06 ENCOUNTER — Telehealth: Payer: Self-pay

## 2023-03-06 NOTE — Telephone Encounter (Signed)
 See result note message

## 2023-03-06 NOTE — Telephone Encounter (Signed)
 Copied from CRM [id number]. Topic: Clinical - Lab/Test Results >> Mar 06, 2023  4:31 PM Adaysia C wrote: Reason for CRM: Patient called back because he missed a call from the providers wanting to discuss his X-Ray results, he wanted to apologize for missing the call. Please follow up with patient about X-Ray results [phone]

## 2023-03-16 ENCOUNTER — Encounter: Payer: Self-pay | Admitting: Urology

## 2023-03-21 ENCOUNTER — Ambulatory Visit: Payer: Medicaid Other | Admitting: Internal Medicine

## 2023-03-22 ENCOUNTER — Ambulatory Visit
Admission: RE | Admit: 2023-03-22 | Discharge: 2023-03-22 | Disposition: A | Discharge status: Home / Self Care | Payer: Medicaid Other | Source: Ambulatory Visit | Attending: Urology | Admitting: Urology

## 2023-03-22 DIAGNOSIS — R972 Elevated prostate specific antigen [PSA]: Secondary | ICD-10-CM

## 2023-03-22 MED ORDER — GADOPICLENOL 0.5 MMOL/ML IV SOLN
10.0000 mL | Freq: Once | INTRAVENOUS | Status: AC | PRN
  Administered 2023-03-22: 10 mL via INTRAVENOUS

## 2023-03-29 ENCOUNTER — Ambulatory Visit: Payer: Medicaid Other | Admitting: Cardiovascular Disease

## 2023-03-30 DIAGNOSIS — K7460 Unspecified cirrhosis of liver: Secondary | ICD-10-CM | POA: Insufficient documentation

## 2023-04-02 ENCOUNTER — Ambulatory Visit: Payer: Medicaid Other | Admitting: Cardiology

## 2023-04-11 ENCOUNTER — Ambulatory Visit: Payer: Medicaid Other | Admitting: Internal Medicine

## 2023-04-17 ENCOUNTER — Other Ambulatory Visit: Payer: Self-pay | Admitting: Internal Medicine

## 2023-06-20 ENCOUNTER — Other Ambulatory Visit (HOSPITAL_COMMUNITY): Payer: Self-pay | Admitting: Urology

## 2023-06-20 DIAGNOSIS — C61 Malignant neoplasm of prostate: Secondary | ICD-10-CM

## 2023-06-23 ENCOUNTER — Other Ambulatory Visit: Payer: Self-pay | Admitting: Internal Medicine

## 2023-06-27 ENCOUNTER — Encounter: Payer: Self-pay | Admitting: Cardiology

## 2023-06-28 ENCOUNTER — Ambulatory Visit: Attending: Cardiology | Admitting: Cardiology

## 2023-06-28 ENCOUNTER — Encounter: Payer: Self-pay | Admitting: Cardiology

## 2023-06-28 VITALS — BP 150/102 | HR 74 | Ht 71.0 in | Wt 226.0 lb

## 2023-06-28 DIAGNOSIS — Z01810 Encounter for preprocedural cardiovascular examination: Secondary | ICD-10-CM

## 2023-06-28 DIAGNOSIS — I2584 Coronary atherosclerosis due to calcified coronary lesion: Secondary | ICD-10-CM

## 2023-06-28 DIAGNOSIS — R002 Palpitations: Secondary | ICD-10-CM

## 2023-06-28 DIAGNOSIS — R0609 Other forms of dyspnea: Secondary | ICD-10-CM | POA: Diagnosis not present

## 2023-06-28 DIAGNOSIS — I2510 Atherosclerotic heart disease of native coronary artery without angina pectoris: Secondary | ICD-10-CM

## 2023-06-28 DIAGNOSIS — R931 Abnormal findings on diagnostic imaging of heart and coronary circulation: Secondary | ICD-10-CM

## 2023-06-28 DIAGNOSIS — I10 Essential (primary) hypertension: Secondary | ICD-10-CM

## 2023-06-28 DIAGNOSIS — E785 Hyperlipidemia, unspecified: Secondary | ICD-10-CM

## 2023-06-28 DIAGNOSIS — R7303 Prediabetes: Secondary | ICD-10-CM

## 2023-06-28 NOTE — Progress Notes (Signed)
 Cardiology Consultation:    Date:  06/28/2023   ID:  Jeff [name], DOB 27-Nov-1960, MRN [id number]  PCP:  Thersia Flax, MD  Cardiologist:  Ralene Burger, MD   Referring MD: Tona Francis, NP   Chief Complaint  Patient presents with   Hypertension    R sided is higher   Shortness of Breath   Chest Pain   Medical Clearance    Dr. Rozanne Corners Prostate removal     History of Present Illness:    Jeff [name] is a 63 y.o. male who is being seen today for the evaluation of cardiovascular preop evaluation at the request of Tona Francis, NP.  Past history significant for elevated calcium  score in 2019, was more than 400, at that time he did have a stress test which was negative, additional problem include essential hypertension, prolonged diabetes, dyslipidemia.  Many of this problems are not being well addressed.  He required prostate surgery for prostate cancer prostatectomy and he was referred to us  for evaluation.  He also got glaucoma and poor vision.  He is to exercise on the regular basis but admits he does not do it anymore.  Described to have some chest pain sometimes sometimes happen when he walks symptoms happen when he sits.  Does have some shortness of breath did have some swelling of lower extremities when amlodipine  however that medication has been discontinued.  Does have family history of coronary artery disease, does not smoke, does not exercise on the regular basis used to but because of that, he does not do it  Past Medical History:  Diagnosis Date   Arthritis    KNEES   Cataract    REMOVED BILATERAL   Claustrophobia    Displaced fracture of base of third metacarpal bone, right hand, initial encounter for closed fracture 08/29/2016   GERD (gastroesophageal reflux disease)    with certain foods   Glaucoma (increased eye pressure)    Headache(784.0)    07/11/13- none in a long time   Hepatic steatosis    Hyperglycemia    Hyperlipidemia     Hypertension    no meds   Sleep apnea     Past Surgical History:  Procedure Laterality Date   CATARACT EXTRACTION W/PHACO Left 07/16/2013   Procedure: CATARACT EXTRACTION PHACO AND INTRAOCULAR LENS PLACEMENT (IOC) LEFT EYE;  Surgeon: Ben Bracken, MD;  Location: [facility] OR;  Service: Ophthalmology;  Laterality: Left;   CATARACT EXTRACTION W/PHACO Right 10/29/2013   Procedure: CATARACT EXTRACTION PHACO AND INTRAOCULAR LENS PLACEMENT (IOC);  Surgeon: Ben Bracken, MD;  Location: [facility] OR;  Service: Ophthalmology;  Laterality: Right;   EYE SURGERY     2 on left eye   FINGER SURGERY Right    MINI SHUNT INSERTION  03/09/2011   Procedure: INSERTION OF MINI SHUNT;  Surgeon: Ben Bracken, MD;  Location: [facility] OR;  Service: Ophthalmology;  Laterality: Left;    Current Medications: Current Meds  Medication Sig   allopurinol  (ZYLOPRIM ) 100 MG tablet Take 2 tablets (200 mg total) by mouth daily.   Blood Pressure Monitoring (BLOOD PRESSURE MONITOR & DTX) KIT Check blood pressure once a day (Patient taking differently: 1 each by Other route daily. Check blood pressure once a day)   carvedilol  (COREG ) 3.125 MG tablet TAKE 1 TABLET BY MOUTH TWICE A DAY WITH A MEAL (Patient taking differently: Take 3.125 mg by mouth 2 (two) times daily with a meal.)   colchicine  0.6 MG tablet TAKE 1 TABLET BY  MOUTH HOURLY UNTIL GOUT IMPROVED/DIARRHEA,ON 1ST DAY OF TREATMENT,THEN 1 TAB DAILY THEREAFTER IF NEEDED (Patient taking differently: Take 0.6 mg by mouth See admin instructions. TAKE 1 TABLET BY MOUTH HOURLY UNTIL GOUT IMPROVED/DIARRHEA,ON 1ST DAY OF TREATMENT,THEN 1 TAB DAILY THEREAFTER IF NEEDED)   COSOPT PF 2-0.5 % SOLN Place 1 drop into the right eye 2 (two) times daily.   losartan  (COZAAR ) 100 MG tablet TAKE 1 TABLET BY MOUTH EVERY DAY   pantoprazole  (PROTONIX ) 40 MG tablet Take 1 tablet (40 mg total) by mouth 2 (two) times daily.   rosuvastatin  (CRESTOR ) 20 MG tablet TAKE 1 TABLET BY MOUTH EVERY DAY   sertraline  (ZOLOFT )  50 MG tablet Take 1 tablet (50 mg total) by mouth daily. (Patient taking differently: Take 50 mg by mouth as needed (amxiety).)   sildenafil  (REVATIO ) 20 MG tablet Take 20 mg by mouth 3 (three) times daily.   traMADol  (ULTRAM ) 50 MG tablet Take 1 tablet (50 mg total) by mouth every 6 (six) hours as needed. (Patient taking differently: Take 50 mg by mouth every 6 (six) hours as needed for moderate pain (pain score 4-6) or severe pain (pain score 7-10).)   [DISCONTINUED] amLODipine  (NORVASC ) 5 MG tablet Take 1 tablet (5 mg total) by mouth daily.   [DISCONTINUED] prednisoLONE acetate (PRED FORTE) 1 % ophthalmic suspension Place 1 drop into both eyes 4 (four) times daily.     Allergies:   Keflex [cephalexin]   Social History   Socioeconomic History   Marital status: Divorced    Spouse name: Not on file   Number of children: 0   Years of education: college3   Highest education level: Not on file  Occupational History   Occupation: Aeronautical engineer: [employer]  Tobacco Use   Smoking status: Never   Smokeless tobacco: Never  Vaping Use   Vaping status: Never Used  Substance and Sexual Activity   Alcohol use: Yes    Alcohol/week: 2.0 standard drinks of alcohol    Types: 2 Cans of beer per week    Comment: daily   Drug use: No   Sexual activity: Not on file  Other Topics Concern   Not on file  Social History Narrative   Not on file   Social Drivers of Health   Financial Resource Strain: Not on file  Food Insecurity: Not on file  Transportation Needs: Not on file  Physical Activity: Unknown (01/08/2017)   Received from [facility], [facility]   Exercise Vital Sign    Days of Exercise per Week: 2 days    Minutes of Exercise per Session: Not on file  Stress: Not on file  Social Connections: Not on file     Family History: The patient's family history includes Aneurysm in his brother and father; Asthma in his father, mother, and paternal  aunt; Early death in his brother and sister; Heart disease in his father, mother, and sister; Hyperlipidemia in his father and mother; Hypertension in his father and mother; Liver cancer in his mother; Stroke in his father; Thyroid  disease in his mother. There is no history of Migraines. ROS:   Please see the history of present illness.    All 14 point review of systems negative except as described per history of present illness.  EKGs/Labs/Other Studies Reviewed:    The following studies were reviewed today:   EKG:  EKG Interpretation Date/Time:  Thursday Jun 28 2023 08:53:38 EDT Ventricular Rate:  82 PR  Interval:  154 QRS Duration:  80 QT Interval:  370 QTC Calculation: 432 R Axis:   -14  Text Interpretation: Normal sinus rhythm Low voltage QRS Septal infarct , age undetermined Abnormal ECG When compared with ECG of 01-Mar-2018 15:11, PREVIOUS ECG IS PRESENT Confirmed by Ralene Burger [phone]) on 06/28/2023 8:55:46 AM    Recent Labs: 02/19/2023: ALT 44; BUN 21; Creatinine, Ser 1.84; Potassium 3.6; Sodium 132  Recent Lipid Panel    Component Value Date/Time   CHOL 236 (H) 11/20/2022 1351   CHOL 179 07/23/2019 1447   TRIG 227 (H) 11/20/2022 1351   HDL 67 11/20/2022 1351   HDL 68 07/23/2019 1447   CHOLHDL 3.5 11/20/2022 1351   VLDL 26.6 07/04/2022 1451   LDLCALC 132 (H) 11/20/2022 1351   LDLDIRECT 150.0 03/24/2022 1019    Physical Exam:    VS:  BP (!) 150/102 (BP Location: Right Arm, Patient Position: Sitting)   Pulse 74   Ht 5\' 11"  (1.803 m)   Wt 226 lb (102.5 kg)   SpO2 99%   BMI 31.52 kg/m     Wt Readings from Last 3 Encounters:  06/28/23 226 lb (102.5 kg)  02/19/23 209 lb (94.8 kg)  12/25/22 224 lb (101.6 kg)     GEN:  Well nourished, well developed in no acute distress HEENT: Normal NECK: No JVD; No carotid bruits LYMPHATICS: No lymphadenopathy CARDIAC: RRR, no murmurs, no rubs, no gallops RESPIRATORY:  Clear to auscultation without rales, wheezing or  rhonchi  ABDOMEN: Soft, non-tender, non-distended MUSCULOSKELETAL:  No edema; No deformity  SKIN: Warm and dry NEUROLOGIC:  Alert and oriented x 3 PSYCHIATRIC:  Normal affect   ASSESSMENT:    1. Palpitations   2. Dyspnea on exertion   3. Coronary artery disease due to calcified coronary lesion   4. Primary hypertension   5. Abnormal screening cardiac CT   6. Dyslipidemia   7. Prediabetes   8. Preop cardiovascular exam    PLAN:    In order of problems listed above:  Cardiovascular preop evaluation for this gentleman with multiple risk factors for coronary artery disease, I do not think he is able to achieve 4 METS, he does have significantly elevated calcium  score from 2019, I will schedule him to have a stress test to make sure he does not have any inducible ischemia.  Because of dyspnea exertion echocardiogram will be done to assess left ventricle ejection fraction. In terms of long-term plans for him clearly we need to control his blood pressure better I do Chem-7 if Chem-7 is fine we will adjust his medications, he did try Norvasc  amlodipine  before gave him swelling of lower extremity so that is not a good choice.  In terms of prediabetes we have a long discussion about this and I strongly recommended to BL be more active lose some weight and avoid carbohydrates for now. Dyslipidemia apparently recently he was put back on Crestor .  Will check his fasting lipid profile today. I suspect he also get significant sleep apnea.  Will consider doing sleep study within the next couple weeks after surgery   Medication Adjustments/Labs and Tests Ordered: Current medicines are reviewed at length with the patient today.  Concerns regarding medicines are outlined above.  Orders Placed This Encounter  Procedures   Lipid panel   Comprehensive metabolic panel with GFR   MYOCARDIAL PERFUSION IMAGING   EKG 12-Lead   ECHOCARDIOGRAM COMPLETE   VAS US  VASCUSCREEN   No orders of the defined types  were placed in this encounter.   Signed, Manfred Seed, MD, [facility]. 06/28/2023 9:51 AM    [facility]

## 2023-06-28 NOTE — Patient Instructions (Signed)
 Medication Instructions:  Your physician recommends that you continue on your current medications as directed. Please refer to the Current Medication list given to you today.  *If you need a refill on your cardiac medications before your next appointment, please call your pharmacy*   Lab Work: None Ordered If you have labs (blood work) drawn today and your tests are completely normal, you will receive your results only by: MyChart Message (if you have MyChart) OR A paper copy in the mail If you have any lab test that is abnormal or we need to change your treatment, we will call you to review the results.   Testing/Procedures: Your physician has requested that you have a lexiscan myoview . For further information please visit [email or web address] Please follow instruction sheet, as given.  The test will take approximately 3 to 4 hours to complete; you may bring reading material.  If someone comes with you to your appointment, they will need to remain in the main lobby due to limited space in the testing area.   How to prepare for your Myocardial Perfusion Test: Do not eat or drink 3 hours prior to your test, except you may have water . Do not consume products containing caffeine (regular or decaffeinated) 12 hours prior to your test. (ex: coffee, chocolate, sodas, tea). Do bring a list of your current medications with you.  If not listed below, you may take your medications as normal. Do wear comfortable clothes (no dresses or overalls) and walking shoes, tennis shoes preferred (No heels or open toe shoes are allowed). Do NOT wear cologne, perfume, aftershave, or lotions (deodorant is allowed). If these instructions are not followed, your test will have to be rescheduled.    Vascular Screen- No preparation  Your physician has requested that you have an echocardiogram. Echocardiography is a painless test that uses sound waves to create images of your heart. It provides your doctor with  information about the size and shape of your heart and how well your heart's chambers and valves are working. This procedure takes approximately one hour. There are no restrictions for this procedure. Please do NOT wear cologne, perfume, aftershave, or lotions (deodorant is allowed). Please arrive 15 minutes prior to your appointment time.  Please note: We ask at that you not bring children with you during ultrasound (echo/ vascular) testing. Due to room size and safety concerns, children are not allowed in the ultrasound rooms during exams. Our front office staff cannot provide observation of children in our lobby area while testing is being conducted. An adult accompanying a patient to their appointment will only be allowed in the ultrasound room at the discretion of the ultrasound technician under special circumstances. We apologize for any inconvenience.    Follow-Up: At [facility], you and your health needs are our priority.  As part of our continuing mission to provide you with exceptional heart care, we have created designated Provider Care Teams.  These Care Teams include your primary Cardiologist (physician) and Advanced Practice Providers (APPs -  Physician Assistants and Nurse Practitioners) who all work together to provide you with the care you need, when you need it.  We recommend signing up for the patient portal called "MyChart".  Sign up information is provided on this After Visit Summary.  MyChart is used to connect with patients for Virtual Visits (Telemedicine).  Patients are able to view lab/test results, encounter notes, upcoming appointments, etc.  Non-urgent messages can be sent to your provider as well.   To learn  more about what you can do with MyChart, go to [email or web address].    Your next appointment:   3 month(s)  The format for your next appointment:   In Person  Provider:   Ralene Burger, MD    Other Instructions NA

## 2023-06-29 LAB — COMPREHENSIVE METABOLIC PANEL WITH GFR
ALT: 137 IU/L — ABNORMAL HIGH (ref 0–44)
AST: 130 IU/L — ABNORMAL HIGH (ref 0–40)
Albumin: 4.1 g/dL (ref 3.9–4.9)
Alkaline Phosphatase: 128 IU/L — ABNORMAL HIGH (ref 44–121)
BUN/Creatinine Ratio: 16 (ref 10–24)
BUN: 15 mg/dL (ref 8–27)
Bilirubin Total: 0.5 mg/dL (ref 0.0–1.2)
CO2: 24 mmol/L (ref 20–29)
Calcium: 9.3 mg/dL (ref 8.6–10.2)
Chloride: 100 mmol/L (ref 96–106)
Creatinine, Ser: 0.92 mg/dL (ref 0.76–1.27)
Globulin, Total: 2.5 g/dL (ref 1.5–4.5)
Glucose: 114 mg/dL — ABNORMAL HIGH (ref 70–99)
Potassium: 4.8 mmol/L (ref 3.5–5.2)
Sodium: 138 mmol/L (ref 134–144)
Total Protein: 6.6 g/dL (ref 6.0–8.5)
eGFR: 95 mL/min/{1.73_m2} (ref >59)

## 2023-06-29 LAB — LIPID PANEL
Chol/HDL Ratio: 3.1 ratio (ref 0.0–5.0)
Cholesterol, Total: 183 mg/dL (ref 100–199)
HDL: 59 mg/dL (ref >39)
LDL Chol Calc (NIH): 85 mg/dL (ref 0–99)
Triglycerides: 235 mg/dL — ABNORMAL HIGH (ref 0–149)
VLDL Cholesterol Cal: 39 mg/dL (ref 5–40)

## 2023-07-03 ENCOUNTER — Ambulatory Visit: Payer: Self-pay | Admitting: Cardiology

## 2023-07-03 DIAGNOSIS — E785 Hyperlipidemia, unspecified: Secondary | ICD-10-CM

## 2023-07-03 NOTE — Telephone Encounter (Signed)
 Patient notified through my chart and LM informing pt of my chart message.  Awaiting response. Lab order on file.

## 2023-07-03 NOTE — Telephone Encounter (Signed)
-----   Message from Ralene Burger sent at 07/03/2023 12:55 PM EDT ----- Cholesterol is good however his liver function tests are up again.  He needs to stop Crestor  for now fasting lipid profile, AST LT need to be rechecked in 1 month everything else looks fine

## 2023-07-04 ENCOUNTER — Telehealth: Payer: Self-pay | Admitting: Cardiology

## 2023-07-04 ENCOUNTER — Other Ambulatory Visit: Payer: Self-pay | Admitting: Internal Medicine

## 2023-07-04 NOTE — Telephone Encounter (Signed)
 Pt is requesting a callback regarding results. He stated he couldn't read the message sent on mychart due to his vision issues so he'd like to speak with nurse over the phone about it. Please advise

## 2023-07-04 NOTE — Telephone Encounter (Signed)
 Attempted to call pt. No answer no voicemail. Need to let pt know that this medication was refilled on 06/26/2023.

## 2023-07-04 NOTE — Telephone Encounter (Signed)
 Spoke with patient notified of results and recommendations and agreed with plan. Lab order on file. Med list updated

## 2023-07-04 NOTE — Telephone Encounter (Signed)
 Copied from CRM [id number]. Topic: Clinical - Medication Refill >> Jul 04, 2023  3:03 PM Marlan Silva wrote: Medication:  carvedilol  (COREG ) 3.125 MG tablet  Has the patient contacted their pharmacy? Yes (Agent: If no, request that the patient contact the pharmacy for the refill. If patient does not wish to contact the pharmacy document the reason why and proceed with request.) (Agent: If yes, when and what did the pharmacy advise?)  This is the patient's preferred pharmacy:  [id number] - [place], [place] - [address] AT [facility] [address] [place] [address] Phone: [phone] Fax: [phone]  Is this the correct pharmacy for this prescription? Yes If no, delete pharmacy and type the correct one.   Has the prescription been filled recently? Yes  Is the patient out of the medication? Yes  Has the patient been seen for an appointment in the last year OR does the patient have an upcoming appointment? Yes  Can we respond through MyChart? Yes  Agent: Please be advised that Rx refills may take up to 3 business days. We ask that you follow-up with your pharmacy.

## 2023-07-04 NOTE — Addendum Note (Signed)
 Addended by: Renie Stelmach M on: 07/04/2023 02:08 PM   Modules accepted: Orders

## 2023-07-18 ENCOUNTER — Telehealth: Payer: Self-pay | Admitting: *Deleted

## 2023-07-18 NOTE — Telephone Encounter (Signed)
 Left a detailed message with instructions on voice mail  regarding his STRESS TEST on 07/25/23 at 7:45. Also a reminder not to rake his Coreg  for 24 hours before the day of his test.

## 2023-07-20 ENCOUNTER — Ambulatory Visit: Admitting: Cardiology

## 2023-07-21 ENCOUNTER — Other Ambulatory Visit: Payer: Self-pay | Admitting: Internal Medicine

## 2023-07-23 ENCOUNTER — Telehealth: Payer: Self-pay | Admitting: Cardiology

## 2023-07-23 NOTE — Telephone Encounter (Signed)
 Pt is requesting a callback regarding him wanting to know what he can or can't do before his appts on 6/4. Please advise

## 2023-07-23 NOTE — Telephone Encounter (Signed)
 Left VM to return call

## 2023-07-24 ENCOUNTER — Telehealth: Payer: Self-pay

## 2023-07-24 NOTE — Telephone Encounter (Signed)
 Refilled: 02/19/2023 Last OV: 02/19/2023 Next OV: not scheduled

## 2023-07-24 NOTE — Telephone Encounter (Signed)
 Patient is holding to return RN's call.

## 2023-07-24 NOTE — Telephone Encounter (Signed)
 Instructions reviewed for Cardiolite and echo. Pt verbalized understanding and had no additional questions.

## 2023-07-24 NOTE — Telephone Encounter (Signed)
 Left vm to return call.

## 2023-07-24 NOTE — Telephone Encounter (Signed)
 Office to advise - medication is not showing location sent. Please advise.   Copied from CRM [id number]. Topic: Clinical - Prescription Issue >> Jul 24, 2023  1:48 PM Martinique E wrote: Reason for CRM: Patient stated [facility] up his traMADol  (ULTRAM ) 50 MG tablet and they stated how this medication was denied because it got sent to a different pharmacy. [facility]. Patient wanting clarification on where it was sent and why. Callback number for patient is [phone].

## 2023-07-25 ENCOUNTER — Ambulatory Visit: Attending: Cardiology

## 2023-07-25 ENCOUNTER — Other Ambulatory Visit: Payer: Self-pay | Admitting: Internal Medicine

## 2023-07-25 ENCOUNTER — Ambulatory Visit (INDEPENDENT_AMBULATORY_CARE_PROVIDER_SITE_OTHER)

## 2023-07-25 ENCOUNTER — Telehealth: Payer: Self-pay

## 2023-07-25 DIAGNOSIS — R0609 Other forms of dyspnea: Secondary | ICD-10-CM | POA: Insufficient documentation

## 2023-07-25 LAB — ECHOCARDIOGRAM COMPLETE
AR max vel: 2.84 cm2
AV Area VTI: 2.85 cm2
AV Area mean vel: 2.8 cm2
AV Mean grad: 4.8 mmHg
AV Peak grad: 10.1 mmHg
Ao pk vel: 1.59 m/s
Area-P 1/2: 3.85 cm2
Height: 71 in
MV M vel: 5.61 m/s
MV Peak grad: 125.9 mmHg
P 1/2 time: 433 ms
S' Lateral: 3.1 cm
Weight: 3616 [oz_av]

## 2023-07-25 MED ORDER — TECHNETIUM TC 99M TETROFOSMIN IV KIT
29.1000 | PACK | Freq: Once | INTRAVENOUS | Status: AC | PRN
  Administered 2023-07-25: 29.1 via INTRAVENOUS

## 2023-07-25 MED ORDER — TRAMADOL HCL 50 MG PO TABS: 50.0000 mg | ORAL_TABLET | Freq: Three times a day (TID) | ORAL | 2 refills | Status: DC | PRN

## 2023-07-25 MED ORDER — PERFLUTREN LIPID MICROSPHERE
1.0000 mL | INTRAVENOUS | Status: AC | PRN
  Administered 2023-07-25: 8 mL via INTRAVENOUS

## 2023-07-25 MED ORDER — TRAMADOL HCL 50 MG PO TABS: 50.0000 mg | ORAL_TABLET | Freq: Four times a day (QID) | ORAL | 2 refills | Status: DC | PRN

## 2023-07-25 MED ORDER — REGADENOSON 0.4 MG/5ML IV SOLN
0.4000 mg | Freq: Once | INTRAVENOUS | Status: AC
  Administered 2023-07-25: 0.4 mg via INTRAVENOUS

## 2023-07-25 MED ORDER — TECHNETIUM TC 99M TETROFOSMIN IV KIT
10.9000 | PACK | Freq: Once | INTRAVENOUS | Status: AC | PRN
  Administered 2023-07-25: 10.9 via INTRAVENOUS

## 2023-07-25 NOTE — Telephone Encounter (Signed)
 Left a detailed message letting pt know that [facility] [place].

## 2023-07-25 NOTE — Telephone Encounter (Signed)
 Pharmacy Patient Advocate Encounter   Received notification from CoverMyMeds that prior authorization for Tramdol 50 mg tablets is required/requested.   Insurance verification completed.   [name] insured through [facility] .   Per test claim: PA required; PA submitted to above mentioned insurance via CoverMyMeds Key/confirmation #/EOC [id number] Status is pending

## 2023-07-25 NOTE — Telephone Encounter (Signed)
 Medication did not go electronically. [facility] [place].

## 2023-07-25 NOTE — Addendum Note (Signed)
 Addended by: Thersia Flax on: 07/25/2023 12:28 PM   Modules accepted: Orders

## 2023-07-26 ENCOUNTER — Other Ambulatory Visit (HOSPITAL_COMMUNITY): Payer: Self-pay

## 2023-07-26 LAB — MYOCARDIAL PERFUSION IMAGING
LV dias vol: 96 mL (ref 62–150)
LV sys vol: 34 mL
Nuc Stress EF: 64 %
Peak HR: 96 {beats}/min
Rest HR: 77 {beats}/min
Rest Nuclear Isotope Dose: 10.9 mCi
SDS: 2
SRS: 4
SSS: 6
ST Depression (mm): 0 mm
Stress Nuclear Isotope Dose: 29.1 mCi
TID: 1.05

## 2023-07-26 NOTE — Telephone Encounter (Signed)
 Pharmacy Patient Advocate Encounter  Received notification from [facility] that Prior Authorization for Tramadol  50 tablets has been APPROVED from 07/25/2023 to 01/21/2024. Unable to obtain price due to refill too soon rejection, last fill date 07/26/2023 next available fill date 08/21/2023   PA #/Case ID/Reference #: [id number]

## 2023-08-01 ENCOUNTER — Other Ambulatory Visit (HOSPITAL_COMMUNITY): Payer: Self-pay | Admitting: Urology

## 2023-08-01 DIAGNOSIS — C61 Malignant neoplasm of prostate: Secondary | ICD-10-CM

## 2023-08-21 ENCOUNTER — Telehealth: Payer: Self-pay | Admitting: Cardiology

## 2023-08-21 NOTE — Telephone Encounter (Signed)
 Pt called in asking for an update on his stress test and echo results.

## 2023-08-22 NOTE — Telephone Encounter (Signed)
 LVM with normal stress test and Echo results. Encouraged pt to call with any questions.

## 2023-08-28 ENCOUNTER — Ambulatory Visit: Admitting: Physical Therapy

## 2023-09-03 ENCOUNTER — Ambulatory Visit (HOSPITAL_COMMUNITY)
Admission: RE | Admit: 2023-09-03 | Discharge: 2023-09-03 | Disposition: A | Discharge status: Home / Self Care | Source: Ambulatory Visit | Attending: Urology | Admitting: Urology

## 2023-09-03 ENCOUNTER — Ambulatory Visit: Attending: Urology

## 2023-09-03 ENCOUNTER — Other Ambulatory Visit: Payer: Self-pay

## 2023-09-03 DIAGNOSIS — R279 Unspecified lack of coordination: Secondary | ICD-10-CM | POA: Insufficient documentation

## 2023-09-03 DIAGNOSIS — R293 Abnormal posture: Secondary | ICD-10-CM | POA: Diagnosis present

## 2023-09-03 DIAGNOSIS — C61 Malignant neoplasm of prostate: Secondary | ICD-10-CM | POA: Diagnosis present

## 2023-09-03 DIAGNOSIS — M6281 Muscle weakness (generalized): Secondary | ICD-10-CM | POA: Insufficient documentation

## 2023-09-03 MED ORDER — FLOTUFOLASTAT F 18 GALLIUM 296-5846 MBQ/ML IV SOLN
8.0000 | Freq: Once | INTRAVENOUS | Status: AC
  Administered 2023-09-03: 8.768 via INTRAVENOUS

## 2023-09-03 NOTE — Patient Instructions (Signed)
 Squatty potty: When your knees are level or below the level of your hips, pelvic floor muscles are pressed against rectum, preventing ease of bowel movement. By getting knees above the level of the hips, these pelvic floor muscles relax, allowing easier passage of bowel movement. ? Ways to get knees above hips: o Squatty Potty (7inch and 9inch versions) o Small stool o Roll of toilet paper under each foot o Hardback book or stack of magazines under each foot  Relaxed Toileting mechanics: Once in this position, make sure to lean forward with forearms on thighs, wide knees, relaxed stomach, and breathe.    Bladder retraining:  Drink 4-8oz an hour ONLY WATER  Stop water  intake 3 hours before bed Try to go at least 2-3 hours between trips to the bathroom - go whether you need to or not.    Urge Incontinence  Ideal urination frequency is every 2-4 wakeful hours, which equates to 5-8 times within a 24-hour period.   Urge incontinence is leakage that occurs when the bladder muscle contracts, creating a sudden need to go before getting to the bathroom.   Going too often when your bladder isn't actually full can disrupt the body's automatic signals to store and hold urine longer, which will increase urgency/frequency.  In this case, the bladder "is running the show" and strategies can be learned to retrain this pattern.   One should be able to control the first urge to urinate, at around .  The bladder can hold up to a "grande latte," or . To help you gain control, practice the Urge Drill below when urgency strikes.  This drill will help retrain your bladder signals and allow you to store and hold urine longer.  The overall goal is to stretch out your time between voids to reach a more manageable voiding schedule.    Practice your quick flicks often throughout the day (each waking hour) even when you don't need feel the urge to go.  This will help strengthen your pelvic floor  muscles, making them more effective in controlling leakage.  Urge Drill  When you feel an urge to go, follow these steps to regain control: Stop what you are doing and be still Take one deep breath, directing your air into your abdomen Think an affirming thought, such as "I've got this." Do 5 quick flicks of your pelvic floor Walk with control to the bathroom to void, or delay voiding   [facility] [address] [place], [place] [address] Phone # [phone] Fax [phone]

## 2023-09-03 NOTE — Therapy (Signed)
 OUTPATIENT PHYSICAL THERAPY MALE PELVIC EVALUATION   Patient Name: Menno Vanbergen MRN: [id number] DOB:December 10, 1960, 63 y.o., male Today's Date: 09/03/2023  END OF SESSION:  PT End of Session - 09/03/23 1008     Visit Number 1    Date for PT Re-Evaluation 02/18/24    Authorization Type Helathy blue    Authorization Time Period waiting on auth    PT Start Time 1015    PT Stop Time 1055    PT Time Calculation (min) 40 min    Activity Tolerance Patient tolerated treatment well    Behavior During Therapy [facility] for tasks assessed/performed          Past Medical History:  Diagnosis Date   Arthritis    KNEES   Cataract    REMOVED BILATERAL   Claustrophobia    Displaced fracture of base of third metacarpal bone, right hand, initial encounter for closed fracture 08/29/2016   GERD (gastroesophageal reflux disease)    with certain foods   Glaucoma (increased eye pressure)    Headache(784.0)    07/11/13- none in a long time   Hepatic steatosis    Hyperglycemia    Hyperlipidemia    Hypertension    no meds   Sleep apnea    Past Surgical History:  Procedure Laterality Date   CATARACT EXTRACTION W/PHACO Left 07/16/2013   Procedure: CATARACT EXTRACTION PHACO AND INTRAOCULAR LENS PLACEMENT (IOC) LEFT EYE;  Surgeon: Gaither Quan, MD;  Location: [facility] OR;  Service: Ophthalmology;  Laterality: Left;   CATARACT EXTRACTION W/PHACO Right 10/29/2013   Procedure: CATARACT EXTRACTION PHACO AND INTRAOCULAR LENS PLACEMENT (IOC);  Surgeon: Gaither Quan, MD;  Location: [facility] OR;  Service: Ophthalmology;  Laterality: Right;   EYE SURGERY     2 on left eye   FINGER SURGERY Right    MINI SHUNT INSERTION  03/09/2011   Procedure: INSERTION OF MINI SHUNT;  Surgeon: Gaither Quan, MD;  Location: [facility] OR;  Service: Ophthalmology;  Laterality: Left;   Patient Active Problem List   Diagnosis Date Noted   Liver cirrhosis secondary to NASH (HCC) 03/30/2023   Decreased GFR 02/21/2023   Chronic hip pain,  bilateral 02/20/2023   Palpitations 10/23/2022   Cervical spondylosis with radiculopathy 04/25/2022   Elevated PSA, less than 10 ng/ml 03/30/2022   Rib pain on left side 03/30/2022   Bone lesion 09/27/2019   Chronic pansinusitis 01/13/2019   Prediabetes 12/01/2018   Educated about COVID-19 virus infection 06/23/2018   Coronary artery disease due to calcified coronary lesion 12/30/2017   Abnormal screening cardiac CT 12/03/2017   Restless legs syndrome 11/02/2016   Thyroid  nodule 11/01/2016   Blind left eye 08/29/2016   Benign neoplasm of thyroid  08/18/2016   Laryngopharyngeal reflux (LPR) 08/18/2016   Multiple thyroid  nodules 07/25/2016   History of alcohol abuse 08/15/2015   Alcohol abuse 08/15/2015   Benign prostatic hyperplasia with incomplete bladder emptying 08/13/2015   Hyperlipidemia 07/15/2015   Current moderate episode of major depressive disorder without prior episode (HCC) 07/11/2015   Dyslipidemia 06/15/2015   Chronic gouty arthritis 06/15/2015   Exogenous obesity 06/15/2015   Cognitive complaints 05/24/2014   Neck pain 05/24/2014   Hepatic steatosis 05/17/2014   Chronic glaucoma 10/21/2013   POAG (primary open-angle glaucoma) 10/21/2013   Insomnia 11/17/2012   Erectile dysfunction of nonorganic origin 09/04/2012   Gouty arthritis 09/03/2012   Encounter for preventive health examination 09/03/2012   Preop cardiovascular exam 09/03/2012   Polyarthritis 07/28/2012   Dysphonia 07/28/2012  Glaucoma (increased eye pressure) 03/22/2011   HTN (hypertension) 03/22/2011    PCP: Marylynn Verneita CROME, MD  REFERRING PROVIDER: Renda Glance, MD   REFERRING DIAG: C61 (ICD-10-CM) - Prostate CA [facility])  THERAPY DIAG:  Muscle weakness (generalized)  Unspecified lack of coordination  Abnormal posture  Rationale for Evaluation and Treatment: Rehabilitation  ONSET DATE: 05/22/23  SUBJECTIVE:                                                                                                                                                                                            SUBJECTIVE STATEMENT: Pt was diagnosed with prostate cancer the end of March/beginning of April. He is waiting to schedule prostatectomy until he sees cardiologist to clear heart. Surgery will be scheduled ASAP. He not currently scheduled for any chemo/radiation.    PAIN:  Are you having pain? Yes NPRS scale: 5/10 Pain location: lower abdominal pain  Pain type: aching Pain description: aching   Aggravating factors: rolling in bed, coughing, laughing Relieving factors: being still  PRECAUTIONS: Other: prostate cancer  RED FLAGS: None   WEIGHT BEARING RESTRICTIONS: No  FALLS:  Has patient fallen in last 6 months? Yes. Number of falls 10  OCCUPATION: not working, lives alone  ACTIVITY LEVEL : not currently  PLOF: Independent  PATIENT GOALS: improve bowel/bladder function getting ready for surgery   PERTINENT HISTORY:  Prostate cancer, glaucoma, bil knee arthritis, GERD, peyronie's disease possibly and burst blood vessel in penis   BOWEL MOVEMENT: Pain with bowel movement: No Type of bowel movement:Type (Bristol Stool Scale) 3-6, Frequency 2-3 a day, sometimes 4-5 if he is having a flare up of irritable bowel, and Strain yes Fully empty rectum: No Leakage: Yes: typically with increased abdominal pressure with laughing, coughing, sneezing Pads: No Fiber supplement/laxative No  URINATION: Pain with urination: No - not usually, but sometimes a little stinging/burning - worse the longer he waits  Fully empty bladder: No Stream: can be hard to start stream, start and stop Urgency: Yes, without being productive   Frequency: every 2 hours during the day; nocturia 3-5x Fluid Intake: 4-5 bottles of water , 2 drinks a day, some caffeine  Leakage: Coughing, Sneezing, Laughing, Bending forward, and post void dribbling Pads: No  INTERCOURSE: Erection: 5+ years since being able  to have full erection Ejaculation: still can, diminished     OBJECTIVE:  Note: Objective measures were completed at Evaluation unless otherwise noted.   PATIENT SURVEYS:   PFIQ-7: 27  COGNITION: Overall cognitive status: Within functional limits for tasks assessed     SENSATION: Light touch: Appears intact   FUNCTIONAL TESTS:  Squat: loss of balance backwards indicating core weakness Single leg stance:  Rt: unable to balance, pelvic drop  Lt: unable to balance, pelvic drop  Curl-up test:4 finger width separation with distortion    GAIT: Assistive device utilized: None Comments: WNL  POSTURE: rounded shoulders, forward head, decreased lumbar lordosis, decreased thoracic kyphosis, and posterior pelvic tilt   LUMBARAROM/PROM:  A/PROM A/PROM  Eval (% available)  Flexion 50  Extension 50  Right lateral flexion 75  Left lateral flexion 75  Right rotation 50  Left rotation 50   (Blank rows = not tested)  PALPATION:   General: significant tightness throughout bil lumbar paraspinals and hips  Pelvic Alignment: WNL  Abdominal: significant tightness; distortion with pressure, tenderness with palpation of bil lower quadrants                External Perineal Exam: deferred                             Internal Pelvic Floor: deferred  Patient confirms identification and approves PT to assess internal pelvic floor and treatment No  PELVIC MMT: deferred   MMT eval  Internal Anal Sphincter   External Anal Sphincter   Puborectalis   Diastasis Recti   (Blank rows = not tested)        TONE: deferred   TODAY'S TREATMENT:                                                                                                                              DATE:  09/03/23 EVAL  Therapeutic activities: Bed mobility with abdominal pressure management Initial HEP to get active prior to surgery and gentle exercises afterwards  Bladder irritants  Strict voiding schedule,  especially after surgery     PATIENT EDUCATION:  Education details: See above Person educated: Patient Education method: Explanation, Demonstration, Tactile cues, Verbal cues, and Handouts Education comprehension: verbalized understanding  HOME EXERCISE PROGRAM: [id number]  ASSESSMENT:  CLINICAL IMPRESSION: Patient is a 63 y.o. male who was seen today for physical therapy evaluation and treatment for pre-operative pelvic floor physical therapy to achieve baseline and optimize pelvic floor muscle function prior to prostatectomy. Exam findings notable for abnormal posture, poor balance with squat and single leg stance also consistent with core weakness, 4 finger width diastasis recti abdominus with distortion, abdominal taughtness with urgency over bladder and tenderness in bil lower quadrant, and decreased lumbar A/ROM; no internal rectal exam performed today due to lack of time, but we discussed and will plan to perform next session. Signs and symptoms are most consistent with poor bladder/bowel habits, incomplete emptying of bladder and bowel, and pelvic floor muscle weakness (we will assess next session). Initial treatment consisted of HEP to focus on whole body strengthening and mobility, strict voiding schedule, bladder irritants, and bed mobility with appropriate pressure management. Pt will benefit from several pre-op appointments in order to assess  pelvic floor muscles rectally, optimize pelvic floor muscle function, optimize bowel/bladder habits, and improve mobility; he will likely continue to benefit from pelvic floor physical therapy after surgery in order to help return to prior level of function and decrease in bowel/bladder issues that are disruptive to quality of life.   OBJECTIVE IMPAIRMENTS: decreased activity tolerance, decreased coordination, decreased endurance, decreased mobility, decreased ROM, decreased strength, increased fascial restrictions, increased muscle spasms, impaired  flexibility, impaired tone, improper body mechanics, postural dysfunction, and pain.   ACTIVITY LIMITATIONS: bending, continence, and bed mobility  PARTICIPATION LIMITATIONS: cleaning, laundry, interpersonal relationship, and community activity  PERSONAL FACTORS: 1 comorbidity: medical history are also affecting patient's functional outcome.   REHAB POTENTIAL: Good  CLINICAL DECISION MAKING: Evolving/moderate complexity  EVALUATION COMPLEXITY: Moderate   GOALS: Goals reviewed with patient? Yes  SHORT TERM GOALS: Target date: 10/01/2023   Pt will be independent with HEP to improve activity tolerance.   Baseline: Goal status: INITIAL  2.  Pt will report 25% improvement in lower abdominal pain with bed mobility, laughing, coughing, and sneezing.  Baseline:  Goal status: INITIAL  3.  Pt will be independent with abdominal scar tissue self mobilization to help decrease restriction that may increase brief/pad use.  Baseline:  Goal status: INITIAL  4.  Pt will be independent with use of squatty potty, relaxed toileting mechanics, and improved bowel movement techniques in order to increase ease of bowel movements and complete evacuation.   Baseline:  Goal status: INITIAL  5.  Pt will be able to correctly perform diaphragmatic breathing and appropriate pressure management in order to prevent worsening diastasis rect abdominus, hernia, and decreasing pain with bed mobility.   Baseline:  Goal status: INITIAL   LONG TERM GOALS: Target date: 02/18/24  Pt will be independent with advanced HEP to improve activity tolerance.   Baseline:  Goal status: INITIAL  2.  Pt will improve PFIQ-7 to less than 20 in order to demonstrate improved bladder control and decreased risk of infection.  Baseline:  Goal status: INITIAL  3.  Pt will report 75% improvement in lower abdominal pain with bed mobility, laughing, coughing, and sneezing.  Baseline:  Goal status: INITIAL  4.  Pt will be  able to perform squat and single leg stance with no loss of balance in order to demonstrate functional hip strength that will allow him to begin walking program with reduced risk of injury. Baseline:  Goal status: INITIAL  5.  Pt will report no urinary or fecal incontinence with laughing, coughing, sneezing in order to improve comfort with interpersonal relationships and community activities.   Baseline:  Goal status: INITIAL  6.  Pt will be able to go 2-3 hours in between voids without urgency or incontinence in order to improve QOL, perform all functional activities with less difficulty, and use of no more than one brief/pad a day.   Baseline:  Goal status: INITIAL  PLAN:  PT FREQUENCY: 1-2x/week  PT DURATION: 6 months   PLANNED INTERVENTIONS: 97110-Therapeutic exercises, 97530- Therapeutic activity, 97112- Neuromuscular re-education, 97535- Self Care, 02859- Manual therapy, Dry Needling, and Biofeedback  PLAN FOR NEXT SESSION: perform internal rectal pelvic floor muscle assessment; core training; progress pre-operative strengthening program to tolerance  Josette Mares, PT, DPT07/14/253:01 PM

## 2023-09-04 NOTE — Telephone Encounter (Signed)
  Patient would like someone to call him to discuss his results

## 2023-09-04 NOTE — Telephone Encounter (Signed)
 LVM for pt to call regarding test results

## 2023-09-05 NOTE — Telephone Encounter (Signed)
 Left message for the patient to call back.

## 2023-09-06 NOTE — Telephone Encounter (Signed)
 Left message for the patient to call back.

## 2023-09-06 NOTE — Telephone Encounter (Signed)
 Patient is returning call.

## 2023-09-07 ENCOUNTER — Telehealth: Payer: Self-pay | Admitting: Cardiology

## 2023-09-07 ENCOUNTER — Other Ambulatory Visit: Payer: Self-pay | Admitting: Urology

## 2023-09-07 NOTE — Telephone Encounter (Signed)
   Patient Name: Jeff [name]  DOB: 15-Nov-1960 MRN: [id number]  Primary Cardiologist: Redell Shallow, MD  Chart reviewed as part of pre-operative protocol coverage. Patient was seen by Dr. Bernie on 06/28/23 with pending prostate surgery. Due to upcoming surgery, Dr. Bernie arranged echocardiogram and lexiscan  stress test. These tests have since been completed with reassuring findings. Per Dr. Bernie: Stress test negative, echocardiogram preserved ejection fraction should be acceptable risk for surgery.  I will fax this to requesting surgical team and close the encounter. Please call with any further questions.   Artist Pouch, PA-C 09/07/2023, 4:57 PM

## 2023-09-07 NOTE — Telephone Encounter (Signed)
   Pre-operative Risk Assessment    Patient Name: Jeff [name]  DOB: 02/21/1960 MRN: [id number]      Request for Surgical Clearance    Procedure:  robot assisted laparoscopic radical prostatectomy & bilat pelvic lymphadenectomy  Date of Surgery:  Clearance 11/26/23                                 Surgeon:  Dr Renda Socks Group or Practice Name:  [facility] Phone number:  [phone] Fax number:  [phone]   Type of Clearance Requested:   - Medical    Type of Anesthesia:  General    Additional requests/questions:  None  Signed, April L Harrington   09/07/2023, 4:22 PM

## 2023-09-10 ENCOUNTER — Other Ambulatory Visit: Payer: Self-pay | Admitting: Urology

## 2023-09-10 NOTE — Telephone Encounter (Signed)
 Pt returning call to a nurse for results and wants to talk to someone about the results

## 2023-09-28 ENCOUNTER — Ambulatory Visit: Attending: Cardiology | Admitting: Cardiology

## 2023-09-28 VITALS — BP 155/100 | HR 75 | Ht 71.0 in | Wt 232.0 lb

## 2023-09-28 DIAGNOSIS — R072 Precordial pain: Secondary | ICD-10-CM | POA: Insufficient documentation

## 2023-09-28 DIAGNOSIS — I2510 Atherosclerotic heart disease of native coronary artery without angina pectoris: Secondary | ICD-10-CM | POA: Diagnosis present

## 2023-09-28 DIAGNOSIS — E7800 Pure hypercholesterolemia, unspecified: Secondary | ICD-10-CM | POA: Insufficient documentation

## 2023-09-28 DIAGNOSIS — I2584 Coronary atherosclerosis due to calcified coronary lesion: Secondary | ICD-10-CM | POA: Insufficient documentation

## 2023-09-28 DIAGNOSIS — Z01810 Encounter for preprocedural cardiovascular examination: Secondary | ICD-10-CM | POA: Diagnosis present

## 2023-09-28 DIAGNOSIS — I10 Essential (primary) hypertension: Secondary | ICD-10-CM | POA: Insufficient documentation

## 2023-09-28 DIAGNOSIS — E785 Hyperlipidemia, unspecified: Secondary | ICD-10-CM | POA: Diagnosis present

## 2023-09-28 MED ORDER — ISOSORBIDE MONONITRATE ER 30 MG PO TB24: 30.0000 mg | ORAL_TABLET | Freq: Every day | ORAL | 3 refills | Status: AC

## 2023-09-28 NOTE — Progress Notes (Signed)
 Cardiology Office Note:    Date:  09/28/2023   ID:  Burle Kwan, DOB 23-Jan-1961, MRN [id number]  PCP:  Marylynn Verneita CROME, MD  Cardiologist:  Lamar Fitch, MD    Referring MD: Fitch Lamar PARAS, MD   No chief complaint on file.   History of Present Illness:    Jeff [name] is a 63 y.o. male past medical history significant for hyperglycemia, hyperlipidemia, essential hypertension still poorly controlled, he was referred to us  for evaluation before elective prostatectomy.  Evaluation so far included echocardiogram stress test both did not show any significant abnormality but today he comes for regular follow-up to discuss this and he tells me he started getting chest pain.  He said pain happens sometimes when he tried to do things he does have some difficulty describing it he tells me the pain can last for days he feels like he cannot take a deep breath.  It is associated with some shortness of breath.  He also tells me that he was able to walk much better now he have decreased ability to exercise because of the sensation.  Still restarted became somewhat more concerning.  My intention today was described to him that his tests are normal and he is good to go for surgery but now he came up with this different kind of story and much more concerning.  Past Medical History:  Diagnosis Date   Arthritis    KNEES   Cataract    REMOVED BILATERAL   Claustrophobia    Displaced fracture of base of third metacarpal bone, right hand, initial encounter for closed fracture 08/29/2016   GERD (gastroesophageal reflux disease)    with certain foods   Glaucoma (increased eye pressure)    Headache(784.0)    07/11/13- none in a long time   Hepatic steatosis    Hyperglycemia    Hyperlipidemia    Hypertension    no meds   Sleep apnea     Past Surgical History:  Procedure Laterality Date   CATARACT EXTRACTION W/PHACO Left 07/16/2013   Procedure: CATARACT EXTRACTION PHACO AND INTRAOCULAR  LENS PLACEMENT (IOC) LEFT EYE;  Surgeon: Gaither Quan, MD;  Location: [facility] OR;  Service: Ophthalmology;  Laterality: Left;   CATARACT EXTRACTION W/PHACO Right 10/29/2013   Procedure: CATARACT EXTRACTION PHACO AND INTRAOCULAR LENS PLACEMENT (IOC);  Surgeon: Gaither Quan, MD;  Location: [facility] OR;  Service: Ophthalmology;  Laterality: Right;   EYE SURGERY     2 on left eye   FINGER SURGERY Right    MINI SHUNT INSERTION  03/09/2011   Procedure: INSERTION OF MINI SHUNT;  Surgeon: Gaither Quan, MD;  Location: [facility] OR;  Service: Ophthalmology;  Laterality: Left;    Current Medications: Current Meds  Medication Sig   allopurinol  (ZYLOPRIM ) 100 MG tablet Take 2 tablets (200 mg total) by mouth daily.   Blood Pressure Monitoring (BLOOD PRESSURE MONITOR & DTX) KIT Check blood pressure once a day (Patient taking differently: 1 each by Other route daily. Check blood pressure once a day)   carvedilol  (COREG ) 3.125 MG tablet TAKE 1 TABLET BY MOUTH TWICE A DAY WITH A MEAL (Patient taking differently: Take 3.125 mg by mouth 2 (two) times daily with a meal.)   colchicine  0.6 MG tablet TAKE 1 TABLET BY MOUTH HOURLY UNTIL GOUT IMPROVED/DIARRHEA,ON 1ST DAY OF TREATMENT,THEN 1 TAB DAILY THEREAFTER IF NEEDED (Patient taking differently: Take 0.6 mg by mouth See admin instructions. TAKE 1 TABLET BY MOUTH HOURLY UNTIL GOUT IMPROVED/DIARRHEA,ON 1ST DAY  OF TREATMENT,THEN 1 TAB DAILY THEREAFTER IF NEEDED)   COSOPT PF 2-0.5 % SOLN Place 1 drop into the right eye 2 (two) times daily.   losartan  (COZAAR ) 100 MG tablet TAKE 1 TABLET BY MOUTH EVERY DAY   pantoprazole  (PROTONIX ) 40 MG tablet Take 1 tablet (40 mg total) by mouth 2 (two) times daily.   sertraline  (ZOLOFT ) 50 MG tablet Take 1 tablet (50 mg total) by mouth daily. (Patient taking differently: Take 50 mg by mouth as needed (amxiety).)   sildenafil  (REVATIO ) 20 MG tablet Take 20 mg by mouth 3 (three) times daily.   traMADol  (ULTRAM ) 50 MG tablet Take 1 tablet (50 mg total) by  mouth every 8 (eight) hours as needed.     Allergies:   Keflex [cephalexin]   Social History   Socioeconomic History   Marital status: Divorced    Spouse name: Not on file   Number of children: 0   Years of education: college3   Highest education level: Not on file  Occupational History   Occupation: Aeronautical engineer: [employer]  Tobacco Use   Smoking status: Never   Smokeless tobacco: Never  Vaping Use   Vaping status: Never Used  Substance and Sexual Activity   Alcohol use: Yes    Alcohol/week: 2.0 standard drinks of alcohol    Types: 2 Cans of beer per week    Comment: daily   Drug use: No   Sexual activity: Not on file  Other Topics Concern   Not on file  Social History Narrative   Not on file   Social Drivers of Health   Financial Resource Strain: Not on file  Food Insecurity: Not on file  Transportation Needs: Not on file  Physical Activity: Unknown (01/08/2017)   Received from [facility]   Exercise Vital Sign    Days of Exercise per Week: 2 days    Minutes of Exercise per Session: Not on file  Stress: Not on file  Social Connections: Not on file     Family History: The patient's family history includes Aneurysm in his brother and father; Asthma in his father, mother, and paternal aunt; Early death in his brother and sister; Heart disease in his father, mother, and sister; Hyperlipidemia in his father and mother; Hypertension in his father and mother; Liver cancer in his mother; Stroke in his father; Thyroid  disease in his mother. There is no history of Migraines. ROS:   Please see the history of present illness.    All 14 point review of systems negative except as described per history of present illness  EKGs/Labs/Other Studies Reviewed:         Recent Labs: 06/28/2023: ALT 137; BUN 15; Creatinine, Ser 0.92; Potassium 4.8; Sodium 138  Recent Lipid Panel    Component Value Date/Time   CHOL 183 06/28/2023 0955   TRIG 235 (H)  06/28/2023 0955   HDL 59 06/28/2023 0955   CHOLHDL 3.1 06/28/2023 0955   CHOLHDL 3.5 11/20/2022 1351   VLDL 26.6 07/04/2022 1451   LDLCALC 85 06/28/2023 0955   LDLCALC 132 (H) 11/20/2022 1351   LDLDIRECT 150.0 03/24/2022 1019    Physical Exam:    VS:  BP (!) 155/100 (BP Location: Left Arm)   Pulse 75   Ht 5' 11 (1.803 m)   Wt 232 lb (105.2 kg)   SpO2 95%   BMI 32.36 kg/m     Wt Readings from Last 3 Encounters:  09/28/23 232 lb (105.2  kg)  07/25/23 226 lb (102.5 kg)  06/28/23 226 lb (102.5 kg)     GEN:  Well nourished, well developed in no acute distress HEENT: Normal NECK: No JVD; No carotid bruits LYMPHATICS: No lymphadenopathy CARDIAC: RRR, no murmurs, no rubs, no gallops RESPIRATORY:  Clear to auscultation without rales, wheezing or rhonchi  ABDOMEN: Soft, non-tender, non-distended MUSCULOSKELETAL:  No edema; No deformity  SKIN: Warm and dry LOWER EXTREMITIES: no swelling NEUROLOGIC:  Alert and oriented x 3 PSYCHIATRIC:  Normal affect   ASSESSMENT:    1. Primary hypertension   2. Coronary artery disease due to calcified coronary lesion   3. Dyslipidemia   4. Pure hypercholesterolemia   5. Preop cardiovascular exam    PLAN:    In order of problems listed above:  Calcification of the coronary artery, stress test normal but now he comes in talk to me about potentially having chest pain.  I will start antiplatelets therapy I will put him Imdur  30.  Will schedule him to have coronary CT angio make sure he does not have any balanced ischemia that can be missed on stress test. Essential hypertension blood pressure not controlled but hopefully with addition of Imdur  things will get better. Dyslipidemia I did review K PN which show me his LDL 85 HDL 59.  Will wait for results of coronary CT angio to decide about how aggressive we need to be with management of his cholesterol. Cardiovascular preop evaluation he was assessed acceptable risk but now with new  symptomatology need to have some additional testing to clarify that   Medication Adjustments/Labs and Tests Ordered: Current medicines are reviewed at length with the patient today.  Concerns regarding medicines are outlined above.  No orders of the defined types were placed in this encounter.  Medication changes: No orders of the defined types were placed in this encounter.   Signed, Lamar DOROTHA Fitch, MD, [facility] 09/28/2023 9:33 AM    [facility]

## 2023-09-28 NOTE — Patient Instructions (Addendum)
 Medication Instructions:   No Carvedilol  morning of CT Scan  TAKE: Metoprolol  100mg  1 tablet 2 hours prior to CT Scan  START: Imdur  30mg  1 tablet daily   Lab Work: None Ordered If you have labs (blood work) drawn today and your tests are completely normal, you will receive your results only by: MyChart Message (if you have MyChart) OR A paper copy in the mail If you have any lab test that is abnormal or we need to change your treatment, we will call you to review the results.   Testing/Procedures:  Your cardiac CT will be scheduled at one of the below locations:   [facility] [address] [place], [place] [address] [phone]  Please follow these instructions carefully (unless otherwise directed):  Hold all erectile dysfunction medications at least 3 days (72 hrs) prior to test.  On the Night Before the Test: Be sure to Drink plenty of water . Do not consume any caffeinated/decaffeinated beverages or chocolate 12 hours prior to your test. Do not take any antihistamines 12 hours prior to your test.  On the Day of the Test: Drink plenty of water  until 1 hour prior to the test. Do not eat any food 4 hours prior to the test. You may take your regular medications prior to the test.  Take metoprolol  (Lopressor ) two hours prior to test.        After the Test: Drink plenty of water . After receiving IV contrast, you may experience a mild flushed feeling. This is normal. On occasion, you may experience a mild rash up to 24 hours after the test. This is not dangerous. If this occurs, you can take Benadryl 25 mg and increase your fluid intake. If you experience trouble breathing, this can be serious. If it is severe call 911 IMMEDIATELY. If it is mild, please call our office. If you take any of these medications: Glipizide/Metformin, Avandament, Glucavance, please do not take 48 hours after completing test unless otherwise instructed.  We will call to schedule your test  2-4 weeks out understanding that some insurance companies will need an authorization prior to the service being performed.   For non-scheduling related questions, please contact the cardiac imaging nurse navigator should you have any questions/concerns: Camie Shutter, Cardiac Imaging Nurse Navigator Chantal Requena, Cardiac Imaging Nurse Navigator [facility] Direct Office Dial: [phone]   For scheduling needs, including cancellations and rescheduling, please call [place], [phone].    Follow-Up: At [facility], you and your health needs are our priority.  As part of our continuing mission to provide you with exceptional heart care, we have created designated Provider Care Teams.  These Care Teams include your primary Cardiologist (physician) and Advanced Practice Providers (APPs -  Physician Assistants and Nurse Practitioners) who all work together to provide you with the care you need, when you need it.  We recommend signing up for the patient portal called MyChart.  Sign up information is provided on this After Visit Summary.  MyChart is used to connect with patients for Virtual Visits (Telemedicine).  Patients are able to view lab/test results, encounter notes, upcoming appointments, etc.  Non-urgent messages can be sent to your provider as well.   To learn more about what you can do with MyChart, go to [email or web address].    Your next appointment:   5 month(s)  The format for your next appointment:   In Person  Provider:   Lamar Fitch, MD    Other Instructions NA

## 2023-09-29 LAB — LIPID PANEL
Chol/HDL Ratio: 5.8 ratio — ABNORMAL HIGH (ref 0.0–5.0)
Cholesterol, Total: 334 mg/dL — ABNORMAL HIGH (ref 100–199)
HDL: 58 mg/dL (ref >39)
LDL Chol Calc (NIH): 209 mg/dL — ABNORMAL HIGH (ref 0–99)
Triglycerides: 331 mg/dL — ABNORMAL HIGH (ref 0–149)
VLDL Cholesterol Cal: 67 mg/dL — ABNORMAL HIGH (ref 5–40)

## 2023-09-29 LAB — AST: AST: 134 IU/L — ABNORMAL HIGH (ref 0–40)

## 2023-09-29 LAB — ALT: ALT: 166 IU/L — ABNORMAL HIGH (ref 0–44)

## 2023-10-03 ENCOUNTER — Telehealth: Payer: Self-pay | Admitting: Cardiology

## 2023-10-03 NOTE — Telephone Encounter (Signed)
 Pt calling to f/u on lab results. Please advise

## 2023-10-03 NOTE — Telephone Encounter (Signed)
 Called the patient and informed him that the results of his lab work had not been interpreted by the physician at this time. I explained that I would send him a message explaining that the patient was asking about his results. Patient verbalized understanding and had no further questions at this time.

## 2023-10-10 ENCOUNTER — Ambulatory Visit

## 2023-10-14 ENCOUNTER — Other Ambulatory Visit: Payer: Self-pay | Admitting: Internal Medicine

## 2023-10-16 NOTE — Telephone Encounter (Signed)
 Prescription was discontinued by you on 02/19/23. Please refuse.

## 2023-10-17 ENCOUNTER — Ambulatory Visit

## 2023-10-24 ENCOUNTER — Ambulatory Visit

## 2023-10-31 ENCOUNTER — Encounter (HOSPITAL_COMMUNITY): Payer: Self-pay

## 2023-11-01 ENCOUNTER — Ambulatory Visit

## 2023-11-07 ENCOUNTER — Encounter (HOSPITAL_COMMUNITY): Payer: Self-pay

## 2023-11-07 ENCOUNTER — Telehealth (HOSPITAL_COMMUNITY): Payer: Self-pay | Admitting: *Deleted

## 2023-11-07 ENCOUNTER — Telehealth (HOSPITAL_COMMUNITY): Payer: Self-pay | Admitting: Emergency Medicine

## 2023-11-07 NOTE — Telephone Encounter (Signed)
 Attempted to call patient regarding upcoming cardiac CT appointment. Left message on voicemail with name and callback number Sid Seats RN Navigator Cardiac Imaging [facility] [phone] Office

## 2023-11-07 NOTE — Telephone Encounter (Signed)
 Reaching out to patient to offer assistance regarding upcoming cardiac imaging study; pt verbalizes understanding of appt date/time, parking situation and where to check in, pre-test NPO status and medications ordered, and verified current allergies; name and call back number provided for further questions should they arise Rockwell Alexandria RN Navigator Cardiac Imaging [facility] [phone] office [phone] cell

## 2023-11-08 ENCOUNTER — Ambulatory Visit (INDEPENDENT_AMBULATORY_CARE_PROVIDER_SITE_OTHER)
Admission: RE | Admit: 2023-11-08 | Discharge: 2023-11-08 | Disposition: A | Discharge status: Home / Self Care | Source: Ambulatory Visit | Attending: Cardiology | Admitting: Cardiology

## 2023-11-08 DIAGNOSIS — R072 Precordial pain: Secondary | ICD-10-CM

## 2023-11-08 MED ORDER — IOHEXOL 350 MG/ML SOLN
100.0000 mL | Freq: Once | INTRAVENOUS | Status: AC | PRN
Start: 2023-11-08 — End: 2023-11-08
  Administered 2023-11-08: 100 mL via INTRAVENOUS

## 2023-11-08 MED ORDER — NITROGLYCERIN 0.4 MG SL SUBL
0.8000 mg | SUBLINGUAL_TABLET | Freq: Once | SUBLINGUAL | Status: AC
  Administered 2023-11-08: 0.8 mg via SUBLINGUAL

## 2023-11-12 ENCOUNTER — Encounter (HOSPITAL_COMMUNITY): Payer: Self-pay

## 2023-11-13 ENCOUNTER — Ambulatory Visit: Payer: Self-pay | Admitting: Cardiology

## 2023-11-15 ENCOUNTER — Telehealth: Payer: Self-pay

## 2023-11-15 NOTE — Telephone Encounter (Signed)
 Left message on My Chart with CT Angio results per Dr. Karry note. Routed to PCP.

## 2023-11-20 NOTE — Telephone Encounter (Signed)
 Not sure what else is needed. Am refaxing clearance from [employer]. There was no request to hold medications so unsure of their need/question?

## 2023-11-20 NOTE — Telephone Encounter (Signed)
 I will forward to preop APP to review note from requesting office today.   I have reviewed the notes from [facility], [facility] which seems to reflect the pt has been cleared.

## 2023-11-20 NOTE — Telephone Encounter (Signed)
 [facility] calling for a update and clear instruction.

## 2023-11-21 NOTE — Progress Notes (Signed)
 Interview patient over the phone. He will need labs done DOS. Patient has poor vision and struggles to read emails. Went over instructions verbally and he wrote them down. Transferred call to admitting.  Patient  has claustrophobia, wants staff to know that he struggles being hooked up to IVs and multiple things.  COVID Vaccine Completed: yes  Date of COVID positive in last 90 days:  PCP - Verneita Kettering, MD Cardiologist - Redell Shallow, MD- has been seeing Dr. Bernie  Cardiac clearance by Redell Goldmann, MD 09/07/23 in Epic  CT coronary- 11/08/23 Epic PET- 09/03/23 Epic Chest x-ray - N/A EKG - 06/28/23 Epic Stress Test - 07/25/23 Epic ECHO - 07/25/23 Epic Cardiac Cath - n/a Pacemaker/ICD device last checked:N/A Spinal Cord Stimulator:N/A  Bowel Prep - clears day before, mag citrate and  fleet enema  Sleep Study - yes CPAP - needs a machine  Fasting Blood Sugar - preDM Checks Blood Sugar _____ times a day  Last dose of GLP1 agonist-  N/A GLP1 instructions:  Do not take after     Last dose of SGLT-2 inhibitors-  N/A SGLT-2 instructions:  Do not take after     Blood Thinner Instructions: N/A Last dose:   Time: Aspirin Instructions:N/A Last Dose:  Activity level: Can go up a flight of stairs and perform activities of daily living without stopping and without symptoms of chest pain or shortness of breath.   Anesthesia review: HTN, CAD, cirrhosis, preDM, OSA, need cardiac clearance??  Patient denies shortness of breath, fever, cough and chest pain at PAT appointment  Patient verbalized understanding of instructions that were given to them at the PAT appointment. Patient was also instructed that they will need to review over the PAT instructions again at home before surgery.

## 2023-11-21 NOTE — Patient Instructions (Addendum)
 SURGICAL WAITING ROOM VISITATION  Patients having surgery or a procedure may have no more than 2 support people in the waiting area - these visitors may rotate.    Children under the age of 87 must have an adult with them who is not the patient.  Visitors with respiratory illnesses are discouraged from visiting and should remain at home.  If the patient needs to stay at the hospital during part of their recovery, the visitor guidelines for inpatient rooms apply. Pre-op nurse will coordinate an appropriate time for 1 support person to accompany patient in pre-op.  This support person may not rotate.    Please refer to the [facility] website for the visitor guidelines for Inpatients (after your surgery is over and you are in a regular room).    Your procedure is scheduled on: 11/26/23   Report to [facility] Main Entrance    Report to admitting at 5:15 AM   Call this number if you have problems the morning of surgery [phone]   Follow a clear liquid diet the day before surgery.  Water  Non-Citrus Juices (without pulp, NO RED-Apple, White grape, White cranberry) Black Coffee (NO MILK/CREAM OR CREAMERS, sugar ok)  Clear Tea (NO MILK/CREAM OR CREAMERS, sugar ok) regular and decaf                             Plain Jell-O (NO RED)                                           Fruit ices (not with fruit pulp, NO RED)                                     Popsicles (NO RED)                                                               Sports drinks like Gatorade (NO RED)              Nothing to drink after midnight.          If you have questions, please contact your surgeon's office.   FOLLOW BOWEL PREP AND ANY ADDITIONAL PRE OP INSTRUCTIONS YOU RECEIVED FROM YOUR SURGEON'S OFFICE!!!     Oral Hygiene is also important to reduce your risk of infection.                                    Remember - BRUSH YOUR TEETH THE MORNING OF SURGERY WITH YOUR REGULAR TOOTHPASTE  DENTURES  WILL BE REMOVED PRIOR TO SURGERY PLEASE DO NOT APPLY Poly grip OR ADHESIVES!!!   Stop all vitamins and herbal supplements 7 days before surgery.   Take these medicines the morning of surgery with A SIP OF WATER : Carvedilol , Allopurinol , Pantoprazole , Sertraline   You may not have any metal on your body including jewelry, and body piercing             Do not wear lotions, powders, cologne, or deodorant              Men may shave face and neck.   Do not bring valuables to the hospital. [place] [facility] IS NOT             RESPONSIBLE   FOR VALUABLES.   Contacts, glasses, dentures or bridgework may not be worn into surgery.   Bring small overnight bag day of surgery.   DO NOT BRING YOUR HOME MEDICATIONS TO THE HOSPITAL. PHARMACY WILL DISPENSE MEDICATIONS LISTED ON YOUR MEDICATION LIST TO YOU DURING YOUR ADMISSION IN THE HOSPITAL!              Please read over the following fact sheets you were given: IF YOU HAVE QUESTIONS ABOUT YOUR PRE-OP INSTRUCTIONS PLEASE CALL [phone] [place].   Uncertain - Preparing for Surgery Before surgery, you can play an important role.  Because skin is not sterile, your skin needs to be as free of germs as possible.  You can reduce the number of germs on your skin by washing with CHG (chlorahexidine gluconate) soap before surgery.  CHG is an antiseptic cleaner which kills germs and bonds with the skin to continue killing germs even after washing. Please DO NOT use if you have an allergy to CHG or antibacterial soaps.  If your skin becomes reddened/irritated stop using the CHG and inform your nurse when you arrive at Short Stay. Do not shave (including legs and underarms) for at least 48 hours prior to the first CHG shower.  You may shave your face/neck.  Please follow these instructions carefully:  1.  Shower with CHG Soap the night before surgery ONLY (DO NOT USE THE SOAP THE MORNING OF SURGERY).  2.  If you choose to wash your  hair, wash your hair first as usual with your normal  shampoo.  3.  After you shampoo, rinse your hair and body thoroughly to remove the shampoo.                             4.  Use CHG as you would any other liquid soap.  You can apply chg directly to the skin and wash.  Gently with a scrungie or clean washcloth.  5.  Apply the CHG Soap to your body ONLY FROM THE NECK DOWN.   Do   not use on face/ open                           Wound or open sores. Avoid contact with eyes, ears mouth and   genitals (private parts).                       Wash face,  Genitals (private parts) with your normal soap.             6.  Wash thoroughly, paying special attention to the area where your    surgery  will be performed.  7.  Thoroughly rinse your body with warm water  from the neck down.  8.  DO NOT shower/wash with your normal soap after using and rinsing off the CHG Soap.  9.  Pat yourself dry with a clean towel.            10.  Wear clean pajamas.            11.  Place clean sheets on your bed the night of your first shower and do not  sleep with pets. Day of Surgery : Do not apply any CHG, lotions/deodorants the morning of surgery.  Please wear clean clothes to the hospital/surgery center.  FAILURE TO FOLLOW THESE INSTRUCTIONS MAY RESULT IN THE CANCELLATION OF YOUR SURGERY  PATIENT SIGNATURE_________________________________  NURSE SIGNATURE__________________________________  ________________________________________________________________________  ________________________________________________________________________ WHAT IS A BLOOD TRANSFUSION? Blood Transfusion Information  A transfusion is the replacement of blood or some of its parts. Blood is made up of multiple cells which provide different functions. Red blood cells carry oxygen and are used for blood loss replacement. White blood cells fight against infection. Platelets control bleeding. Plasma helps clot blood. Other  blood products are available for specialized needs, such as hemophilia or other clotting disorders. BEFORE THE TRANSFUSION  Who gives blood for transfusions?  Healthy volunteers who are fully evaluated to make sure their blood is safe. This is blood bank blood. Transfusion therapy is the safest it has ever been in the practice of medicine. Before blood is taken from a donor, a complete history is taken to make sure that person has no history of diseases nor engages in risky social behavior (examples are intravenous drug use or sexual activity with multiple partners). The donor's travel history is screened to minimize risk of transmitting infections, such as malaria. The donated blood is tested for signs of infectious diseases, such as HIV and hepatitis. The blood is then tested to be sure it is compatible with you in order to minimize the chance of a transfusion reaction. If you or a relative donates blood, this is often done in anticipation of surgery and is not appropriate for emergency situations. It takes many days to process the donated blood. RISKS AND COMPLICATIONS Although transfusion therapy is very safe and saves many lives, the main dangers of transfusion include:  Getting an infectious disease. Developing a transfusion reaction. This is an allergic reaction to something in the blood you were given. Every precaution is taken to prevent this. The decision to have a blood transfusion has been considered carefully by your caregiver before blood is given. Blood is not given unless the benefits outweigh the risks. AFTER THE TRANSFUSION Right after receiving a blood transfusion, you will usually feel much better and more energetic. This is especially true if your red blood cells have gotten low (anemic). The transfusion raises the level of the red blood cells which carry oxygen, and this usually causes an energy increase. The nurse administering the transfusion will monitor you carefully for  complications. HOME CARE INSTRUCTIONS  No special instructions are needed after a transfusion. You may find your energy is better. Speak with your caregiver about any limitations on activity for underlying diseases you may have. SEEK MEDICAL CARE IF:  Your condition is not improving after your transfusion. You develop redness or irritation at the intravenous (IV) site. SEEK IMMEDIATE MEDICAL CARE IF:  Any of the following symptoms occur over the next 12 hours: Shaking chills. You have a temperature by mouth above 102 F (38.9 C), not controlled by medicine. Chest, back, or muscle pain. People around you feel you are not acting correctly or are confused. Shortness of breath or difficulty breathing. Dizziness and fainting. You get  a rash or develop hives. You have a decrease in urine output. Your urine turns a dark color or changes to pink, red, or brown. Any of the following symptoms occur over the next 10 days: You have a temperature by mouth above 102 F (38.9 C), not controlled by medicine. Shortness of breath. Weakness after normal activity. The white part of the eye turns yellow (jaundice). You have a decrease in the amount of urine or are urinating less often. Your urine turns a dark color or changes to pink, red, or brown. Document Released: 02/04/2000 Document Revised: 05/01/2011 Document Reviewed: 09/23/2007 [facility] Patient Information 2014 [place], [place].  _______________________________________________________________________

## 2023-11-22 ENCOUNTER — Encounter (HOSPITAL_COMMUNITY)
Admission: RE | Admit: 2023-11-22 | Discharge: 2023-11-22 | Disposition: A | Discharge status: Home / Self Care | Source: Ambulatory Visit | Attending: Urology | Admitting: Urology

## 2023-11-22 ENCOUNTER — Encounter (HOSPITAL_COMMUNITY): Payer: Self-pay

## 2023-11-22 ENCOUNTER — Other Ambulatory Visit: Payer: Self-pay

## 2023-11-22 HISTORY — DX: Malignant (primary) neoplasm, unspecified: C80.1

## 2023-11-22 HISTORY — DX: Unqualified visual loss, both eyes: H54.3

## 2023-11-22 HISTORY — DX: Fatty (change of) liver, not elsewhere classified: K76.0

## 2023-11-22 HISTORY — DX: Prediabetes: R73.03

## 2023-11-23 NOTE — Progress Notes (Signed)
 Anesthesia Chart Review: Same day workup   63 year old male with pertinent history including HTN, hepatic steatosis, prediabetes, OSA not on CPAP, GERD on PPI.  Patient recently referred to cardiology for evaluation prior to undergoing radical prostatectomy.  He was seen by Dr. Krasowski and Lexiscan  stress test and echocardiogram were ordered which were both benign and he was cleared for surgery based on these results.  However, at follow-up with Dr. Krasowski on 09/28/2023, patient reported some atypical chest pain.  Coronary CTA was ordered for further evaluation.  Study done 11/08/2023 showed mild nonobstructive CAD.  Recently elevated transaminases after restarting Crestor .  CMP 06/28/2023 showed AST 130 and ALT 137 (up from 43 and 44 on 02/19/2023).  Crestor  was discontinued at that time.  Recheck on 09/28/2023 showed stable elevations with AST 134 and ALT 166.  Patient will need day of surgery labs and evaluation.  EKG 09/28/2023: NSR.  Rate 74.  Coronary CTA 11/08/2023: IMPRESSION: 1. Coronary calcium  score of 538. This was 90 percentile for age and sex matched control.   2. Normal coronary origin with right dominance.   3. CAD-RADS 2. Mild non-obstructive CAD (25-49%) mid LAD. Consider non-atherosclerotic causes of chest pain. Consider preventive therapy and risk factor modification.  Nuclear stress 07/25/2023:   The study is normal. The study is low risk.   No ST deviation was noted.   Left ventricular function is normal. Nuclear stress EF: 64%. The left ventricular ejection fraction is normal (55-65%). End diastolic cavity size is normal.  TTE 07/25/2022: 1. Technically difficult study.   2. Left ventricular ejection fraction, by estimation, is 60 to 65%. The  left ventricle has normal function. The left ventricle has no regional  wall motion abnormalities. Left ventricular diastolic parameters were  normal. The average left ventricular  global longitudinal strain is -18.3 %. The  global longitudinal strain is  normal.   3. Right ventricular systolic function is normal. The right ventricular  size is normal. There is normal pulmonary artery systolic pressure.   4. The mitral valve is grossly normal. Mild mitral valve regurgitation.  No evidence of mitral stenosis.   5. The aortic valve was not well visualized. Aortic valve regurgitation  is mild. No aortic stenosis is present. Aortic valve Vmax measures 1.59  m/s.   6. There is borderline dilatation of the descending aorta, measuring 27  mm.   7. The inferior vena cava is normal in size with greater than 50%  respiratory variability, suggesting right atrial pressure of 3 mmHg.     Lynwood Geofm [facility]IG[facility]ERS [facility] Short Stay Center/Anesthesiology Phone [phone] 11/23/2023 8:47 AM

## 2023-11-23 NOTE — Telephone Encounter (Signed)
   Patient Name: Jeff [name]  DOB: Feb 15, 1961 MRN: [id number]  Primary Cardiologist: Redell Shallow, MD  Chart reviewed as part of pre-operative protocol coverage. Given past medical history and time since last visit, based on ACC/AHA guidelines, [name] is at acceptable risk for the planned procedure on 11/26/2023 without further cardiovascular testing.   Dr. Krasowski arranged echocardiogram and lexiscan  stress test. These tests have since been completed with reassuring findings. Per Dr. Bernie: Stress test negative, echocardiogram preserved ejection fraction should be acceptable risk for surgery.   The patient was advised that if he develops new symptoms prior to surgery to contact our office to arrange for a follow-up visit, and he verbalized understanding.  I will route this recommendation to the requesting party via Epic fax function and remove from pre-op pool.  Please call with questions.  Lamarr Satterfield, NP 11/23/2023, 9:26 AM

## 2023-11-23 NOTE — Telephone Encounter (Signed)
 Patient has been cleared. Will re-fax the note.

## 2023-11-23 NOTE — Anesthesia Preprocedure Evaluation (Addendum)
 Anesthesia Evaluation  Patient identified by MRN, date of birth, ID band Patient awake    Reviewed: Allergy & Precautions, NPO status , Patient's Chart, lab work & pertinent test results, reviewed documented beta blocker date and time   History of Anesthesia Complications Negative for: history of anesthetic complications  Airway Mallampati: II  TM Distance: >3 FB Neck ROM: Full    Dental  (+) Dental Advisory Given   Pulmonary sleep apnea (does nor use CPAP)    breath sounds clear to auscultation       Cardiovascular hypertension, Pt. on medications and Pt. on home beta blockers + CAD (coronary calicification on CT)   Rhythm:Regular Rate:Normal  07/2023 Stress: EF 64%, normal LVF, low risk study  07/2023 ECHO: EF 60-65%, normal LVF, normal RVF, mild MR, mild AI   Neuro/Psych  Headaches  Anxiety Depression    glaucoma    GI/Hepatic ,GERD  Medicated and Controlled,,(+) Hepatitis -  Endo/Other  BMI 31  Renal/GU negative Renal ROS   Prostate cancer    Musculoskeletal   Abdominal   Peds  Hematology negative hematology ROS (+)   Anesthesia Other Findings   Reproductive/Obstetrics                              Anesthesia Physical Anesthesia Plan  ASA: 3  Anesthesia Plan: General   Post-op Pain Management: Tylenol  PO (pre-op)*   Induction: Intravenous  PONV Risk Score and Plan: 2 and Ondansetron  and Dexamethasone   Airway Management Planned: Oral ETT  Additional Equipment: None  Intra-op Plan:   Post-operative Plan: Extubation in OR  Informed Consent: I have reviewed the patients History and Physical, chart, labs and discussed the procedure including the risks, benefits and alternatives for the proposed anesthesia with the patient or authorized representative who has indicated his/her understanding and acceptance.     Dental advisory given  Plan Discussed with: CRNA and  Surgeon  Anesthesia Plan Comments: (PAT note by Lynwood Hope, PA-C: 63 year old male with pertinent history including HTN, hepatic steatosis, prediabetes, OSA not on CPAP, GERD on PPI.  Patient recently referred to cardiology for evaluation prior to undergoing radical prostatectomy.  He was seen by Dr. Krasowski and Lexiscan  stress test and echocardiogram were ordered which were both benign and he was cleared for surgery based on these results.  However, at follow-up with Dr. Krasowski on 09/28/2023, patient reported some atypical chest pain.  Coronary CTA was ordered for further evaluation.  Study done 11/08/2023 showed mild nonobstructive CAD.  Recently elevated transaminases after restarting Crestor .  CMP 06/28/2023 showed AST 130 and ALT 137 (up from 43 and 44 on 02/19/2023).  Crestor  was discontinued at that time.  Recheck on 09/28/2023 showed stable elevations with AST 134 and ALT 166.  Patient will need day of surgery labs and evaluation.  EKG 09/28/2023: NSR.  Rate 74.  Coronary CTA 11/08/2023: IMPRESSION: 1. Coronary calcium  score of 538. This was 90 percentile for age and sex matched control.  2. Normal coronary origin with right dominance.  3. CAD-RADS 2. Mild non-obstructive CAD (25-49%) mid LAD. Consider non-atherosclerotic causes of chest pain. Consider preventive therapy and risk factor modification.  Nuclear stress 07/25/2023:   The study is normal. The study is low risk.   No ST deviation was noted.   Left ventricular function is normal. Nuclear stress EF: 64%. The left ventricular ejection fraction is normal (55-65%). End diastolic cavity size is normal.  TTE 07/25/2022:  1. Technically difficult study.  2. Left ventricular ejection fraction, by estimation, is 60 to 65%. The  left ventricle has normal function. The left ventricle has no regional  wall motion abnormalities. Left ventricular diastolic parameters were  normal. The average left ventricular  global longitudinal  strain is -18.3 %. The global longitudinal strain is  normal.  3. Right ventricular systolic function is normal. The right ventricular  size is normal. There is normal pulmonary artery systolic pressure.  4. The mitral valve is grossly normal. Mild mitral valve regurgitation.  No evidence of mitral stenosis.  5. The aortic valve was not well visualized. Aortic valve regurgitation  is mild. No aortic stenosis is present. Aortic valve Vmax measures 1.59  m/s.  6. There is borderline dilatation of the descending aorta, measuring 27  mm.  7. The inferior vena cava is normal in size with greater than 50%  respiratory variability, suggesting right atrial pressure of 3 mmHg.    )         Anesthesia Quick Evaluation

## 2023-11-23 NOTE — Telephone Encounter (Signed)
 Zona with [facility] calling to f/u on clearance. She understands they did not request a medicine hold, but would like a clearance that states he can have procedure. Pts procedure is Monday 10/6. Please advise.

## 2023-11-23 NOTE — H&P (Signed)
 Office Visit Report     11/19/2023   --------------------------------------------------------------------------------   Elsie LOIS Hila  MRN: [id number]  DOB: Jan 05, 1961, 63 year old Male  SSN: [id number]   PRIMARY CARE:  Verneita Kettering, MD  PRIMARY CARE FAX:  [phone]  REFERRING:  Gretel Renda Mickey CHRISTELLA  PROVIDER:  Gretel Renda, M.D.  TREATING:  Ubaldo Eagles, NP  LOCATION:  [facility] [phone]     --------------------------------------------------------------------------------   CC/HPI: CC: Prostate Cancer   Physician requesting consult: Dr. Adina Brooks  PCP: Dr. Verneita Kettering   Mr. Lipford is a 63 year old gentleman who was found to have an elevated PSA of 5.78 that prompted an MRI of the prostate on 05/04/23. This demonstrated a 1.4 cm PI-RADS 4 lesion of the left mid/base peripheral zone and another 1.3 cm PI-RADS 4 lesion of the left mid/apical peripheral zone. An MR/US  fusion biopsy on 05/04/23 confirmed Gleason 3+4=7 adenocarcinoma with 11 out of 12 systematic biopsies and all 6 targeted biopsies positive for malignancy.   Family history: None.   Imaging studies: MRI (03/22/23) - No EPE, SVI, LAD or bone lesions.   PMH: He has a history of glaucoma, hyperlipidemia, hypertension, GERD, and sleep apnea. 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 In addition, he recently has had some chest pain and dyspnea on exertion and has been referred to Dr. Bernie for further evaluation. He does not yet have this appointment scheduled.  PSH: No abdominal surgeries.   TNM stage: cT1c N0 Mx  PSA: 5.78  Gleason score: 3+4=7 (GG 2)  Biopsy (05/04/23): 17/18 cores positive  Left: L lateral apex (90%, 3+4=7), L apex (90%, 3+4=7), L lateral mid (90%, 3+4=7), L mid (90%, 3+4=7, PNI), L lateral base (90%, 3+4=7, PNI), L base (30%, 3+3=6, PNI)  Right: R apex (30%, 3+4=7), R lateral apex (5%, 3+3=6), R mid (5%, 3+4=7), R lateral mid (5%,  3+3=6), R lateral base (5%, 3+3=6)  ROI-1: 3/3 cores positive (3+4=7 in 95%, 95%, and 90%, PNI)  ROI-2: 3/3 cores positive (3+4=7 in 95%, 95%, and 90%, PNI)  Prostate volume: 29.3 cc   Nomogram  OC disease: 28%  EPE: 71%  SVI: 8%  LNI: 15%  PFS (5 year, 10 year): 67%, 51%   Urinary function: IPSS is 34. He has fairly severe lower urinary tract symptoms which initially prompted him to be evaluated by Dr. Brooks a few years ago. He is not currently on any medical therapy.  Erectile function: SHIM score is 13. He estimates that he could get an erection adequate for intercourse approximately 0-3 times out of 10. He has previously tried tadalafil  without much improvement.   11/19/2023: Patient here today for preoperative appointment prior to undergoing robotic assisted laparoscopic radical prostatectomy and bilateral pelvic lymphadenectomy with Dr. Renda on 10/6. He is accompanied by a family member today. Patient has previously seen PT specialist for pelvic floor physical therapy at [facility] [place] in anticipation of his postoperative recovery. He has a previously scheduled postoperative follow-up with those providers as well. I went ahead and updated his medication list today as he is on some new medications. He denies any interval surgical intervention since last visit. He does have baseline frequency/urgency, weak stream at baseline. He denies any recent dysuria, visible blood in the urine or recent treatment for UTI. UA here today within normal limits. Currently denying any chest pain or shortness of breath, he denies any recent fevers  or chills, nausea/vomiting.   Of Note the patient was evaluated by cardiology in August to serve as a screening visit. He saw Dr. Krasowski. He was describing chest pain at that visit so formal clearance was not given. He has since underwent additional evaluation in the form of a CT coronary study. To date, I do not see any additional documentation regarding  his formal clearance for surgery in the chart.     ALLERGIES: Keflex CAPS    MEDICATIONS: Allopurinol  100 MG Tablet 1 tablet PO BID  Metoprolol  Succinate ER 25 MG Tablet Extended Release 24 Hour  Carvedilol  3.125 MG Tablet 1 tablet PO BID  Colchicine  0.6 MG Capsule  Cosopt PF 2-0.5 % Solution  Isosorbide  Dinitrate 30 MG Tablet 1 tablet PO Daily  Losartan  Potassium 100 MG Tablet 1 tablet PO Daily  Pantoprazole  Sodium 40 MG Tablet Delayed Release 1 tablet PO Daily  Sertraline  HCl 50 MG Tablet 1 tablet PO Daily     GU PSH: Complex cystometrogram, w/ void pressure and urethral pressure profile studies, any technique - 2019 Complex Uroflow - 2019 Cystoscopy - 2019 Emg surf Electrd - 2019 Inject For cystogram - 2019 Intrabd voidng Press - 2019 Prostate Needle Biopsy - 05/04/2023       PSH Notes: Eye Surgery, Eye Surgery, Hand Surgery   NON-GU PSH: Hand/finger Surgery Implant Eye Shunt Surgical Pathology, Gross And Microscopic Examination For Prostate Needle - 05/04/2023 Visit Complexity (formerly GPC1X) - 05/14/2023, 02/07/2023     GU PMH: Prostate Cancer - 06/15/2023, I had a long discussion with the patient using his path report as a reference. We discussed his stage, grade and prognosis. We discussed the nature risks and benefits of active surveillance, radical prostatectomy, external beam radiotherapy, and brachytherapy. We discussed the role of androgen deprivation and chemotherapy in prostate cancer. We also discussed other ablative techniques such as HiFU and cryotherapy as well as whole gland versus focal treatment. We discussed specifically how each treatment might affect bowel, bladder and sexual function. We discussed how each treatment might effect salvage treatments and active surveillance might lead to progression and more difficult treatment in the future. All questions answered. Given prior LUTS he is more interested in surgery. He is here with his cousin - a Paediatric nurse. , -  05/14/2023 Elevated PSA, He tolerated the biopsy well and remained in good spirits. He will follow-up as planned. Also will notify of results. - 05/04/2023, We discussed the nature, risks and benefits of PSA screening as well as the nature of elevated PSA (benign versus malignant). We discussed the possibility of prostate cancer and discussed PSA cutoff levels for screening, for age, for PCa risk stratification. We discussed the management of prostate cancer might include active surveillance or treatment depending on patient and cancer characteristics. In that context, we discussed the nature, risks and benefits of continued surveillance, other lab tests, transrectal ultrasound/prostate biopsy, or prostate MRI. All questions answered. PSA was sent. Consider MRI. , - 02/07/2023 BPH w/LUTS, disc tamsulosin . He will consider - 02/07/2023, - 2019, - 2019, - 2019, - 2018, Benign localized prostatic hyperplasia with lower urinary tract symptoms (LUTS), - 2014 ED due to arterial insufficiency, sildenafil  20 mg - take 1-5 as needed. - 02/07/2023, - 2019, - 2019, - 2018, Erectile dysfunction due to arterial insufficiency, - 2014 Peyronies Disease, consider xialfex - 02/07/2023, Peyronie's disease, - 2014 Weak Urinary Stream - 02/07/2023, - 2018      PMH Notes:  2012-11-07 09:52:29 - Note: Arthritis  NON-GU PMH: Gout, Gout - 2014 Personal history of other diseases of the circulatory system, History of hypertension - 2014 Personal history of other diseases of the digestive system, History of esophageal reflux - 2014 Personal history of other diseases of the nervous system and sense organs, History of sleep apnea - 2014, History of glaucoma, - 2014 Personal history of other endocrine, nutritional and metabolic disease, History of hypercholesterolemia - 2014 Encounter for general adult medical examination without abnormal findings, Encounter for preventive health examination    FAMILY HISTORY: Death In The  Family Father - Runs In Family Death In The Family Mother - Runs In Family liver cancer - Mother Lung Cancer - Father   SOCIAL HISTORY: Marital Status: Divorced Preferred Language: English Current Smoking Status: Patient has never smoked.   Tobacco Use Assessment Completed: Used Tobacco in last 30 days? Does drink.  Drinks 2 caffeinated drinks per day. Patient's occupation is/was [employer].     Notes: Never A Smoker, Marital History - Currently Married, Caffeine Use, Alcohol Use   REVIEW OF SYSTEMS:    GU Review Male:   Patient reports frequent urination, hard to postpone urination, get up at night to urinate, stream starts and stops, and trouble starting your stream. Patient denies burning/ pain with urination, leakage of urine, have to strain to urinate , erection problems, and penile pain.  Gastrointestinal (Upper):   Patient denies nausea, vomiting, and indigestion/ heartburn.  Gastrointestinal (Lower):   Patient denies diarrhea and constipation.  Constitutional:   Patient denies fever, night sweats, weight loss, and fatigue.  Skin:   Patient denies skin rash/ lesion and itching.  Eyes:   Patient denies double vision and blurred vision.  Ears/ Nose/ Throat:   Patient denies sore throat and sinus problems.  Hematologic/Lymphatic:   Patient denies swollen glands and easy bruising.  Cardiovascular:   Patient denies leg swelling and chest pains.  Respiratory:   Patient denies cough and shortness of breath.  Endocrine:   Patient denies excessive thirst.  Musculoskeletal:   Patient denies back pain and joint pain.  Neurological:   Patient denies headaches and dizziness.  Psychologic:   Patient denies depression and anxiety.   VITAL SIGNS: None   MULTI-SYSTEM PHYSICAL EXAMINATION:    Constitutional: Well-nourished. No physical deformities. Normally developed. Good grooming.  Neck: Neck symmetrical, not swollen. Normal tracheal position.  Respiratory: No labored breathing, no use of  accessory muscles. Clear bilaterally.  Cardiovascular: Normal temperature, normal extremity pulses, no swelling, no varicosities. Regular rate and rhythm.  Skin: No paleness, no jaundice, no cyanosis. No lesion, no ulcer, no rash.  Neurologic / Psychiatric: Oriented to time, oriented to place, oriented to person. No depression, no anxiety, no agitation.  Gastrointestinal: No mass, no tenderness, no rigidity, mildly obese abdomen.   Musculoskeletal: Normal gait and station of head and neck.     Complexity of Data:  Source Of History:  Patient, Family/Caregiver, Medical Record Summary  Lab Test Review:   PSA  Records Review:   Pathology Reports, Previous Doctor Records, Previous Hospital Records, Previous Patient Records  Urine Test Review:   Urinalysis  X-Ray Review: PET- PSMA Scan: Reviewed Report.     02/07/23  PSA  Total PSA 5.78 ng/mL  Free PSA 1.22 ng/mL  % Free PSA 21 % PSA    11/19/23  Urinalysis  Urine Appearance Clear   Urine Color Yellow   Urine Glucose Neg mg/dL  Urine Bilirubin Neg mg/dL  Urine Ketones Neg mg/dL  Urine Specific  Gravity 1.025   Urine Blood Neg ery/uL  Urine pH 5.5   Urine Protein Neg mg/dL  Urine Urobilinogen 0.2 mg/dL  Urine Nitrites Neg   Urine Leukocyte Esterase Neg leu/uL   PROCEDURES:          Urinalysis Dipstick Dipstick Cont'd  Color: Yellow Bilirubin: Neg mg/dL  Appearance: Clear Ketones: Neg mg/dL  Specific Gravity: 8.974 Blood: Neg ery/uL  pH: 5.5 Protein: Neg mg/dL  Glucose: Neg mg/dL Urobilinogen: 0.2 mg/dL    Nitrites: Neg    Leukocyte Esterase: Neg leu/uL    ASSESSMENT:      ICD-10 Details  1 GU:   Prostate Cancer - C61 Chronic, Threat to Bodily Function  2   Weak Urinary Stream - R39.12 Chronic, Stable  3   BPH w/LUTS - N40.1 Chronic, Stable  4 NON-GU:   Encounter for other preprocedural examination - Z01.818 Undiagnosed New Problem   PLAN:            Medications Stop Meds: Sildenafil  Citrate 20 MG Tablet 1 tablet  PO Daily  Discontinue: 11/19/2023  - Reason: The medication cycle was completed.  traMADol  HCl 50 MG Tablet  Discontinue: 11/19/2023  - Reason: The medication cycle was completed.  levoFLOXacin  750 MG Tablet 1 tablet PO Morning of the Procedure  Start: 04/26/2023  Discontinue: 11/19/2023  - Reason: The medication cycle was completed.  Sildenafil  Citrate 20 MG Tablet take 1 -5 tablets po about 1 hour before sexual activity  Start: 02/07/2023  Discontinue: 11/19/2023  - Reason: The medication cycle was completed.            Orders Labs Urine Culture          Document Letter(s):  Created for Patient: Clinical Summary         Notes:   All questions answered to the best of my ability regarding the upcoming procedure and its expected postoperative course with understanding expressed by the patient and his family member present today. Urine culture sent today to serve as a precautionary baseline.   I sent a message to Dr. Griselda scheduler regarding his cardiac evaluation. The patient was updated regarding this as well.  Pending the results of such for now, he will keep his previously scheduled surgery date for robotic prostatectomy on 10/6.        Next Appointment:      Next Appointment: 11/26/2023 07:15 AM    Appointment Type: Surgery     Location: [facility] [phone]    Provider: Gretel Ferrara, M.D.    Reason for Visit: WL/OBS RALP LEV 2 AND BPLND WITH AMANDA      * Signed by Ubaldo Eagles, NP on 11/19/23 at 5:06 PM (EDT)*

## 2023-11-26 ENCOUNTER — Encounter (HOSPITAL_COMMUNITY): Admission: RE | Disposition: A | Payer: Self-pay | Source: Home / Self Care | Attending: Urology

## 2023-11-26 ENCOUNTER — Ambulatory Visit (HOSPITAL_BASED_OUTPATIENT_CLINIC_OR_DEPARTMENT_OTHER): Payer: Self-pay | Admitting: Anesthesiology

## 2023-11-26 ENCOUNTER — Encounter (HOSPITAL_COMMUNITY): Payer: Self-pay | Admitting: Urology

## 2023-11-26 ENCOUNTER — Other Ambulatory Visit: Payer: Self-pay

## 2023-11-26 ENCOUNTER — Ambulatory Visit (HOSPITAL_COMMUNITY): Payer: Self-pay | Admitting: Physician Assistant

## 2023-11-26 ENCOUNTER — Observation Stay (HOSPITAL_COMMUNITY)
Admission: RE | Admit: 2023-11-26 | Discharge: 2023-11-27 | Disposition: A | Discharge status: Home / Self Care | Attending: Urology | Admitting: Urology

## 2023-11-26 DIAGNOSIS — F418 Other specified anxiety disorders: Secondary | ICD-10-CM | POA: Diagnosis not present

## 2023-11-26 DIAGNOSIS — Z23 Encounter for immunization: Secondary | ICD-10-CM | POA: Diagnosis not present

## 2023-11-26 DIAGNOSIS — I10 Essential (primary) hypertension: Secondary | ICD-10-CM

## 2023-11-26 DIAGNOSIS — Z79899 Other long term (current) drug therapy: Secondary | ICD-10-CM | POA: Diagnosis not present

## 2023-11-26 DIAGNOSIS — I2510 Atherosclerotic heart disease of native coronary artery without angina pectoris: Secondary | ICD-10-CM | POA: Insufficient documentation

## 2023-11-26 DIAGNOSIS — C61 Malignant neoplasm of prostate: Secondary | ICD-10-CM

## 2023-11-26 HISTORY — PX: ROBOT ASSISTED LAPAROSCOPIC RADICAL PROSTATECTOMY: SHX5141

## 2023-11-26 LAB — CBC
HCT: 43 % (ref 39.0–52.0)
Hemoglobin: 14 g/dL (ref 13.0–17.0)
MCH: 31.5 pg (ref 26.0–34.0)
MCHC: 32.6 g/dL (ref 30.0–36.0)
MCV: 96.8 fL (ref 80.0–100.0)
Platelets: 171 K/uL (ref 150–400)
RBC: 4.44 MIL/uL (ref 4.22–5.81)
RDW: 13.2 % (ref 11.5–15.5)
WBC: 6.7 K/uL (ref 4.0–10.5)
nRBC: 0 % (ref 0.0–0.2)

## 2023-11-26 LAB — TYPE AND SCREEN
ABO/RH(D): O POS
Antibody Screen: NEGATIVE

## 2023-11-26 LAB — BASIC METABOLIC PANEL WITH GFR
Anion gap: 9 (ref 5–15)
BUN: 13 mg/dL (ref 8–23)
CO2: 23 mmol/L (ref 22–32)
Calcium: 8.9 mg/dL (ref 8.9–10.3)
Chloride: 101 mmol/L (ref 98–111)
Creatinine, Ser: 0.83 mg/dL (ref 0.61–1.24)
GFR, Estimated: >60 mL/min (ref >60)
Glucose, Bld: 111 mg/dL — ABNORMAL HIGH (ref 70–99)
Potassium: 4 mmol/L (ref 3.5–5.1)
Sodium: 133 mmol/L — ABNORMAL LOW (ref 135–145)

## 2023-11-26 LAB — HEMOGLOBIN AND HEMATOCRIT, BLOOD
HCT: 42.3 % (ref 39.0–52.0)
Hemoglobin: 13.2 g/dL (ref 13.0–17.0)

## 2023-11-26 LAB — ABO/RH: ABO/RH(D): O POS

## 2023-11-26 SURGERY — PROSTATECTOMY, RADICAL, ROBOT-ASSISTED, LAPAROSCOPIC
Anesthesia: General

## 2023-11-26 MED ORDER — CHLORHEXIDINE GLUCONATE 0.12 % MT SOLN
15.0000 mL | Freq: Once | OROMUCOSAL | Status: AC
  Administered 2023-11-26: 15 mL via OROMUCOSAL

## 2023-11-26 MED ORDER — SODIUM CHLORIDE 0.9 % IV BOLUS
1000.0000 mL | Freq: Once | INTRAVENOUS | Status: AC
  Administered 2023-11-26: 1000 mL via INTRAVENOUS

## 2023-11-26 MED ORDER — SERTRALINE HCL 50 MG PO TABS
50.0000 mg | ORAL_TABLET | Freq: Every day | ORAL | Status: DC
  Administered 2023-11-27: 50 mg via ORAL
  Filled 2023-11-26: qty 1

## 2023-11-26 MED ORDER — ACETAMINOPHEN 325 MG PO TABS
650.0000 mg | ORAL_TABLET | ORAL | Status: DC | PRN
  Administered 2023-11-26: 650 mg via ORAL
  Filled 2023-11-26: qty 2

## 2023-11-26 MED ORDER — DIPHENHYDRAMINE HCL 50 MG/ML IJ SOLN: 12.5000 mg | Freq: Four times a day (QID) | INTRAMUSCULAR | Status: DC | PRN

## 2023-11-26 MED ORDER — OXYCODONE HCL 5 MG/5ML PO SOLN: 5.0000 mg | Freq: Once | ORAL | Status: DC | PRN

## 2023-11-26 MED ORDER — SUGAMMADEX SODIUM 200 MG/2ML IV SOLN
INTRAVENOUS | Status: DC | PRN
  Administered 2023-11-26: 200 mg via INTRAVENOUS

## 2023-11-26 MED ORDER — HYDROMORPHONE HCL 2 MG/ML IJ SOLN
INTRAMUSCULAR | Status: AC
  Filled 2023-11-26: qty 1

## 2023-11-26 MED ORDER — PHENYLEPHRINE HCL-NACL 20-0.9 MG/250ML-% IV SOLN
INTRAVENOUS | Status: DC | PRN
  Administered 2023-11-26: 15 ug/min via INTRAVENOUS

## 2023-11-26 MED ORDER — PNEUMOCOCCAL 20-VAL CONJ VACC 0.5 ML IM SUSY
0.5000 mL | PREFILLED_SYRINGE | INTRAMUSCULAR | Status: AC
  Administered 2023-11-27: 0.5 mL via INTRAMUSCULAR
  Filled 2023-11-26: qty 0.5

## 2023-11-26 MED ORDER — ONDANSETRON HCL 4 MG/2ML IJ SOLN
INTRAMUSCULAR | Status: AC
  Filled 2023-11-26: qty 2

## 2023-11-26 MED ORDER — VANCOMYCIN HCL IN DEXTROSE 1-5 GM/200ML-% IV SOLN
1000.0000 mg | Freq: Two times a day (BID) | INTRAVENOUS | Status: AC
  Administered 2023-11-26: 1000 mg via INTRAVENOUS
  Filled 2023-11-26: qty 200

## 2023-11-26 MED ORDER — MIDAZOLAM HCL 5 MG/5ML IJ SOLN
INTRAMUSCULAR | Status: DC | PRN
  Administered 2023-11-26: 2 mg via INTRAVENOUS

## 2023-11-26 MED ORDER — ACETAMINOPHEN 500 MG PO TABS
1000.0000 mg | ORAL_TABLET | Freq: Once | ORAL | Status: AC
  Administered 2023-11-26: 1000 mg via ORAL
  Filled 2023-11-26: qty 2

## 2023-11-26 MED ORDER — HEPARIN SODIUM (PORCINE) 1000 UNIT/ML IJ SOLN
INTRAMUSCULAR | Status: AC
  Filled 2023-11-26: qty 1

## 2023-11-26 MED ORDER — OXYCODONE HCL 5 MG PO TABS: 5.0000 mg | ORAL_TABLET | Freq: Once | ORAL | Status: DC | PRN

## 2023-11-26 MED ORDER — ORAL CARE MOUTH RINSE: 15.0000 mL | Freq: Once | OROMUCOSAL | Status: AC

## 2023-11-26 MED ORDER — DORZOLAMIDE HCL-TIMOLOL MAL 2-0.5 % OP SOLN
1.0000 [drp] | Freq: Two times a day (BID) | OPHTHALMIC | Status: DC
  Administered 2023-11-27: 1 [drp] via OPHTHALMIC
  Filled 2023-11-26 (×2): qty 10

## 2023-11-26 MED ORDER — KCL IN DEXTROSE-NACL 20-5-0.45 MEQ/L-%-% IV SOLN
INTRAVENOUS | Status: AC
  Filled 2023-11-26: qty 1000

## 2023-11-26 MED ORDER — SULFAMETHOXAZOLE-TRIMETHOPRIM 800-160 MG PO TABS: 1.0000 | ORAL_TABLET | Freq: Two times a day (BID) | ORAL | 0 refills | Status: AC

## 2023-11-26 MED ORDER — KCL IN DEXTROSE-NACL 20-5-0.45 MEQ/L-%-% IV SOLN
INTRAVENOUS | Status: DC
  Administered 2023-11-26: 150 mL/h via INTRAVENOUS
  Filled 2023-11-26 (×2): qty 1000

## 2023-11-26 MED ORDER — FENTANYL CITRATE (PF) 100 MCG/2ML IJ SOLN
INTRAMUSCULAR | Status: AC
  Filled 2023-11-26: qty 2

## 2023-11-26 MED ORDER — MEPERIDINE HCL 100 MG/ML IJ SOLN: 6.2500 mg | INTRAMUSCULAR | Status: DC | PRN

## 2023-11-26 MED ORDER — KETOROLAC TROMETHAMINE 15 MG/ML IJ SOLN
15.0000 mg | Freq: Four times a day (QID) | INTRAMUSCULAR | Status: DC
  Administered 2023-11-26 – 2023-11-27 (×4): 15 mg via INTRAVENOUS
  Filled 2023-11-26 (×4): qty 1

## 2023-11-26 MED ORDER — TRIPLE ANTIBIOTIC 3.5-400-5000 EX OINT: 1.0000 | TOPICAL_OINTMENT | Freq: Three times a day (TID) | CUTANEOUS | Status: DC | PRN

## 2023-11-26 MED ORDER — EPHEDRINE SULFATE-NACL 50-0.9 MG/10ML-% IV SOSY
PREFILLED_SYRINGE | INTRAVENOUS | Status: DC | PRN
  Administered 2023-11-26 (×3): 5 mg via INTRAVENOUS

## 2023-11-26 MED ORDER — HYOSCYAMINE SULFATE 0.125 MG SL SUBL
0.1250 mg | SUBLINGUAL_TABLET | Freq: Four times a day (QID) | SUBLINGUAL | Status: DC | PRN
  Administered 2023-11-27: 0.125 mg via SUBLINGUAL
  Filled 2023-11-26: qty 1

## 2023-11-26 MED ORDER — BUPIVACAINE-EPINEPHRINE (PF) 0.25% -1:200000 IJ SOLN
INTRAMUSCULAR | Status: AC
Start: 2023-11-26 — End: 2023-11-26
  Filled 2023-11-26: qty 30

## 2023-11-26 MED ORDER — MIDAZOLAM HCL 2 MG/2ML IJ SOLN
INTRAMUSCULAR | Status: AC
  Filled 2023-11-26: qty 2

## 2023-11-26 MED ORDER — HEMOSTATIC AGENTS (NO CHARGE) OPTIME
TOPICAL | Status: DC | PRN
  Administered 2023-11-26: 1 via TOPICAL

## 2023-11-26 MED ORDER — PANTOPRAZOLE SODIUM 40 MG PO TBEC
40.0000 mg | DELAYED_RELEASE_TABLET | Freq: Two times a day (BID) | ORAL | Status: DC
  Administered 2023-11-26 – 2023-11-27 (×2): 40 mg via ORAL
  Filled 2023-11-26 (×2): qty 1

## 2023-11-26 MED ORDER — FLEET ENEMA RE ENEM
1.0000 | ENEMA | Freq: Once | RECTAL | Status: DC
  Filled 2023-11-26: qty 1

## 2023-11-26 MED ORDER — PROPOFOL 10 MG/ML IV BOLUS
INTRAVENOUS | Status: DC | PRN
Start: 2023-11-26 — End: 2023-11-26
  Administered 2023-11-26: 130 mg via INTRAVENOUS

## 2023-11-26 MED ORDER — MORPHINE SULFATE (PF) 2 MG/ML IV SOLN
2.0000 mg | INTRAVENOUS | Status: DC | PRN
  Administered 2023-11-26 – 2023-11-27 (×2): 2 mg via INTRAVENOUS
  Filled 2023-11-26 (×2): qty 1

## 2023-11-26 MED ORDER — ONDANSETRON HCL 4 MG/2ML IJ SOLN
INTRAMUSCULAR | Status: DC | PRN
  Administered 2023-11-26: 4 mg via INTRAVENOUS

## 2023-11-26 MED ORDER — STERILE WATER FOR IRRIGATION IR SOLN
Status: DC | PRN
  Administered 2023-11-26: 1000 mL

## 2023-11-26 MED ORDER — BUPIVACAINE-EPINEPHRINE 0.25% -1:200000 IJ SOLN
INTRAMUSCULAR | Status: DC | PRN
  Administered 2023-11-26: 30 mL

## 2023-11-26 MED ORDER — DOCUSATE SODIUM 100 MG PO CAPS
100.0000 mg | ORAL_CAPSULE | Freq: Two times a day (BID) | ORAL | Status: DC
  Administered 2023-11-26 – 2023-11-27 (×2): 100 mg via ORAL
  Filled 2023-11-26 (×2): qty 1

## 2023-11-26 MED ORDER — HYDROMORPHONE HCL 1 MG/ML IJ SOLN
0.2500 mg | INTRAMUSCULAR | Status: DC | PRN
  Administered 2023-11-26 (×2): 0.5 mg via INTRAVENOUS
  Administered 2023-11-26: .2 mg via INTRAVENOUS
  Administered 2023-11-26 (×2): 0.5 mg via INTRAVENOUS
  Administered 2023-11-26: .2 mg via INTRAVENOUS

## 2023-11-26 MED ORDER — PROPOFOL 10 MG/ML IV BOLUS
INTRAVENOUS | Status: AC
Start: 2023-11-26 — End: 2023-11-26
  Filled 2023-11-26: qty 20

## 2023-11-26 MED ORDER — INFLUENZA VIRUS VACC SPLIT PF (FLUZONE) 0.5 ML IM SUSY
0.5000 mL | PREFILLED_SYRINGE | INTRAMUSCULAR | Status: AC
  Administered 2023-11-27: 0.5 mL via INTRAMUSCULAR
  Filled 2023-11-26: qty 0.5

## 2023-11-26 MED ORDER — MAGNESIUM CITRATE PO SOLN
1.0000 | Freq: Once | ORAL | Status: DC
  Filled 2023-11-26: qty 296

## 2023-11-26 MED ORDER — ONDANSETRON HCL 4 MG/2ML IJ SOLN: 4.0000 mg | INTRAMUSCULAR | Status: DC | PRN

## 2023-11-26 MED ORDER — SODIUM CHLORIDE (PF) 0.9 % IJ SOLN
INTRAMUSCULAR | Status: AC
Start: 2023-11-26 — End: 2023-11-26
  Filled 2023-11-26: qty 20

## 2023-11-26 MED ORDER — VANCOMYCIN HCL 1500 MG/300ML IV SOLN
1500.0000 mg | Freq: Once | INTRAVENOUS | Status: AC
  Administered 2023-11-26: 1500 mg via INTRAVENOUS
  Filled 2023-11-26: qty 300

## 2023-11-26 MED ORDER — DEXAMETHASONE SODIUM PHOSPHATE 10 MG/ML IJ SOLN
INTRAMUSCULAR | Status: AC
Start: 2023-11-26 — End: 2023-11-26
  Filled 2023-11-26: qty 1

## 2023-11-26 MED ORDER — DOCUSATE SODIUM 100 MG PO CAPS: 100.0000 mg | ORAL_CAPSULE | Freq: Two times a day (BID) | ORAL | Status: AC

## 2023-11-26 MED ORDER — LACTATED RINGERS IV SOLN: INTRAVENOUS | Status: DC

## 2023-11-26 MED ORDER — LIDOCAINE HCL (PF) 2 % IJ SOLN
INTRAMUSCULAR | Status: DC | PRN
Start: 2023-11-26 — End: 2023-11-26
  Administered 2023-11-26: 20 mg via INTRADERMAL

## 2023-11-26 MED ORDER — LOSARTAN POTASSIUM 50 MG PO TABS
100.0000 mg | ORAL_TABLET | Freq: Every day | ORAL | Status: DC
  Administered 2023-11-27: 100 mg via ORAL
  Filled 2023-11-26: qty 2

## 2023-11-26 MED ORDER — TRAMADOL HCL 50 MG PO TABS: 50.0000 mg | ORAL_TABLET | Freq: Four times a day (QID) | ORAL | 0 refills | Status: DC | PRN

## 2023-11-26 MED ORDER — MIDAZOLAM HCL 2 MG/2ML IJ SOLN: 0.5000 mg | Freq: Once | INTRAMUSCULAR | Status: DC | PRN

## 2023-11-26 MED ORDER — FENTANYL CITRATE (PF) 100 MCG/2ML IJ SOLN
INTRAMUSCULAR | Status: DC | PRN
  Administered 2023-11-26: 25 ug via INTRAVENOUS
  Administered 2023-11-26 (×2): 50 ug via INTRAVENOUS
  Administered 2023-11-26: 25 ug via INTRAVENOUS
  Administered 2023-11-26: 100 ug via INTRAVENOUS
  Administered 2023-11-26: 50 ug via INTRAVENOUS

## 2023-11-26 MED ORDER — ALLOPURINOL 100 MG PO TABS
100.0000 mg | ORAL_TABLET | Freq: Two times a day (BID) | ORAL | Status: DC
  Administered 2023-11-26 – 2023-11-27 (×2): 100 mg via ORAL
  Filled 2023-11-26 (×2): qty 1

## 2023-11-26 MED ORDER — ALBUMIN HUMAN 5 % IV SOLN: INTRAVENOUS | Status: DC | PRN

## 2023-11-26 MED ORDER — HYDROMORPHONE HCL 1 MG/ML IJ SOLN
INTRAMUSCULAR | Status: AC
  Filled 2023-11-26: qty 2

## 2023-11-26 MED ORDER — DEXAMETHASONE SODIUM PHOSPHATE 10 MG/ML IJ SOLN
INTRAMUSCULAR | Status: DC | PRN
  Administered 2023-11-26: 8 mg via INTRAVENOUS

## 2023-11-26 MED ORDER — ALBUMIN HUMAN 5 % IV SOLN
INTRAVENOUS | Status: AC
  Filled 2023-11-26: qty 250

## 2023-11-26 MED ORDER — CARVEDILOL 3.125 MG PO TABS
3.1250 mg | ORAL_TABLET | Freq: Two times a day (BID) | ORAL | Status: DC
  Administered 2023-11-26 – 2023-11-27 (×2): 3.125 mg via ORAL
  Filled 2023-11-26 (×2): qty 1

## 2023-11-26 MED ORDER — LIDOCAINE HCL (PF) 2 % IJ SOLN
INTRAMUSCULAR | Status: AC
  Filled 2023-11-26: qty 5

## 2023-11-26 MED ORDER — SUGAMMADEX SODIUM 200 MG/2ML IV SOLN
INTRAVENOUS | Status: AC
Start: 2023-11-26 — End: 2023-11-26
  Filled 2023-11-26: qty 2

## 2023-11-26 MED ORDER — ZOLPIDEM TARTRATE 5 MG PO TABS: 5.0000 mg | ORAL_TABLET | Freq: Every evening | ORAL | Status: DC | PRN

## 2023-11-26 MED ORDER — ROCURONIUM BROMIDE 10 MG/ML (PF) SYRINGE
PREFILLED_SYRINGE | INTRAVENOUS | Status: DC | PRN
  Administered 2023-11-26: 60 mg via INTRAVENOUS
  Administered 2023-11-26 (×2): 20 mg via INTRAVENOUS

## 2023-11-26 MED ORDER — ROCURONIUM BROMIDE 10 MG/ML (PF) SYRINGE
PREFILLED_SYRINGE | INTRAVENOUS | Status: AC
  Filled 2023-11-26: qty 10

## 2023-11-26 MED ORDER — DIPHENHYDRAMINE HCL 12.5 MG/5ML PO ELIX
12.5000 mg | ORAL_SOLUTION | Freq: Four times a day (QID) | ORAL | Status: DC | PRN
  Filled 2023-11-26: qty 10

## 2023-11-26 MED ORDER — ROSUVASTATIN CALCIUM 20 MG PO TABS
20.0000 mg | ORAL_TABLET | Freq: Every day | ORAL | Status: DC
  Administered 2023-11-27: 20 mg via ORAL
  Filled 2023-11-26: qty 1

## 2023-11-26 SURGICAL SUPPLY — 55 items
APPLICATOR COTTON TIP 6 STRL (MISCELLANEOUS) ×2 IMPLANT
BAG COUNTER SPONGE SURGICOUNT (BAG) IMPLANT
CATH FOLEY 2WAY SLVR 18FR 30CC (CATHETERS) ×2 IMPLANT
CATH ROBINSON RED A/P 16FR (CATHETERS) ×2 IMPLANT
CATH ROBINSON RED A/P 8FR (CATHETERS) ×2 IMPLANT
CATH TIEMANN FOLEY 18FR 5CC (CATHETERS) ×2 IMPLANT
CHLORAPREP W/TINT 26 (MISCELLANEOUS) ×2 IMPLANT
CLIP LIGATING HEM O LOK PURPLE (MISCELLANEOUS) ×2 IMPLANT
COVER SURGICAL LIGHT HANDLE (MISCELLANEOUS) ×2 IMPLANT
COVER TIP SHEARS 8 DVNC (MISCELLANEOUS) ×2 IMPLANT
CUTTER ECHEON FLEX ENDO 45 340 (ENDOMECHANICALS) ×2 IMPLANT
DERMABOND ADVANCED .7 DNX12 (GAUZE/BANDAGES/DRESSINGS) ×2 IMPLANT
DRAPE ARM DVNC X/XI (DISPOSABLE) ×8 IMPLANT
DRAPE COLUMN DVNC XI (DISPOSABLE) ×2 IMPLANT
DRAPE SURG IRRIG POUCH 19X23 (DRAPES) ×2 IMPLANT
DRIVER NDL LRG 8 DVNC XI (INSTRUMENTS) ×4 IMPLANT
DRIVER NDLE LRG 8 DVNC XI (INSTRUMENTS) ×4 IMPLANT
DRSG TEGADERM 4X4.75 (GAUZE/BANDAGES/DRESSINGS) ×2 IMPLANT
ELECT PENCIL ROCKER SW 15FT (MISCELLANEOUS) ×2 IMPLANT
ELECT REM PT RETURN 15FT ADLT (MISCELLANEOUS) ×2 IMPLANT
FORCEPS BPLR LNG DVNC XI (INSTRUMENTS) ×2 IMPLANT
FORCEPS PROGRASP DVNC XI (FORCEP) ×2 IMPLANT
GAUZE SPONGE 4X4 12PLY STRL (GAUZE/BANDAGES/DRESSINGS) ×2 IMPLANT
GLOVE BIO SURGEON STRL SZ 6.5 (GLOVE) ×2 IMPLANT
GLOVE SURG LX STRL 7.5 STRW (GLOVE) ×4 IMPLANT
GOWN STRL REUS W/ TWL XL LVL3 (GOWN DISPOSABLE) ×4 IMPLANT
GOWN STRL SURGICAL XL XLNG (GOWN DISPOSABLE) ×2 IMPLANT
HEMOSTAT SURGICEL 2X3 (HEMOSTASIS) IMPLANT
HOLDER FOLEY CATH W/STRAP (MISCELLANEOUS) ×2 IMPLANT
IRRIGATION SUCT STRKRFLW 2 WTP (MISCELLANEOUS) ×2 IMPLANT
IV LACTATED RINGERS 1000ML (IV SOLUTION) ×2 IMPLANT
KIT TURNOVER KIT A (KITS) ×2 IMPLANT
NDL SAFETY ECLIPSE 18X1.5 (NEEDLE) IMPLANT
PACK ROBOT UROLOGY CUSTOM (CUSTOM PROCEDURE TRAY) ×2 IMPLANT
PLUG CATH AND CAP STRL 200 (CATHETERS) ×2 IMPLANT
RELOAD STAPLE 45 4.1 GRN THCK (STAPLE) ×2 IMPLANT
SCISSORS LAP 5X45 EPIX DISP (ENDOMECHANICALS) IMPLANT
SCISSORS MNPLR CVD DVNC XI (INSTRUMENTS) ×2 IMPLANT
SEAL UNIV 5-12 XI (MISCELLANEOUS) ×8 IMPLANT
SET TUBE SMOKE EVAC HIGH FLOW (TUBING) ×2 IMPLANT
SOL PREP POV-IOD 4OZ 10% (MISCELLANEOUS) ×2 IMPLANT
SOLUTION ELECTROSURG ANTI STCK (MISCELLANEOUS) ×2 IMPLANT
SPIKE FLUID TRANSFER (MISCELLANEOUS) ×2 IMPLANT
SUT ETHILON 3 0 PS 1 (SUTURE) ×2 IMPLANT
SUT MNCRL 3 0 RB1 (SUTURE) ×2 IMPLANT
SUT MNCRL 3 0 VIOLET RB1 (SUTURE) ×2 IMPLANT
SUT MNCRL AB 4-0 PS2 18 (SUTURE) ×4 IMPLANT
SUT PDS PLUS AB 0 CT-2 (SUTURE) ×4 IMPLANT
SUT VIC AB 0 CT1 27XBRD ANTBC (SUTURE) ×4 IMPLANT
SUT VIC AB 2-0 SH 27X BRD (SUTURE) ×2 IMPLANT
SUT VIC AB 3-0 SH 27X BRD (SUTURE) IMPLANT
SUT VIC AB 3-0 SH 27XBRD (SUTURE) IMPLANT
SYR 27GX1/2 1ML LL SAFETY (SYRINGE) ×2 IMPLANT
TROCAR Z THREAD OPTICAL 12X100 (TROCAR) IMPLANT
WATER STERILE IRR 1000ML POUR (IV SOLUTION) ×2 IMPLANT

## 2023-11-26 NOTE — Discharge Instructions (Signed)

## 2023-11-26 NOTE — Transfer of Care (Signed)
 Immediate Anesthesia Transfer of Care Note  Patient: Jeff [name]  Procedure(s) Performed: PROSTATECTOMY, RADICAL, ROBOT-ASSISTED, LAPAROSCOPIC LYMPHADENECTOMY, PELVIS, ROBOT-ASSISTED (Bilateral)  Patient Location: PACU  Anesthesia Type:General  Level of Consciousness: awake, alert , oriented, and patient cooperative  Airway & Oxygen Therapy: Patient Spontanous Breathing and Patient connected to face mask oxygen  Post-op Assessment: Report given to RN, Post -op Vital signs reviewed and stable, and Patient moving all extremities  Post vital signs: Reviewed and stable  Last Vitals:  Vitals Value Taken Time  BP 159/95 11/26/23 11:18  Temp    Pulse 83 11/26/23 11:20  Resp 13 11/26/23 11:20  SpO2 100 % 11/26/23 11:20  Vitals shown include unfiled device data.  Last Pain:  Vitals:   11/26/23 0633  TempSrc:   PainSc: 0-No pain      Patients Stated Pain Goal: 5 (11/26/23 9371)  Complications: No notable events documented.

## 2023-11-26 NOTE — Plan of Care (Incomplete)
  Problem: Bowel/Gastric: Goal: Gastrointestinal status for postoperative course will improve Outcome: Progressing   Problem: Pain Management: Goal: General experience of comfort will improve Outcome: Progressing   Problem: Skin Integrity: Goal: Demonstration of wound healing without infection will improve Outcome: Progressing   Problem: Urinary Elimination: Goal: Ability to avoid or minimize complications of infection will improve Outcome: Progressing Goal: Ability to achieve and maintain urine output will improve Outcome: Progressing Goal: Home care management will improve Outcome: Progressing   Problem: Health Behavior/Discharge Planning: Goal: Ability to manage health-related needs will improve Outcome: Progressing   Problem: Education: Goal: Knowledge of the procedure and recovery process will improve Outcome: Adequate for Discharge   Problem: Clinical Measurements: Goal: Respiratory complications will improve Outcome: Adequate for Discharge Goal: Cardiovascular complication will be avoided Outcome: Adequate for Discharge   Problem: Activity: Goal: Risk for activity intolerance will decrease Outcome: Adequate for Discharge   Problem: Nutrition: Goal: Adequate nutrition will be maintained Outcome: Adequate for Discharge   Problem: Coping: Goal: Level of anxiety will decrease Outcome: Adequate for Discharge

## 2023-11-26 NOTE — Anesthesia Procedure Notes (Signed)
 Procedure Name: Intubation Date/Time: 11/26/2023 7:28 AM  Performed by: Memory Armida LABOR, CRNAPre-anesthesia Checklist: Patient identified, Emergency Drugs available, Suction available, Patient being monitored and Timeout performed Patient Re-evaluated:Patient Re-evaluated prior to induction Oxygen Delivery Method: Circle system utilized Preoxygenation: Pre-oxygenation with 100% oxygen Induction Type: IV induction Ventilation: Mask ventilation without difficulty Laryngoscope Size: Mac and 4 Grade View: Grade I Tube type: Oral Tube size: 7.5 mm Number of attempts: 1 Airway Equipment and Method: Stylet Placement Confirmation: ETT inserted through vocal cords under direct vision, breath sounds checked- equal and bilateral and positive ETCO2 Secured at: 22 cm Tube secured with: Tape Dental Injury: Teeth and Oropharynx as per pre-operative assessment

## 2023-11-26 NOTE — Op Note (Signed)
 Preoperative diagnosis: Clinically localized adenocarcinoma of the prostate (clinical stage T1c N0 M0)  Postoperative diagnosis: Clinically localized adenocarcinoma of the prostate (clinical stage T1c n0 M0)  Procedure:  Robotic assisted laparoscopic radical prostatectomy (right nerve sparing) Bilateral robotic assisted laparoscopic pelvic lymphadenectomy  Surgeon: Gretel CANDIE Renda Mickey. M.D.  Assistant(s): Alan Hammonds, PA-C  An assistant was required for this surgical procedure.  The duties of the assistant included but were not limited to suctioning, passing suture, camera manipulation, retraction. This procedure would not be able to be performed without an Geophysicist/field seismologist.   Resident: Dr. Lyle Civil  Anesthesia: General  Complications: None  EBL: 150 mL  IVF:  1000 mL crystalloid, 250 mL colloid  Specimens: Prostate and seminal vesicles Right pelvic lymph nodes Left pelvic lymph nodes  Disposition of specimens: Pathology  Drains: 20 Fr coude catheter # 19 Blake pelvic drain  Indication: Jeff [name] is a 63 y.o. patient with clinically localized prostate cancer.  After a thorough review of the management options for treatment of prostate cancer, he elected to proceed with surgical therapy and the above procedure(s).  We have discussed the potential benefits and risks of the procedure, side effects of the proposed treatment, the likelihood of the patient achieving the goals of the procedure, and any potential problems that might occur during the procedure or recuperation. Informed consent has been obtained.  Description of procedure:  The patient was taken to the operating room and a general anesthetic was administered. He was given preoperative antibiotics, placed in the dorsal lithotomy position, and prepped and draped in the usual sterile fashion. Next a preoperative timeout was performed. A urethral catheter was placed into the bladder and a site was selected near  the umbilicus for placement of the camera port. This was placed using a standard open Hassan technique which allowed entry into the peritoneal cavity under direct vision and without difficulty. An 8 mm port was placed and a pneumoperitoneum established. The camera was then used to inspect the abdomen and there was no evidence of any intra-abdominal injuries or other abnormalities. The remaining abdominal ports were then placed. 8 mm robotic ports were placed in the right lower quadrant, left lower quadrant, and far left lateral abdominal wall. A 5 mm port was placed in the right upper quadrant and a 12 mm port was placed in the right lateral abdominal wall for laparoscopic assistance. All ports were placed under direct vision without difficulty. The surgical cart was then docked.   Utilizing the cautery scissors, the bladder was reflected posteriorly allowing entry into the space of Retzius and identification of the endopelvic fascia and prostate. The periprostatic fat was then removed from the prostate allowing full exposure of the endopelvic fascia. The endopelvic fascia was then incised from the apex back to the base of the prostate bilaterally and the underlying levator muscle fibers were swept laterally off the prostate thereby isolating the dorsal venous complex. The dorsal vein was then stapled and divided with a 45 mm Flex Echelon stapler. Attention then turned to the bladder neck which was divided anteriorly thereby allowing entry into the bladder and exposure of the urethral catheter. The catheter balloon was deflated and the catheter was brought into the operative field and used to retract the prostate anteriorly. The posterior bladder neck was then examined and was divided allowing further dissection between the bladder and prostate posteriorly until the vasa deferentia and seminal vessels were identified. The vasa deferentia were isolated, divided, and lifted anteriorly. The  seminal vesicles were  dissected down to their tips with care to control the seminal vascular arterial blood supply. These structures were then lifted anteriorly and the space between Denonvillier's fascia and the anterior rectum was developed with a combination of sharp and blunt dissection. This isolated the vascular pedicles of the prostate.  The lateral prostatic fascia on the right side of the prostate was then sharply incised allowing release of the neurovascular bundle. The vascular pedicle of the prostate on the right side was then ligated with Weck clips between the prostate and neurovascular bundle and divided with sharp cold scissor dissection resulting in neurovascular bundle preservation. On the left side, a wide non nerve sparing dissection was performed with Weck clips used to ligate the vascular pedicle of the prostate. The neurovascular bundle on the right side was then separated off the apex of the prostate and urethra.  The urethra was then sharply transected allowing the prostate specimen to be disarticulated. The pelvis was copiously irrigated and hemostasis was ensured. There was no evidence for rectal injury.  Attention then turned to the right pelvic sidewall. The fibrofatty tissue between the external iliac vein, confluence of the iliac vessels, hypogastric artery, and Cooper's ligament was dissected free from the pelvic sidewall with care to preserve the obturator nerve. Weck clips were used for lymphostasis and hemostasis. An identical procedure was performed on the contralateral side and the lymphatic packets were removed for permanent pathologic analysis.  Attention then turned to the urethral anastomosis. A 2-0 Vicryl slip knot was placed between Denonvillier's fascia, the posterior bladder neck, and the posterior urethra to reapproximate these structures. A double-armed 3-0 Monocryl suture was then used to perform a 360 running tension-free anastomosis between the bladder neck and urethra. A new  urethral catheter was then placed into the bladder and irrigated. There were no blood clots within the bladder and the anastomosis appeared to be watertight. A #19 Blake drain was then brought through the left lateral 8 mm port site and positioned appropriately within the pelvis. It was secured to the skin with a nylon suture. The surgical cart was then undocked. The right lateral 12 mm port site was closed at the fascial level with a 0 Vicryl suture placed laparoscopically. All remaining ports were then removed under direct vision. The prostate specimen was removed intact within the Endopouch retrieval bag via the periumbilical camera port site. This fascial opening was closed with two running 0 Vicryl sutures. 0.25% Marcaine  was then injected into all port sites and all incisions were reapproximated at the skin level with 4-0 Monocryl subcuticular sutures. Dermabond was applied. The patient appeared to tolerate the procedure well and without complications. The patient was able to be extubated and transferred to the recovery unit in satisfactory condition.   Gretel CANDIE Renda Teddie MD

## 2023-11-26 NOTE — Anesthesia Postprocedure Evaluation (Signed)
 Anesthesia Post Note  Patient: Elnathan Fulford Ikard  Procedure(s) Performed: PROSTATECTOMY, RADICAL, ROBOT-ASSISTED, LAPAROSCOPIC LYMPHADENECTOMY, PELVIS, ROBOT-ASSISTED (Bilateral)     Patient location during evaluation: PACU Anesthesia Type: General Level of consciousness: awake and alert, oriented and patient cooperative Pain control: pain improving. Vital Signs Assessment: post-procedure vital signs reviewed and stable Respiratory status: spontaneous breathing, nonlabored ventilation, respiratory function stable and patient connected to nasal cannula oxygen Cardiovascular status: blood pressure returned to baseline and stable Postop Assessment: no apparent nausea or vomiting Anesthetic complications: no   No notable events documented.  Last Vitals:  Vitals:   11/26/23 1230 11/26/23 1245  BP: (!) 157/114 (!) 158/108  Pulse: 84 84  Resp: 12 11  Temp:  36.6 C  SpO2: 96% 96%    Last Pain:  Vitals:   11/26/23 1245  TempSrc:   PainSc: Asleep                 Ondra Deboard,E. Royston Bekele

## 2023-11-26 NOTE — Interval H&P Note (Signed)
 History and Physical Interval Note:  11/26/2023 7:02 AM  Jeff [name]  has presented today for surgery, with the diagnosis of PROSTATE CANCER.  The various methods of treatment have been discussed with the patient and family. After consideration of risks, benefits and other options for treatment, the patient has consented to  Procedure(s) with comments: PROSTATECTOMY, RADICAL, ROBOT-ASSISTED, LAPAROSCOPIC (N/A) - LEVEL 2 LYMPHADENECTOMY, PELVIS, ROBOT-ASSISTED (Bilateral) as a surgical intervention.  The patient's history has been reviewed, patient examined, no change in status, stable for surgery.  I have reviewed the patient's chart and labs.  Questions were answered to the patient's satisfaction.     [employer]

## 2023-11-27 ENCOUNTER — Encounter (HOSPITAL_COMMUNITY): Payer: Self-pay | Admitting: Urology

## 2023-11-27 DIAGNOSIS — C61 Malignant neoplasm of prostate: Secondary | ICD-10-CM | POA: Diagnosis not present

## 2023-11-27 LAB — HEMOGLOBIN AND HEMATOCRIT, BLOOD
HCT: 39.9 % (ref 39.0–52.0)
Hemoglobin: 12.6 g/dL — ABNORMAL LOW (ref 13.0–17.0)

## 2023-11-27 MED ORDER — BISACODYL 10 MG RE SUPP
10.0000 mg | Freq: Once | RECTAL | Status: AC
  Administered 2023-11-27: 10 mg via RECTAL
  Filled 2023-11-27: qty 1

## 2023-11-27 MED ORDER — TRAMADOL HCL 50 MG PO TABS
50.0000 mg | ORAL_TABLET | Freq: Four times a day (QID) | ORAL | Status: DC | PRN
  Administered 2023-11-27: 100 mg via ORAL
  Filled 2023-11-27: qty 2

## 2023-11-27 NOTE — Progress Notes (Signed)
   11/27/23 0000  BiPAP/CPAP/SIPAP  BiPAP/CPAP/SIPAP Pt Type Adult  Reason BIPAP/CPAP not in use Non-compliant

## 2023-11-27 NOTE — Progress Notes (Signed)
 Patient ID: Jeff [name], male   DOB: 05/07/1960, 63 y.o.   MRN: [id number]  1 Day Post-Op Subjective: [name] doing well.  No nausea or vomiting. Pain is adequately controlled.  Objective: Vital signs in last 24 hours: Temp:  [97.4 F (36.3 C)-98.9 F (37.2 C)] 97.4 F (36.3 C) (10/07 0304) Pulse Rate:  [70-86] 70 (10/07 0304) Resp:  [7-18] 18 (10/07 0304) BP: (132-166)/(89-114) 155/95 (10/07 0304) SpO2:  [92 %-100 %] 98 % (10/07 0304)  Intake/Output from previous day: 10/06 0701 - 10/07 0700 In: 5128.5 [P.O.:1320; I.V.:3258.5; IV Piggyback:550] Out: 2370 [Urine:2040; Drains:180; Blood:150] Intake/Output this shift: No intake/output data recorded.  Physical Exam:  General: Alert and oriented. CV: RRR Lungs: Clear bilaterally. GI: Soft, Nondistended. Incisions: Clean, dry, and intact Urine: Clear Extremities: Nontender, no erythema, no edema.  Lab Results: Recent Labs    11/26/23 0640 11/26/23 1125 11/27/23 0438  HGB 14.0 13.2 12.6*  HCT 43.0 42.3 39.9      Assessment/Plan: POD# 1 s/p robotic prostatectomy.  1) SL IVF 2) Ambulate, Incentive spirometry 3) Transition to oral pain medication 4) Dulcolax suppository 5) D/C pelvic drain 6) Plan for likely discharge later today   Jeff [name]. MD   LOS: 0 days   Jeff [name] 11/27/2023, 7:07 AM

## 2023-11-27 NOTE — Discharge Summary (Signed)
 Date of admission: 11/26/2023  Date of discharge: 11/27/2023  Admission diagnosis: Prostate Cancer  Discharge diagnosis: Prostate Cancer  History and Physical: For full details, please see admission history and physical. Briefly, Jeff [name] is a 63 y.o. gentleman with localized prostate cancer.  After discussing management/treatment options, he elected to proceed with surgical treatment.  Hospital Course: Jeff [name] was taken to the operating room on 11/26/2023 and underwent a robotic assisted laparoscopic radical prostatectomy. He tolerated this procedure well and without complications. Postoperatively, he was able to be transferred to a regular hospital room following recovery from anesthesia.  He was able to begin ambulating the night of surgery. He remained hemodynamically stable overnight.  He had excellent urine output with appropriately minimal output from his pelvic drain and his pelvic drain was removed on POD #1.  He was transitioned to oral pain medication, tolerated a clear liquid diet, and had met all discharge criteria and was able to be discharged home later on POD#1.  Laboratory values:  Recent Labs    11/26/23 0640 11/26/23 1125 11/27/23 0438  HGB 14.0 13.2 12.6*  HCT 43.0 42.3 39.9    Disposition: Home  Discharge instruction: He was instructed to be ambulatory but to refrain from heavy lifting, strenuous activity, or driving. He was instructed on urethral catheter care.  Discharge medications:   Allergies as of 11/27/2023       Reactions   Keflex [cephalexin] Rash        Medication List     TAKE these medications    allopurinol  100 MG tablet Commonly known as: ZYLOPRIM  Take 2 tablets (200 mg total) by mouth daily. What changed:  how much to take when to take this   carvedilol  3.125 MG tablet Commonly known as: COREG  TAKE 1 TABLET BY MOUTH TWICE A DAY WITH A MEAL   colchicine  0.6 MG tablet TAKE 1 TABLET BY MOUTH HOURLY UNTIL GOUT  IMPROVED/DIARRHEA,ON 1ST DAY OF TREATMENT,THEN 1 TAB DAILY THEREAFTER IF NEEDED   Cosopt PF 2-0.5 % Soln Generic drug: Dorzolamide HCl-Timolol Mal PF Place 1 drop into the right eye 2 (two) times daily.   docusate sodium 100 MG capsule Commonly known as: COLACE Take 1 capsule (100 mg total) by mouth 2 (two) times daily.   isosorbide  mononitrate 30 MG 24 hr tablet Commonly known as: IMDUR  Take 1 tablet (30 mg total) by mouth daily.   losartan  100 MG tablet Commonly known as: COZAAR  TAKE 1 TABLET BY MOUTH EVERY DAY   pantoprazole  40 MG tablet Commonly known as: PROTONIX  Take 1 tablet (40 mg total) by mouth 2 (two) times daily.   rosuvastatin  20 MG tablet Commonly known as: CRESTOR  Take 20 mg by mouth daily.   sertraline  50 MG tablet Commonly known as: ZOLOFT  Take 1 tablet (50 mg total) by mouth daily.   sildenafil  20 MG tablet Commonly known as: REVATIO  Take 20 mg by mouth 3 (three) times daily as needed (ED).   sulfamethoxazole-trimethoprim 800-160 MG tablet Commonly known as: BACTRIM DS Take 1 tablet by mouth 2 (two) times daily. Start the day prior to foley removal appointment   traMADol  50 MG tablet Commonly known as: ULTRAM  Take 1-2 tablets (50-100 mg total) by mouth every 6 (six) hours as needed for up to 5 days for moderate pain (pain score 4-6) or severe pain (pain score 7-10). What changed:  how much to take when to take this reasons to take this        Followup: He  will followup in 1 week for catheter removal and to discuss his surgical pathology results.

## 2023-11-30 LAB — SURGICAL PATHOLOGY

## 2023-12-01 ENCOUNTER — Other Ambulatory Visit: Payer: Self-pay | Admitting: Internal Medicine

## 2023-12-19 NOTE — Telephone Encounter (Unsigned)
 Copied from CRM [id number]. Topic: Clinical - Medication Refill >> Dec 19, 2023  8:52 AM Harlene ORN wrote: Medication: traMADol  (ULTRAM ) 50 MG tablet  Has the patient contacted their pharmacy? Yes (Agent: If no, request that the patient contact the pharmacy for the refill. If patient does not wish to contact the pharmacy document the reason why and proceed with request.) (Agent: If yes, when and what did the pharmacy advise?)  This is the patient's preferred pharmacy:  [id number] - [place], [place] - [address] AT [facility] [address] [place] [address] Phone: [phone] Fax: [phone]  Is this the correct pharmacy for this prescription? Yes If no, delete pharmacy and type the correct one.   Has the prescription been filled recently? No  Is the patient out of the medication? Yes  Has the patient been seen for an appointment in the last year OR does the patient have an upcoming appointment? No  Can we respond through MyChart? No  Agent: Please be advised that Rx refills may take up to 3 business days. We ask that you follow-up with your pharmacy.

## 2023-12-20 NOTE — Telephone Encounter (Unsigned)
 Copied from CRM [id number]. Topic: Clinical - Medication Refill >> Dec 19, 2023  8:52 AM Harlene ORN wrote: Medication: traMADol  (ULTRAM ) 50 MG tablet  Has the patient contacted their pharmacy? Yes (Agent: If no, request that the patient contact the pharmacy for the refill. If patient does not wish to contact the pharmacy document the reason why and proceed with request.) (Agent: If yes, when and what did the pharmacy advise?)  This is the patient's preferred pharmacy:  [id number] - [place], [place] - [address] AT [facility] [address] [place] [address] Phone: [phone] Fax: [phone]  Is this the correct pharmacy for this prescription? Yes If no, delete pharmacy and type the correct one.   Has the prescription been filled recently? No  Is the patient out of the medication? Yes  Has the patient been seen for an appointment in the last year OR does the patient have an upcoming appointment? No  Can we respond through MyChart? No  Agent: Please be advised that Rx refills may take up to 3 business days. We ask that you follow-up with your pharmacy. >> Dec 20, 2023  2:57 PM Carlyon D wrote: Pt is calling again in regards to medication. States the pharmacy still has not received anything. Pt is out of the medication. Please reach to pt on status update to prescription

## 2023-12-21 MED ORDER — TRAMADOL HCL 50 MG PO TABS: 50.0000 mg | ORAL_TABLET | Freq: Three times a day (TID) | ORAL | 5 refills | Status: AC | PRN

## 2023-12-21 NOTE — Telephone Encounter (Unsigned)
 Copied from CRM #[id number]. Topic: Clinical - Medication Question >> Dec 21, 2023  9:49 AM Anairis L wrote: Reason for CRM: Patient is calling regarding traMADol  (ULTRAM ) 50 MG tablet, please cal patient with an update.

## 2023-12-21 NOTE — Telephone Encounter (Signed)
 Pt is aware and gave a verbal understanding.

## 2023-12-21 NOTE — Telephone Encounter (Unsigned)
 Copied from CRM #[id number]. Topic: Clinical - Medication Question >> Dec 20, 2023  4:57 PM Shereese L wrote: Reason for CRM: Patient called in for the status of medication traMADol  (ULTRAM ) 50 MG tablet and stated that he's completely out of medication

## 2023-12-21 NOTE — Telephone Encounter (Signed)
 Jeff [name] called and said patient called and has been waiting to get his tremadol refilled and wanted to now why it has not been refilled. Please call patient.

## 2023-12-21 NOTE — Addendum Note (Signed)
 Addended by: Zein Helbing on: 12/21/2023 03:56 PM   Modules accepted: Orders

## 2023-12-21 NOTE — Telephone Encounter (Signed)
 Refilled: 12/01/2023 Last OV: 02/19/2023 Next OV: 02/26/2024

## 2023-12-24 NOTE — Telephone Encounter (Unsigned)
 Copied from CRM [id number]. Topic: Clinical - Prescription Issue >> Dec 21, 2023  5:03 PM Jeff [name] wrote: Reason for CRM: Patient stated that he's calling to check the status of medication traMADol  (ULTRAM ) 50 MG tablet He stated that its important for him to get a call back not a message on mychart

## 2023-12-26 NOTE — Telephone Encounter (Signed)
 LMTCB. Need to find out if pt was able to fill his Tramadol .

## 2024-01-14 ENCOUNTER — Other Ambulatory Visit: Payer: Self-pay | Admitting: Internal Medicine

## 2024-02-18 ENCOUNTER — Other Ambulatory Visit (HOSPITAL_COMMUNITY): Payer: Self-pay

## 2024-02-18 ENCOUNTER — Telehealth: Payer: Self-pay

## 2024-02-18 NOTE — Telephone Encounter (Signed)
 Pharmacy Patient Advocate Encounter   Received notification from Onbase that prior authorization for traMADol  (ULTRAM ) 50 MG tablet  is required/requested.   Insurance verification completed.   [name] insured through [employer].   Per test claim: PA required; PA submitted to above mentioned insurance via Latent Key/confirmation #/[id number] METLIFE Status is pending

## 2024-02-19 ENCOUNTER — Other Ambulatory Visit (HOSPITAL_COMMUNITY): Payer: Self-pay

## 2024-02-19 NOTE — Telephone Encounter (Signed)
 Pharmacy Patient Advocate Encounter  Received notification from [employer] that Prior Authorization for traMADol  (ULTRAM ) 50 MG tablet  has been APPROVED from 02/19/24 to 08/17/24   PA #/Case ID/Reference #: [id number]

## 2024-02-20 NOTE — Telephone Encounter (Signed)
 Pharmacy Patient Advocate Encounter  Received notification from [employer] that Prior Authorization for traMADol  HCl 50MG  tablets  has been APPROVED from 02/19/2024 to 08/17/2024   PA #/Case ID/Reference #: PA Case: [id number], Status: Approved, Coverage Starts on: 02/19/2024 12:00:00 AM, Coverage Ends on: 08/17/2024 12:00:00 AM. Authorization Expiration06/28/2026

## 2024-02-22 ENCOUNTER — Other Ambulatory Visit: Payer: Self-pay | Admitting: Internal Medicine

## 2024-02-26 ENCOUNTER — Ambulatory Visit: Admitting: Internal Medicine

## 2024-03-28 ENCOUNTER — Ambulatory Visit: Admitting: Internal Medicine

## 2024-04-25 ENCOUNTER — Ambulatory Visit: Admitting: Internal Medicine
